# Patient Record
Sex: Male | Born: 1975 | Race: White | Hispanic: No | Marital: Single
Health system: Southern US, Academic
[De-identification: ages and names within clinical notes are randomized; demographics above are authoritative.]

## PROBLEM LIST (undated history)

## (undated) ENCOUNTER — Encounter: Attending: Oncology | Primary: Oncology

## (undated) ENCOUNTER — Telehealth

## (undated) ENCOUNTER — Telehealth: Attending: Hematology & Oncology | Primary: Hematology & Oncology

## (undated) ENCOUNTER — Ambulatory Visit

## (undated) ENCOUNTER — Encounter: Attending: Hematology & Oncology | Primary: Hematology & Oncology

## (undated) ENCOUNTER — Encounter

## (undated) ENCOUNTER — Encounter: Attending: Adult Health | Primary: Adult Health

## (undated) ENCOUNTER — Encounter: Payer: PRIVATE HEALTH INSURANCE | Attending: Hematology & Oncology | Primary: Hematology & Oncology

## (undated) ENCOUNTER — Ambulatory Visit: Payer: PRIVATE HEALTH INSURANCE

## (undated) ENCOUNTER — Ambulatory Visit: Attending: Surgery | Primary: Surgery

## (undated) ENCOUNTER — Encounter: Payer: PRIVATE HEALTH INSURANCE | Attending: Oncology | Primary: Oncology

## (undated) ENCOUNTER — Ambulatory Visit: Payer: PRIVATE HEALTH INSURANCE | Attending: Clinical | Primary: Clinical

## (undated) ENCOUNTER — Ambulatory Visit: Payer: PRIVATE HEALTH INSURANCE | Attending: Hematology & Oncology | Primary: Hematology & Oncology

## (undated) ENCOUNTER — Ambulatory Visit: Attending: Hematology & Oncology | Primary: Hematology & Oncology

## (undated) DIAGNOSIS — F32A Depression, unspecified: Secondary | ICD-10-CM

## (undated) DIAGNOSIS — Z87442 Personal history of urinary calculi: Secondary | ICD-10-CM

## (undated) DIAGNOSIS — F329 Major depressive disorder, single episode, unspecified: Secondary | ICD-10-CM

## (undated) DIAGNOSIS — K115 Sialolithiasis: Secondary | ICD-10-CM

## (undated) DIAGNOSIS — C801 Malignant (primary) neoplasm, unspecified: Secondary | ICD-10-CM

## (undated) DIAGNOSIS — K219 Gastro-esophageal reflux disease without esophagitis: Secondary | ICD-10-CM

## (undated) DIAGNOSIS — R55 Syncope and collapse: Secondary | ICD-10-CM

## (undated) DIAGNOSIS — M1990 Unspecified osteoarthritis, unspecified site: Secondary | ICD-10-CM

## (undated) DIAGNOSIS — H9313 Tinnitus, bilateral: Secondary | ICD-10-CM

## (undated) DIAGNOSIS — Z9289 Personal history of other medical treatment: Secondary | ICD-10-CM

## (undated) HISTORY — PX: LUNG REMOVAL, PARTIAL: SHX233

## (undated) HISTORY — PX: OTHER SURGICAL HISTORY: SHX169

## (undated) HISTORY — PX: NECK SURGERY: SHX720

## (undated) HISTORY — DX: Malignant (primary) neoplasm, unspecified: C80.1

---

## 1998-10-13 ENCOUNTER — Emergency Department (HOSPITAL_COMMUNITY): Admission: EM | Admit: 1998-10-13 | Discharge: 1998-10-13 | Payer: Self-pay | Admitting: Emergency Medicine

## 1998-10-14 ENCOUNTER — Encounter: Payer: Self-pay | Admitting: Emergency Medicine

## 2014-02-19 ENCOUNTER — Emergency Department (HOSPITAL_COMMUNITY)
Admission: EM | Admit: 2014-02-19 | Discharge: 2014-02-19 | Disposition: A | Discharge status: Home / Self Care | Payer: 59 | Attending: Emergency Medicine | Admitting: Emergency Medicine

## 2014-02-19 ENCOUNTER — Encounter (HOSPITAL_COMMUNITY): Payer: Self-pay | Admitting: Emergency Medicine

## 2014-02-19 ENCOUNTER — Telehealth: Payer: Self-pay | Admitting: Family Medicine

## 2014-02-19 ENCOUNTER — Emergency Department (HOSPITAL_COMMUNITY): Payer: 59

## 2014-02-19 DIAGNOSIS — R220 Localized swelling, mass and lump, head: Secondary | ICD-10-CM | POA: Diagnosis not present

## 2014-02-19 DIAGNOSIS — R55 Syncope and collapse: Secondary | ICD-10-CM | POA: Diagnosis not present

## 2014-02-19 DIAGNOSIS — R079 Chest pain, unspecified: Secondary | ICD-10-CM | POA: Diagnosis present

## 2014-02-19 DIAGNOSIS — Z87891 Personal history of nicotine dependence: Secondary | ICD-10-CM | POA: Insufficient documentation

## 2014-02-19 LAB — CBC WITH DIFFERENTIAL/PLATELET
BASOS ABS: 0 10*3/uL (ref 0.0–0.1)
Basophils Relative: 0 % (ref 0–1)
Eosinophils Absolute: 0 10*3/uL (ref 0.0–0.7)
Eosinophils Relative: 0 % (ref 0–5)
HCT: 41 % (ref 39.0–52.0)
HEMOGLOBIN: 14 g/dL (ref 13.0–17.0)
LYMPHS PCT: 22 % (ref 12–46)
Lymphs Abs: 1.4 10*3/uL (ref 0.7–4.0)
MCH: 28.8 pg (ref 26.0–34.0)
MCHC: 34.1 g/dL (ref 30.0–36.0)
MCV: 84.4 fL (ref 78.0–100.0)
Monocytes Absolute: 0.7 10*3/uL (ref 0.1–1.0)
Monocytes Relative: 10 % (ref 3–12)
NEUTROS ABS: 4.3 10*3/uL (ref 1.7–7.7)
Neutrophils Relative %: 68 % (ref 43–77)
Platelets: 196 10*3/uL (ref 150–400)
RBC: 4.86 MIL/uL (ref 4.22–5.81)
RDW: 12.9 % (ref 11.5–15.5)
WBC: 6.4 10*3/uL (ref 4.0–10.5)

## 2014-02-19 LAB — COMPREHENSIVE METABOLIC PANEL
ALT: 22 U/L (ref 0–53)
ANION GAP: 13 (ref 5–15)
AST: 28 U/L (ref 0–37)
Albumin: 4.4 g/dL (ref 3.5–5.2)
Alkaline Phosphatase: 74 U/L (ref 39–117)
BILIRUBIN TOTAL: 0.6 mg/dL (ref 0.3–1.2)
BUN: 16 mg/dL (ref 6–23)
CHLORIDE: 103 meq/L (ref 96–112)
CO2: 26 meq/L (ref 19–32)
Calcium: 9.4 mg/dL (ref 8.4–10.5)
Creatinine, Ser: 0.89 mg/dL (ref 0.50–1.35)
GFR calc Af Amer: >90 mL/min (ref >90)
GFR calc non Af Amer: >90 mL/min (ref >90)
GLUCOSE: 102 mg/dL — AB (ref 70–99)
Potassium: 3.5 mEq/L — ABNORMAL LOW (ref 3.7–5.3)
SODIUM: 142 meq/L (ref 137–147)
Total Protein: 7.5 g/dL (ref 6.0–8.3)

## 2014-02-19 LAB — TROPONIN I: Troponin I: <0.3 ng/mL (ref <0.30)

## 2014-02-19 LAB — D-DIMER, QUANTITATIVE: D-Dimer, Quant: <0.27 ug/mL-FEU (ref 0.00–0.48)

## 2014-02-19 MED ORDER — SODIUM CHLORIDE 0.9 % IV BOLUS (SEPSIS)
1000.0000 mL | Freq: Once | INTRAVENOUS | Status: AC
  Administered 2014-02-19: 1000 mL via INTRAVENOUS

## 2014-02-19 MED ORDER — IOHEXOL 300 MG/ML  SOLN
80.0000 mL | Freq: Once | INTRAMUSCULAR | Status: AC | PRN
  Administered 2014-02-19: 80 mL via INTRAVENOUS

## 2014-02-19 NOTE — Discharge Instructions (Signed)
Driving and Equipment Restrictions Some medical problems make it dangerous to drive, ride a bike, or use machines. Some of these problems are:  A hard blow to the head (concussion).  Passing out (fainting).  Twitching and shaking (seizures).  Low blood sugar.  Taking medicine to help you relax (sedatives).  Taking pain medicines.  Wearing an eye patch.  Wearing splints. This can make it hard to use parts of your body that you need to drive safely. HOME CARE   Do not drive until your doctor says it is okay.  Do not use machines until your doctor says it is okay. You may need a form signed by your doctor (medical release) before you can drive again. You may also need this form before you do other tasks where you need to be fully alert. MAKE SURE YOU:  Understand these instructions.  Will watch your condition.  Will get help right away if you are not doing well or get worse. Document Released: 06/03/2004 Document Revised: 07/19/2011 Document Reviewed: 09/03/2009 [address] [facility] Patient Information 2015 [place], [place]. This information is not intended to replace advice given to you by your health care provider. Make sure you discuss any questions you have with your health care provider.  Syncope Syncope is a medical term for fainting or passing out. This means you lose consciousness and drop to the ground. People are generally unconscious for less than 5 minutes. You may have some muscle twitches for up to 15 seconds before waking up and returning to normal. Syncope occurs more often in older adults, but it can happen to anyone. While most causes of syncope are not dangerous, syncope can be a sign of a serious medical problem. It is important to seek medical care.  CAUSES  Syncope is caused by a sudden drop in blood flow to the brain. The specific cause is often not determined. Factors that can bring on syncope include:  Taking medicines that lower blood pressure.  Sudden changes in  posture, such as standing up quickly.  Taking more medicine than prescribed.  Standing in one place for too long.  Seizure disorders.  Dehydration and excessive exposure to heat.  Low blood sugar (hypoglycemia).  Straining to have a bowel movement.  Heart disease, irregular heartbeat, or other circulatory problems.  Fear, emotional distress, seeing blood, or severe pain. SYMPTOMS  Right before fainting, you may:  Feel dizzy or light-headed.  Feel nauseous.  See all white or all black in your field of vision.  Have cold, clammy skin. DIAGNOSIS  Your health care provider will ask about your symptoms, perform a physical exam, and perform an electrocardiogram (ECG) to record the electrical activity of your heart. Your health care provider may also perform other heart or blood tests to determine the cause of your syncope which may include:  Transthoracic echocardiogram (TTE). During echocardiography, sound waves are used to evaluate how blood flows through your heart.  Transesophageal echocardiogram (TEE).  Cardiac monitoring. This allows your health care provider to monitor your heart rate and rhythm in real time.  Holter monitor. This is a portable device that records your heartbeat and can help diagnose heart arrhythmias. It allows your health care provider to track your heart activity for several days, if needed.  Stress tests by exercise or by giving medicine that makes the heart beat faster. TREATMENT  In most cases, no treatment is needed. Depending on the cause of your syncope, your health care provider may recommend changing or stopping some of  your medicines. HOME CARE INSTRUCTIONS  Have someone stay with you until you feel stable.  Do not drive, use machinery, or play sports until your health care provider says it is okay.  Keep all follow-up appointments as directed by your health care provider.  Lie down right away if you start feeling like you might faint.  Breathe deeply and steadily. Wait until all the symptoms have passed.  Drink enough fluids to keep your urine clear or pale yellow.  If you are taking blood pressure or heart medicine, get up slowly and take several minutes to sit and then stand. This can reduce dizziness. SEEK IMMEDIATE MEDICAL CARE IF:   You have a severe headache.  You have unusual pain in the chest, abdomen, or back.  You are bleeding from your mouth or rectum, or you have black or tarry stool.  You have an irregular or very fast heartbeat.  You have pain with breathing.  You have repeated fainting or seizure-like jerking during an episode.  You faint when sitting or lying down.  You have confusion.  You have trouble walking.  You have severe weakness.  You have vision problems. If you fainted, call your local emergency services (911 in U.S[place] Do not drive yourself to the hospital.  MAKE SURE YOU:  Understand these instructions.  Will watch your condition.  Will get help right away if you are not doing well or get worse. Document Released: 04/26/2005 Document Revised: 05/01/2013 Document Reviewed: 06/25/2011 [facility] Patient Information 2015 [place], [place]. This information is not intended to replace advice given to you by your health care provider. Make sure you discuss any questions you have with your health care provider.    Emergency Department Resource Guide 1) Find a Doctor and Pay Out of Pocket Although you won't have to find out who is covered by your insurance plan, it is a good idea to ask around and get recommendations. You will then need to call the office and see if the doctor you have chosen will accept you as a new patient and what types of options they offer for patients who are self-pay. Some doctors offer discounts or will set up payment plans for their patients who do not have insurance, but you will need to ask so you aren't surprised when you get to your appointment.  2) Contact  Your Local Health Department Not all health departments have doctors that can see patients for sick visits, but many do, so it is worth a call to see if yours does. If you don't know where your local health department is, you can check in your phone book. The CDC also has a tool to help you locate your state's health department, and many state websites also have listings of all of their local health departments.  3) Find a [facility] If your illness is not likely to be very severe or complicated, you may want to try a walk in clinic. These are popping up all over the country in pharmacies, drugstores, and shopping centers. They're usually staffed by nurse practitioners or physician assistants that have been trained to treat common illnesses and complaints. They're usually fairly quick and inexpensive. However, if you have serious medical issues or chronic medical problems, these are probably not your best option.  No Primary Care Doctor: - Call Health Connect at  [phone] - they can help you locate a primary care doctor that  accepts your insurance, provides certain services, etc. - Physician Referral Service- [phone]  Chronic Pain Problems: Organization         Address  Phone   Notes  [facility]  [phone] Patients need to be referred by their primary care doctor.   Medication Assistance: Organization         Address  Phone   Notes  [facility] Medication [facility] [place] [place], [place] [address] [phone] --Must be a resident of [facility] -- Must have NO insurance coverage whatsoever (no Medicaid/ Medicare, etc.) -- The pt. MUST have a primary care doctor that directs their care regularly and follows them in the community   MedAssist  [phone]   [employer]  [phone]    Agencies that provide inexpensive medical care: Organization         Address  Phone   Notes  [place]  [phone]   [facility]    [phone]   [facility] [place], [place] [address] [phone]   [place] [address] [place] [phone]   Planned Parenthood    [phone]   [facility]    [phone]   [address] [address], [place] Phone:  [phone], Fax:  [phone] Hours of Operation:  9 am - 6 pm, M-F.  Also accepts Medicaid/Medicare and self-pay.  [facility] for [place], [address], [place] Phone: [phone], Fax: [phone]. Hours of Operation:  8:30 am - 5:30 pm, M-F.  Also accepts Medicaid and self-pay.  [facility] [place] [address] [place] Phone: [phone]   [place], [place], [place] [phone], Ext. [phone] Mondays & Thursdays: 7-9 AM.  First 15 patients are seen on a first come, first serve basis.    [place] Providers:  Organization         Address  Phone   Notes  [facility] [address], [place] [phone] Also accepts self-pay patients.  [facility] [address], [place]  [phone]   [place], [address], [place] [phone]   [facility] [address] [place] [phone]   Lucianne Lei [address], [place]   [phone] Only accepts [place] Access [place] patients after they have their name applied to their card.   Self-Pay (no insurance) in [facility]:  Organization         Address  Phone   Notes  Sickle Cell Patients, [facility] [place] [phone]   [facility] [place] [phone]   [facility] [place]  [place],  [place], [place] [phone]   [facility]/Dr. Osei-Bonsu  [address] [place] or [address], [place] [phone] Phone number for both [place] and [place] locations is the same.  Urgent Medical and [facility] [address] [place] [phone]   [facility] [address] [place] or [address] [phone]   [facility] [address], [place] [phone], phone; [phone], fax Sees  patients 1st and 3rd Saturday of every month.  Must not qualify for public or private insurance (i.e. Medicaid, Medicare, [place] Health Choice, Veterans' Benefits)  Household income should be no more than 200% of the poverty level The clinic cannot treat you if you are pregnant or think you are pregnant  Sexually transmitted diseases are not treated at the clinic.    Dental Care: Organization         Address  Phone  Notes  [facility] Department of [place] [place] [phone] Accepts children up to age 5 who are enrolled in [place] or [place]; pregnant women with a Medicaid card; and children who have applied for Medicaid or [place] Health Choice, but were declined, whose parents can pay a reduced fee at time of service.  [facility] Department of [facility]  [address], [place] [phone] Accepts children up to age 47 who are enrolled in [place] or [place]; pregnant women with a Medicaid card; and children who have applied for Medicaid or [place] Health Choice, but were declined, whose parents can pay a reduced fee at time of service.  [place] Adult Dental Access PROGRAM  [place] [phone] Patients are seen by appointment only. Walk-ins are not accepted. [place] will see patients 97 years of age and older. Monday - Tuesday (8am-5pm) Most  Wednesdays (8:30-5pm) $30 per visit, cash only  [facility] Adult Dental Access PROGRAM  [address], [facility] [phone] Patients are seen by appointment only. Walk-ins are not accepted. [place] will see patients 33 years of age and older. One Wednesday Evening (Monthly: Volunteer Based).  $30 per visit, cash only  [place]  [phone] for adults; Children under age 38, call Graduate Pediatric Dentistry at [phone]. Children aged 63-14, please call [phone] to request a pediatric application.  Dental services are provided in all areas of dental care including fillings, crowns and bridges, complete and partial dentures, implants, gum treatment, root canals, and extractions. Preventive care is also provided. Treatment is provided to both adults and children. Patients are selected via a lottery and there is often a waiting list.   [facility] [address], [place]  [phone] [email or web address]   [facility] [address] [place], [place] [phone], Ext. [phone] Second and Fourth Thursday of each month, opens at 6:30 AM; Clinic ends at 9 AM.  Patients are seen on a first-come first-served basis, and a limited number are seen during each clinic.   [facility]  [address] Hillard Danker [place], [place] [phone]   Eligibility Requirements You must have lived in [place], [place], or [place] counties for at least the last three months.   You cannot be eligible for state or federal sponsored [employer], including [employer], [place], or [employer].   You generally cannot be eligible for healthcare insurance through your employer.    How to apply: Eligibility screenings are held every Tuesday and Wednesday afternoon from 1:00 pm until 4:00 pm. You do not need an appointment for the interview!  [facility] [address], [place], [place]     [place]  [place] Department  [place]  [phone]    Behavioral Health Resources in the Community: Intensive Outpatient  Development worker, international aid Health Services [address] [place], [place] [phone]   [facility] Outpatient [address] [place], [place]   ADS: Alcohol & Drug Svcs [address] [place] Barrera, [place]   [place] [address]  [place], [place] or [phone]   Substance Abuse Resources Organization         Address  Phone  Notes  Alcohol and Drug Services  [phone]   [place]  [phone]   The [place]   Chinita Pester  [phone]   Residential & Outpatient Substance Abuse Program  [phone]   Psychological Services Organization         Address  Phone  Notes  [facility] [place]  [place]  [phone]   [place] [address], [place] or [phone]    Mobile Crisis Teams Organization         Address  Phone  Notes  Therapeutic Alternatives, Mobile Crisis Care Unit  [phone]   [facility]ive Psychotherapeutic Services  [address] [place], [place]   [place] [address], [place] [phone]    Self-Help/Support Groups Organization         Address  Phone             Notes  [place]. of [place] - variety of support groups  [place] Call for more information  Narcotics Anonymous (NA), Caring Services [address], [employer] [place]  2 meetings at this location   Special educational needs teacher         Address  Phone  Notes  [facility] Residential Treatment [place],    [place]  [phone]   [facility]  [address] [place]  [phone], [place], [place]   [place], [place] [phone] Admissions: 8am-3pm M-F  Incentives Substance [place] [address]    [place], [place] [phone]   [facility] [address] [place], [place], [place]   [facility] [address]  [place], [place]   [facility]ight Programs - Intensive Outpatient [address] Kristeen [address], [place], [place]   [facility].,  [place], [place] [phone] or [phone]   [facility]idential Treatment Services (RTS) [address] [place], [place] Accepts Medicaid  [place] [address],  [place] [phone] Substance Abuse/Addiction Treatment   [facility] Organization         Address  Phone  Notes  [facility]  [phone]   Domenic Schwab, PhD [address] Arlis [place], [place]   [phone] or [phone]   [place], [place] [phone]   [place] [address], [place], [place] [phone] Insurance/Medicaid/sponsorship through [facility] and Families [address], [place]                                    [place], [place] [phone] [place] [address], [place] [phone]    Dr. Adele Schilder  (  [phone]   [facility] [place] Dept. 1) [address] [place] 2) [place] 3)  [address], [place] [phone]  [phone]   [place] [phone] or [phone] (After Hours)

## 2014-02-19 NOTE — ED Notes (Signed)
Pt c/oleft sided sharp cp today. Pt states he was bent over at work and had cp and confusion at 0900 this am. Denies visual disturbances/weakness. Pt alert and oriented x 4 with No neuro deficits at present. Pt also reports "blacking out" several times over the past year and right submandibular swollen lymph nodes x 4-5 months. Pt does not have pcp.

## 2014-02-19 NOTE — ED Provider Notes (Signed)
This chart was scribed for [place], DO by Edison Simon, ED Scribe. This patient was seen in room APA05/APA05.  TIME SEEN: 1204  CHIEF COMPLAINT: syncope  HPI: [name] is a 38 y.o. male with history of prior tobacco use who presents to the Emergency Department complaining of intermittent syncopal episodes 5-6 times this past year. He attributes 2 of these episodes to alcohol use. He states there is sometimes associated dizziness. He states that 3 nights ago, he woke at 0300 in the middle of his hallway and that the last thing he remembers before that is going to bed. He denies alcohol or drug use. Today, he states he kneeled down today to make some measurements at his work like he had been doing previously and suddenly became confused; he states he was not able to remember who he was or who his coworkers were and became confrontational. He recalls the episode now. He states it lasted no more than several minutes. He states his coworkers say he did not pass out or have seizure-like activity. He reports occasional associated sharp chest pain described like a "needle." He states nothing makes it worse or better including deep breathing and exertion. He reports some nausea.  He also reports a painful lump in his neck that has been present for 6 months. No history of PE or DVT, recent prolonged immobilization such as flight or hospitalization, fracture, surgery, trauma. He denies SOB, vomiting, diarrhea, cough, or fever. No bloody stool or melena. No numbness, tingling or focal weakness. No headache.   ROS: See HPI Constitutional: no fever  Eyes: no drainage  ENT: no runny nose, painful lump in his neck Cardiovascular:  chest pain  Resp: no SOB  GI: no vomiting, nausea GU: no dysuria Integumentary: no rash  Allergy: no hives  Musculoskeletal: bilateral leg swelling  Neurological: no slurred speech, syncope, confusion, dizziness ROS otherwise negative  PAST MEDICAL HISTORY/PAST SURGICAL  HISTORY:  History reviewed. No pertinent past medical history.  MEDICATIONS:  Prior to Admission medications   Not on File    ALLERGIES:  No Known Allergies  SOCIAL HISTORY:  History  Substance Use Topics  . Smoking status: Former Research scientist (life sciences)  . Smokeless tobacco: Not on file  . Alcohol Use: Yes    FAMILY HISTORY: No family history on file.  EXAM: BP 135/86  Pulse 75  Temp(Src) 98.7 F (37.1 C)  Resp 15  Ht 6' (1.829 m)  Wt 190 lb (86.183 kg)  BMI 25.76 kg/m2  SpO2 99% CONSTITUTIONAL: Alert and oriented and responds appropriately to questions. Well-appearing; well-nourished, nontoxic, smiling, pleasant HEAD: Normocephalic EYES: Conjunctivae clear, PERRL ENT: normal nose; no rhinorrhea; moist mucous membranes; pharynx without lesions noted NECK: Supple, no meningismus, non-moveable hard mass in the right submandibular area with no associated erythema or warmth or induration CARD: RRR; S1 and S2 appreciated; no murmurs, no clicks, no rubs, no gallops RESP: Normal chest excursion without splinting or tachypnea; breath sounds clear and equal bilaterally; no wheezes, no rhonchi, no rales,  ABD/GI: Normal bowel sounds; non-distended; soft, non-tender, no rebound, no guarding BACK:  The back appears normal and is non-tender to palpation, there is no CVA tenderness EXT: Normal ROM in all joints; non-tender to palpation; no edema; normal capillary refill; no cyanosis    SKIN: Normal color for age and race; warm NEURO: Moves all extremities equally, strength is 5/5 in all 4 extremities, sensation to fine touch normal diffusely, cranial nerves 2-12 intact, no dysmetrea in finger  to nose testing PSYCH: The patient's mood and manner are appropriate. Grooming and personal hygiene are appropriate.  MEDICAL DECISION MAKING: Patient here with episodes that he describes the syncopal events. He states he is not sure if he loses consciousness but on several occasions has become very confused.  There was no seizure-like activity during these episodes and there was no postictal period. Most of these episodes are not preceded by any symptoms but he did have some left chest pain that sounds very atypical after this episode today. He is currently neurologically intact. No recent head injury. No cardiac risk factors. He is PERC negative.  Labs ordered in triage been unremarkable including normal electrolytes, H./H., troponin. Chest x-ray is clear with normal cardiac silhouette. EKG shows no ischemic changes, interval changes, arrhythmia, delta wave, prolonged QT, [place].  Given he did have some chest pain after this event, will obtain d-dimer to rule out pulmonary embolus given he is low risk. We'll also obtain a head CT given there are some episodes of confusion but he is currently neurologically intact. We'll obtain a CT of his neck in this right submandibular mass.   ED PROGRESS: Patient's labs are unremarkable. Head CT negative. CT of his neck shows a swollen right submandibular salivary gland with stones likely inflammatory but malignancy cannot be occluded. We'll have him followup with ENT for further evaluation. No sign of infection. No fever or leukocytosis. D-dimer is negative. Discussed with patient I recommend he followup with a primary care physician who may refer him to cardiology versus neurology for further workup. Have advised him to not drive or operate machinery until he has been cleared. His events are very atypical to discuss with him he may need an outpatient echocardiogram versus MRI and EEG. He has no risk factors for ACS or pulmonary embolus. No family history of seizures. Have discussed return precautions and supportive care instructions. He verbalizes understanding and is comfortable with plan.      EKG Interpretation  Date/Time:  Tuesday February 19 2014 10:54:46 EDT Ventricular Rate:  72 PR Interval:  148 QRS Duration: 96 QT Interval:  402 QTC Calculation: 440 R  Axis:   78 Text Interpretation:  Sinus rhythm Confirmed by WARD,  DO, KRISTEN ([id number]) on 02/19/2014 11:24:55 AM          I personally performed the services described in this documentation, which was scribed in my presence. The recorded information has been reviewed and is accurate.    [place], DO 02/19/14 [phone]

## 2014-02-19 NOTE — ED Notes (Signed)
MD at bedside. 

## 2014-02-20 ENCOUNTER — Encounter: Payer: Self-pay | Admitting: Family

## 2014-02-20 ENCOUNTER — Ambulatory Visit (INDEPENDENT_AMBULATORY_CARE_PROVIDER_SITE_OTHER): Payer: 59 | Admitting: Family

## 2014-02-20 VITALS — BP 117/71 | HR 67 | Temp 97.2°F | Ht 72.0 in | Wt 195.6 lb

## 2014-02-20 DIAGNOSIS — R600 Localized edema: Secondary | ICD-10-CM

## 2014-02-20 DIAGNOSIS — R609 Edema, unspecified: Secondary | ICD-10-CM

## 2014-02-20 DIAGNOSIS — Z23 Encounter for immunization: Secondary | ICD-10-CM

## 2014-02-20 DIAGNOSIS — R55 Syncope and collapse: Secondary | ICD-10-CM

## 2014-02-20 DIAGNOSIS — K111 Hypertrophy of salivary gland: Secondary | ICD-10-CM

## 2014-02-20 DIAGNOSIS — Z09 Encounter for follow-up examination after completed treatment for conditions other than malignant neoplasm: Secondary | ICD-10-CM

## 2014-02-20 NOTE — Patient Instructions (Signed)

## 2014-02-20 NOTE — Telephone Encounter (Signed)
appt scheduled for today with [name]

## 2014-02-20 NOTE — Progress Notes (Signed)
   Subjective:    Patient ID: [name], male    DOB: 1976-04-17, 38 y.o.   MRN: [id number]  HPI Pt presents to the office to establish care and for hospital follow-up for intermittent syncopal episodes 5-6 times this past year. Pt states he was told to get a referral to a neurologists to rule out seizure activity. PT also had a CT done that shown a right submandibular salivary gland with stones and to r/u out malignancy.  *ED notes reviewed    Review of Systems  Constitutional: Negative.   HENT: Negative.   Respiratory: Negative.   Cardiovascular: Negative.   Gastrointestinal: Negative.   Endocrine: Negative.   Genitourinary: Negative.   Musculoskeletal: Negative.   Neurological: Negative.   Hematological: Negative.   Psychiatric/Behavioral: Negative.   All other systems reviewed and are negative.      Objective:   Physical Exam  Vitals reviewed. Constitutional: He is oriented to person, place, and time. He appears well-developed and well-nourished. No distress.  HENT:  Head: Normocephalic.  Right Ear: External ear normal.  Left Ear: External ear normal.  Mouth/Throat: Oropharynx is clear and moist.  Eyes: Pupils are equal, round, and reactive to light. Right eye exhibits no discharge. Left eye exhibits no discharge.  Neck: Normal range of motion. Neck supple. No thyromegaly present.  Cardiovascular: Normal rate, regular rhythm, normal heart sounds and intact distal pulses.   No murmur heard. Pulmonary/Chest: Effort normal and breath sounds normal. No respiratory distress. He has no wheezes.  Abdominal: Soft. Bowel sounds are normal. He exhibits no distension. There is no tenderness.  Musculoskeletal: Normal range of motion. He exhibits no edema and no tenderness.  Lymphadenopathy:    He has cervical adenopathy (right swollen gland).  Neurological: He is alert and oriented to person, place, and time. He has normal reflexes. No cranial nerve deficit.  Skin: Skin is  warm and dry. No rash noted. No erythema.  Psychiatric: He has a normal mood and affect. His behavior is normal. Judgment and thought content normal.    BP 117/71  Pulse 67  Temp(Src) 97.2 F (36.2 C) (Oral)  Ht 6' (1.829 m)  Wt 195 lb 9.6 oz (88.724 kg)  BMI 26.52 kg/m2       Assessment & Plan:  1. Syncope, unspecified syncope type - Ambulatory referral to Neurology  2. Salivary gland swelling - Ambulatory referral to ENT  3. Hospital discharge follow-up  -Pt to keep all appointments with specialists  -RTO prn -Falls precaution discussed  Evelina Dun, FNP

## 2014-02-20 NOTE — Addendum Note (Signed)
Addended by: Priscille Heidelberg on: 02/20/2014 02:50 PM   Modules accepted: Orders

## 2014-02-21 ENCOUNTER — Other Ambulatory Visit: Payer: Self-pay

## 2014-02-21 ENCOUNTER — Ambulatory Visit (HOSPITAL_COMMUNITY)
Admission: RE | Admit: 2014-02-21 | Discharge: 2014-02-21 | Disposition: A | Discharge status: Home / Self Care | Payer: 59 | Source: Ambulatory Visit | Attending: Pulmonary Disease | Admitting: Pulmonary Disease

## 2014-02-21 ENCOUNTER — Ambulatory Visit (INDEPENDENT_AMBULATORY_CARE_PROVIDER_SITE_OTHER): Payer: 59 | Admitting: Cardiovascular Disease

## 2014-02-21 ENCOUNTER — Encounter: Payer: Self-pay | Admitting: Cardiovascular Disease

## 2014-02-21 VITALS — BP 112/80 | HR 58 | Ht 72.0 in | Wt 190.0 lb

## 2014-02-21 DIAGNOSIS — R55 Syncope and collapse: Secondary | ICD-10-CM | POA: Insufficient documentation

## 2014-02-21 DIAGNOSIS — I517 Cardiomegaly: Secondary | ICD-10-CM

## 2014-02-21 DIAGNOSIS — R410 Disorientation, unspecified: Secondary | ICD-10-CM

## 2014-02-21 DIAGNOSIS — R079 Chest pain, unspecified: Secondary | ICD-10-CM | POA: Insufficient documentation

## 2014-02-21 DIAGNOSIS — Z87891 Personal history of nicotine dependence: Secondary | ICD-10-CM | POA: Diagnosis not present

## 2014-02-21 NOTE — Patient Instructions (Signed)
Your physician recommends that you schedule a follow-up appointment in: December after you have your EEG with Neurology   Your physician has requested that you have en exercise stress myoview. For further information please visit [email or web address]. Please follow instruction sheet, as given.         Thank you for choosing [facility] !

## 2014-02-21 NOTE — Progress Notes (Signed)
Patient ID: [name], male   DOB: 02-24-1976, 38 y.o.   MRN: [id number]       CARDIOLOGY CONSULT NOTE  Patient ID: [name] MRN: [id number] DOB/AGE: Jul 27, 1975 38 y.o.  Admit date: (Not on file) Primary Physician Redge Gainer, MD  Reason for Consultation: Syncope and chest pain  HPI: The patient is a 38 yr old male who recently starting seeing a primary care provider yesterday. He presented to the ED on 02/19/14 after sustaining a presyncopal episode associated with confusion. As per ED notes, he has had several presyncopal episodes within the past month. There is occasionally associated dizziness, but no associated chest pain or shortness of breath. He can tell when it is going to happen as he becomes diaphoretic shortly beforehand. The last time he completely lost consciousness was over a year ago. On the day of ED presentation, he remembered kneeling down at work to take some measurements and suddenly became confused, unable to remember who he was or who his coworkers were, and became confrontational. A head CT was performed which showed no acute intracranial pathology. There was no witnessed seizure activity and he did not lose consciousness at that time. He has also been experiencing sharp chest pain, not aggravated by exertion or inspiration. It is usually associated with nausea, but not vomiting, palpitations, lightheadedness or shortness of breath. The pain is located at the LV apex without radiation to the shoulder, neck, jaw, back or arm. He has had a painful swelling in his neck. CT neck at time of ED presentation demonstrated the following: "Swollen right submandibular gland containing prominent calcifications consistent with submandibular gland stones. Submandibular gland enlargement is most likely inflammatory. Malignancy cannot be entirely excluded." He saw an ENT specialist earlier today who said they are typical stones and did not appear to be malignant, and plans to  surgically excise the gland. ECG demonstrated normal sinus rhythm in the ED. Chest xray was unremarkable.  Echocardiogram performed today demonstrated normal LV systolic and diastolic function and regional wall motion, with normal valvular structures.  He had been drinking a Monster beverage every day but quit in the past 48 hours. He eats a Clif bar for breakfast and has tried to eat a healthy diet in the past year.  Soc: Adopted. Works as a Government social research officer. Used to smoke up to 1 ppd for 10 years but quit 3 years ago. He vapes. Denies drug use.  No Known Allergies  Current Outpatient Prescriptions  Medication Sig Dispense Refill  . fexofenadine (ALLEGRA) 180 MG tablet Take 180 mg by mouth daily. OTC      . ibuprofen (ADVIL,MOTRIN) 200 MG tablet Take 400 mg by mouth every 6 (six) hours as needed for moderate pain.      . Multiple Vitamin (MULTIVITAMIN WITH MINERALS) TABS tablet Take 1 tablet by mouth daily.       No current facility-administered medications for this visit.    No past medical history on file.  No past surgical history on file.  History   Social History  . Marital Status: Married    Spouse Name: N/A    Number of Children: N/A  . Years of Education: N/A   Occupational History  . Not on file.   Social History Main Topics  . Smoking status: Former Research scientist (life sciences)  . Smokeless tobacco: Not on file  . Alcohol Use: Yes  . Drug Use: Yes    Special: Marijuana  . Sexual Activity: Not on file  Other Topics Concern  . Not on file   Social History Narrative  . No narrative on file     No family history of premature CAD in 1st degree relatives.  Prior to Admission medications   Medication Sig Start Date End Date Taking? Authorizing Provider  fexofenadine (ALLEGRA) 180 MG tablet Take 180 mg by mouth daily. OTC    Historical Provider, MD  ibuprofen (ADVIL,MOTRIN) 200 MG tablet Take 400 mg by mouth every 6 (six) hours as needed for moderate pain.    Historical Provider, MD   Multiple Vitamin (MULTIVITAMIN WITH MINERALS) TABS tablet Take 1 tablet by mouth daily.    Historical Provider, MD     Review of systems complete and found to be negative unless listed above in HPI   BP 112/80  Pulse 58  Weight 190 lb (86.183 kg) Height 6' (1.829 m)   Physical exam General: NAD Neck: No JVD, no thyromegaly or thyroid nodule.  Lungs: Clear to auscultation bilaterally with normal respiratory effort. CV: Nondisplaced PMI. Regular rate and rhythm, normal S1/S2, no S3/S4, no murmur.  No peripheral edema.  No carotid bruit.  Normal pedal pulses.  Abdomen: Soft, nontender, no hepatosplenomegaly, no distention.  Skin: Intact without lesions or rashes.  Neurologic: Alert and oriented x 3.  Psych: Normal affect. Extremities: No clubbing or cyanosis.  HEENT: Normal.   ECG: Most recent ECG reviewed.  Labs:   Lab Results  Component Value Date   WBC 6.4 02/19/2014   HGB 14.0 02/19/2014   HCT 41.0 02/19/2014   MCV 84.4 02/19/2014   PLT 196 02/19/2014    Recent Labs Lab 02/19/14 1116  NA 142  K 3.5*  CL 103  CO2 26  BUN 16  CREATININE 0.89  CALCIUM 9.4  PROT 7.5  BILITOT 0.6  ALKPHOS 74  ALT 22  AST 28  GLUCOSE 102*   Lab Results  Component Value Date   TROPONINI <0.30 02/19/2014    No results found for this basename: CHOL   No results found for this basename: HDL   No results found for this basename: LDLCALC   No results found for this basename: TRIG   No results found for this basename: CHOLHDL   No results found for this basename: LDLDIRECT         Studies: Ct Head Wo Contrast  02/19/2014   CLINICAL DATA:  Left-sided chest pain. Black out. Swallow submandibular lymph glands.  EXAM: CT HEAD WITHOUT CONTRAST  CT NECK WITH CONTRAST  TECHNIQUE: Contiguous axial images were obtained from the base of the skull through the vertex without contrast. Multidetector CT imaging of the neck was performed using the standard protocol without  intravenous contrast.  COMPARISON:  None.  FINDINGS: CT HEAD FINDINGS  No intra-axial or extra-axial scratched pathologic fluid or blood collection identified. No mass lesion. No hydrocephalus. No hemorrhage. Orbits are unremarkable. No acute bony abnormality. Visualized paranasal sinuses and mastoids clear.  CT NECK FINDINGS  Base scratched of brain is unremarkable. Nasopharynx is clear. Tongue and tongue base unremarkable. Parotid glands are unremarkable. Left submandibular gland is normal. Right submandibular gland is enlarged with prominent calcifications consistent with stones. Parapharyngeal space is normal. Mild asymmetric soft tissue fullness along the left posterior pharynx is possibly related to phase of respiration / swallowing. Larynx is unremarkable. The trachea is unremarkable. Shotty bilateral cervical lymph nodes. Tiny focus of fat right thyroid lobe. Thyroid is otherwise unremarkable. Shotty mediastinal lymph nodes. Minimal prominence of soft tissue in  the anterior mediastinum most likely residual thymus. Vascular structures in the neck are widely patent. Retropharyngeal space normal. No acute bony abnormality.  IMPRESSION: 1. No acute intracranial abnormality. 2. Swollen right submandibular gland containing prominent calcifications consistent with submandibular gland stones. Submandibular gland enlargement is most likely inflammatory. Malignancy cannot be entirely excluded .   Electronically Signed   By: Capac   On: 02/19/2014 15:05   Ct Soft Tissue Neck W Contrast  02/19/2014   CLINICAL DATA:  Left-sided chest pain. Black out. Swallow submandibular lymph glands.  EXAM: CT HEAD WITHOUT CONTRAST  CT NECK WITH CONTRAST  TECHNIQUE: Contiguous axial images were obtained from the base of the skull through the vertex without contrast. Multidetector CT imaging of the neck was performed using the standard protocol without intravenous contrast.  COMPARISON:  None.  FINDINGS: CT HEAD FINDINGS   No intra-axial or extra-axial scratched pathologic fluid or blood collection identified. No mass lesion. No hydrocephalus. No hemorrhage. Orbits are unremarkable. No acute bony abnormality. Visualized paranasal sinuses and mastoids clear.  CT NECK FINDINGS  Base scratched of brain is unremarkable. Nasopharynx is clear. Tongue and tongue base unremarkable. Parotid glands are unremarkable. Left submandibular gland is normal. Right submandibular gland is enlarged with prominent calcifications consistent with stones. Parapharyngeal space is normal. Mild asymmetric soft tissue fullness along the left posterior pharynx is possibly related to phase of respiration / swallowing. Larynx is unremarkable. The trachea is unremarkable. Shotty bilateral cervical lymph nodes. Tiny focus of fat right thyroid lobe. Thyroid is otherwise unremarkable. Shotty mediastinal lymph nodes. Minimal prominence of soft tissue in the anterior mediastinum most likely residual thymus. Vascular structures in the neck are widely patent. Retropharyngeal space normal. No acute bony abnormality.  IMPRESSION: 1. No acute intracranial abnormality. 2. Swollen right submandibular gland containing prominent calcifications consistent with submandibular gland stones. Submandibular gland enlargement is most likely inflammatory. Malignancy cannot be entirely excluded .   Electronically Signed   By: Marcello Moores  Register   On: 02/19/2014 15:05    ASSESSMENT AND PLAN:  1. Episodic confusion with preyncope: He has not completely lost consciousness in over a year. There appear to be features of seizure-like activity, perhaps absence/petit mal seizures. No features of a grand mal seizure have been witnessed thus far. He is scheduled to see a neurologist on November 20, and I would imagine they will obtain an EEG. If a thorough neurologic workup is entirely normal, I would consider event monitoring to evaluate for bradyarrhythmias.  2. Chest pain: Other than a prior  history of tobacco use, there are no additional readily identifiable cardiovascular risk factors. He is adopted so family history is uncertain. I will obtain a lipid profile. I will proceed with an exercise Cardiolite for further clarification.  Dispo: f/u in December after neurology evaluation.  Signed: Kate Sable, M.D., F.A.C.C.  02/21/2014, 12:41 PM

## 2014-02-21 NOTE — Progress Notes (Signed)
  Echocardiogram 2D Echocardiogram has been performed.  [place], [place] 02/21/2014, 2:34 PM

## 2014-02-21 NOTE — Addendum Note (Signed)
Addended by: Barbarann Ehlers A on: 02/21/2014 04:15 PM   Modules accepted: Orders

## 2014-02-22 ENCOUNTER — Ambulatory Visit (HOSPITAL_COMMUNITY)
Admission: RE | Admit: 2014-02-22 | Discharge: 2014-02-22 | Disposition: A | Discharge status: Home / Self Care | Payer: 59 | Source: Ambulatory Visit | Attending: Pulmonary Disease | Admitting: Pulmonary Disease

## 2014-02-22 ENCOUNTER — Encounter (HOSPITAL_COMMUNITY): Payer: Self-pay

## 2014-02-22 ENCOUNTER — Encounter (HOSPITAL_COMMUNITY): Payer: 59

## 2014-02-22 DIAGNOSIS — R55 Syncope and collapse: Secondary | ICD-10-CM | POA: Diagnosis not present

## 2014-02-22 DIAGNOSIS — R079 Chest pain, unspecified: Secondary | ICD-10-CM | POA: Insufficient documentation

## 2014-02-22 LAB — LIPID PANEL
CHOLESTEROL: 160 mg/dL (ref 0–200)
HDL: 51 mg/dL (ref >39)
LDL Cholesterol: 79 mg/dL (ref 0–99)
Total CHOL/HDL Ratio: 3.1 Ratio
Triglycerides: 149 mg/dL (ref <150)
VLDL: 30 mg/dL (ref 0–40)

## 2014-02-22 NOTE — Progress Notes (Addendum)
Stress Lab Nurses Notes - [facility] R. Larin 02/22/2014 Reason for doing test: Chest Pain and Syncope Type of test: Regular GTX Nurse performing test: Gerrit Halls, RN Nuclear Medicine Tech: Not Applicable Echo Tech: Not Applicable MD performing test: Gaynelle Cage MD: Dr.Moore Test explained and consent signed: Yes.   IV started: No IV started Symptoms: fatigue in legs Treatment/Intervention: None Reason test stopped: fatigue After recovery IV was: na Patient to return to Nuc. Med at : NA Patient discharged: Home Patient's Condition upon discharge was: stable Comments: During test peak BP 190/74 & HR 181 .  Recovery BP 127/76 & HR 96. Symptoms resolved in recovery. Geanie Cooley T  ATTENDING PHYSICIAN ADDENDUM: Resting ECG demonstrated normal sinus rhythm. With exercise, there were no ischemic ST-T changes, nor any arrhythmias. The patient did not experience any chest pain. Exercised for 9:16 on the Bruce protocol, achieving a work level of 10.9 METS. A Duke treadmill score of 9 predicts a low risk of future cardiac events (0.9% 1-yr mortality).

## 2014-02-25 ENCOUNTER — Telehealth: Payer: Self-pay | Admitting: *Deleted

## 2014-02-25 NOTE — Telephone Encounter (Signed)
Pt aware, forwarded to Dr. Laurance Flatten

## 2014-02-25 NOTE — Telephone Encounter (Signed)
Message copied by Desma Mcgregor on Mon Feb 25, 2014 12:16 PM ------      Message from: Kate Sable A      Created: Mon Feb 25, 2014  8:45 AM       Please inform pt of normal results. ------

## 2014-03-29 ENCOUNTER — Ambulatory Visit (INDEPENDENT_AMBULATORY_CARE_PROVIDER_SITE_OTHER): Payer: 59 | Admitting: Neurology

## 2014-03-29 ENCOUNTER — Encounter: Payer: Self-pay | Admitting: Neurology

## 2014-03-29 VITALS — BP 110/68 | HR 70 | Ht 72.0 in | Wt 206.5 lb

## 2014-03-29 DIAGNOSIS — R404 Transient alteration of awareness: Secondary | ICD-10-CM

## 2014-03-29 DIAGNOSIS — R55 Syncope and collapse: Secondary | ICD-10-CM
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
## 2014-04-01 ENCOUNTER — Encounter: Payer: Self-pay | Admitting: Neurology

## 2014-04-10 ENCOUNTER — Ambulatory Visit (HOSPITAL_COMMUNITY)
Admission: RE | Admit: 2014-04-10 | Discharge: 2014-04-10 | Disposition: A | Discharge status: Home / Self Care | Payer: 59 | Source: Ambulatory Visit | Attending: Neurology | Admitting: Neurology

## 2014-04-10 DIAGNOSIS — R404 Transient alteration of awareness: Secondary | ICD-10-CM | POA: Diagnosis not present

## 2014-04-10 DIAGNOSIS — R55 Syncope and collapse: Secondary | ICD-10-CM | POA: Insufficient documentation

## 2014-04-10 MED ORDER — GADOBENATE DIMEGLUMINE 529 MG/ML IV SOLN
19.0000 mL | Freq: Once | INTRAVENOUS | Status: AC | PRN
  Administered 2014-04-10: 19 mL via INTRAVENOUS

## 2014-04-15 ENCOUNTER — Telehealth: Payer: Self-pay | Admitting: Neurology

## 2014-04-15 NOTE — Telephone Encounter (Signed)
Pt is out of town and cant make his 04-16-14 and will resch

## 2014-04-16 ENCOUNTER — Other Ambulatory Visit: Payer: 59

## 2014-05-02 ENCOUNTER — Encounter: Payer: Self-pay | Admitting: *Deleted

## 2014-05-06 ENCOUNTER — Ambulatory Visit: Payer: 59 | Admitting: Cardiovascular Disease

## 2014-05-06 ENCOUNTER — Encounter: Payer: Self-pay | Admitting: Cardiovascular Disease

## 2014-06-10 ENCOUNTER — Other Ambulatory Visit: Payer: 59

## 2014-06-10 DIAGNOSIS — K115 Sialolithiasis: Secondary | ICD-10-CM

## 2014-06-10 HISTORY — DX: Sialolithiasis: K11.5

## 2014-06-28 ENCOUNTER — Ambulatory Visit: Payer: 59 | Admitting: Neurology

## 2014-06-28 DIAGNOSIS — Z029 Encounter for administrative examinations, unspecified: Secondary | ICD-10-CM

## 2014-07-03 ENCOUNTER — Other Ambulatory Visit: Payer: Self-pay | Admitting: Otolaryngology

## 2014-07-03 ENCOUNTER — Other Ambulatory Visit (HOSPITAL_COMMUNITY): Payer: Self-pay | Admitting: *Deleted

## 2014-07-03 NOTE — H&P (Signed)
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  His family physician is concerned these may be seizures.  He is scheduled to see a neurologist in one month.  He does not think he is having heart rhythm problems nor blood pressure issues.  The spells are not necessarily orthostatic.  He may have some vague premonition that an attack is coming.  No change in hearing.  No vertigo.  He was monitored in the emergency room for 10-12 hours but has not had a Holter monitor or similar.  No obvious relationship to meals.  He did have CT scans of various body parts including head and neck.  There is a large calcific stone in the RIGHT submandibular gland posteriorly.  No obvious stones along the duct.   He has been aware of some slight fullness in the RIGHT upper neck as much as 9 years.  It may be getting slowly larger.  This past year, it is slightly tender to touch, and also some pain with sour foods such as pickles.  No obvious swelling with meals or foods.  He is not diabetic.  No relationship to meals.  The gland has never gotten large and stayed enlarged for several days.  No fever.  No bad taste in the mouth.  No xerostomia.  4 months recheck.  Preoperative evaluation.  We are preparing to perform a RIGHT submandibular gland excision for intraglandular stones.  He continues to have a swollen gland and some tenderness at various times but no obvious swelling with meals.  He has been cleared by both cardiology and neurology for his presumed syncopal episodes.   I discussed the surgery in detail including risks and complications.  Questions were answered and informed consent was obtained.  I discussed postoperative pain control, advancement of diet and activity, and return to work.  I gave him a prescription for Tylenol with codeine pills, and discussed  use of ibuprofen and/or Tylenol by itself for postoperative pain.  I will see him back 10 days after surgery for suture removal.  PMH:No past medical history on file.  Surg Hx: Past Surgical History  Procedure Laterality Date  . None      FHx:   Family History  Problem Relation Age of Onset  . Adopted: Yes  . Family history unknown: Yes   SocHx:  reports that he quit smoking about 3 years ago. He has never used smokeless tobacco. He reports that he drinks alcohol. He reports that he uses illicit drugs (Marijuana).  ALLERGIES:  Allergies  Allergen Reactions  . Hydrocodone      (Not in a hospital admission)  No results found for this or any previous visit (from the past 48 hour(s)). No results found.  KNL:ZJQBHALP: Feeling tired (fatigue).  No fever.  Night sweats.  No recent weight loss. Head: Headache. Eyes: No eye symptoms. Otolaryngeal: No hearing loss  and no earache.  Tinnitus.  No purulent nasal discharge.  No nasal passage blockage (stuffiness), no snoring, no sneezing, no hoarseness, and no sore throat. Cardiovascular: Chest pain or discomfort.  No palpitations. Pulmonary: No dyspnea, no cough, and no wheezing. Gastrointestinal: No dysphagia.  Heartburn.  No nausea, no abdominal pain, and no melena.  No diarrhea. Genitourinary: No dysuria. Endocrine: No muscle weakness. Musculoskeletal: Calf muscle cramps.  No arthralgias.  Soft tissue swelling. Neurological: No dizziness.  Fainting, tingling, and numbness. Psychological: No anxiety  and no depression. Skin: No rash.  BP:119/71,  HR: 76 b/min,  Height: 6 ft , Weight: 190 lb , BMI: 25.8 kg/m2,   PHYSICAL EXAM: He appears healthy.  Mental status is sharp.  He hears well in conversational speech.  Voice is clear and respirations unlabored through the nose.  The head is atraumatic and neck supple.  Cranial nerves intact.  Ear canals are clear with normal drums.  Anterior nose is moist and patent.  Oral cavity is  moist with teeth in fair to good repair.  There is clear saliva from the RIGHT Wharton's duct with compression of the gland.  Oropharynx is clear.  Neck with enlargement of the RIGHT submandibular gland which is quite firm.  On bi-digital palpation, the posterior aspect of the gland is firm consistent with a stone.  No obvious stones along the Wharton's duct course.  No adenopathy.  He is trim and healthy appearing.  Mental status is sharp.  He has a full beard.  The oral cavity is moist with no visible or palpable stones.  The RIGHT submandibular gland is firm and enlarged but not fixed.  No other neck masses.  Lingual, hypoglossal, and ramus mandibularis nerves are intact on both sides.   Lungs: Clear to auscultation Heart: Regular rate and rhythm without murmurs Abdomen: Soft, active Extremities: Normal configuration Neurologic: Symmetric, grossly intact.  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  You have salivary stones in the RIGHT submandibular gland.  We need to remove the entire gland to get rid of these.  We are scheduled to do this in the hospital.  This will take 1-1.5 hours and you will stay overnight one night.  I will leave you a  prescription for some codeine for pain relief afterwards if Tylenol and ibuprofen are not sufficient.  No aspirin for 10 days before surgery.  You may resume this after surgery.  If you are going to shave with a razor, I would like you to take the  beard off 2 or 3 days before the surgery.  If you are going to use an Copy or electric clippers, you can do this anytime.  I would like you to buy some Hibiclens (chlorhexidine) solution at the drug store and shower with this including washing your neck and face the night before surgery, and again the morning of surgery.  I will see you back here in the office 10 days after surgery.  No strenuous activity for 2 weeks after  surgery.  Acetaminophen-Codeine #3 300-30 MG Oral Tablet;TAKE 1 TO 2 TABLETS EVERY 4 TO 6 HOURS AS NEEDED FOR PAIN; SVX79; R0; Rx.  Erik Obey, Haylei Cobin 3/90/3009, 1:28 PM

## 2014-07-03 NOTE — Progress Notes (Signed)
No pre-op orders in EPIC. Called Dr. Noreene Filbert office and left message on Amy's (his nurse) voicemail requesting pre-op orders.

## 2014-07-04 ENCOUNTER — Encounter (HOSPITAL_COMMUNITY): Payer: Self-pay

## 2014-07-04 ENCOUNTER — Encounter (HOSPITAL_COMMUNITY)
Admission: RE | Admit: 2014-07-04 | Discharge: 2014-07-04 | Disposition: A | Discharge status: Home / Self Care | Payer: 59 | Source: Ambulatory Visit | Attending: Otolaryngology | Admitting: Otolaryngology

## 2014-07-04 DIAGNOSIS — K115 Sialolithiasis: Secondary | ICD-10-CM | POA: Insufficient documentation

## 2014-07-04 DIAGNOSIS — Z01812 Encounter for preprocedural laboratory examination: Secondary | ICD-10-CM | POA: Insufficient documentation

## 2014-07-04 DIAGNOSIS — Z87891 Personal history of nicotine dependence: Secondary | ICD-10-CM | POA: Insufficient documentation

## 2014-07-04 HISTORY — DX: Personal history of other medical treatment: Z92.89

## 2014-07-04 LAB — CBC
HCT: 44.5 % (ref 39.0–52.0)
Hemoglobin: 15.2 g/dL (ref 13.0–17.0)
MCH: 29.5 pg (ref 26.0–34.0)
MCHC: 34.2 g/dL (ref 30.0–36.0)
MCV: 86.2 fL (ref 78.0–100.0)
PLATELETS: 192 10*3/uL (ref 150–400)
RBC: 5.16 MIL/uL (ref 4.22–5.81)
RDW: 13.3 % (ref 11.5–15.5)
WBC: 7.3 10*3/uL (ref 4.0–10.5)

## 2014-07-04 LAB — BASIC METABOLIC PANEL
Anion gap: 5 (ref 5–15)
BUN: 10 mg/dL (ref 6–23)
CHLORIDE: 104 mmol/L (ref 96–112)
CO2: 28 mmol/L (ref 19–32)
Calcium: 9.4 mg/dL (ref 8.4–10.5)
Creatinine, Ser: 0.95 mg/dL (ref 0.50–1.35)
GFR calc Af Amer: >90 mL/min (ref >90)
GLUCOSE: 97 mg/dL (ref 70–99)
POTASSIUM: 4.1 mmol/L (ref 3.5–5.1)
SODIUM: 137 mmol/L (ref 135–145)

## 2014-07-04 NOTE — Pre-Procedure Instructions (Signed)
[name]  07/04/2014   Your procedure is scheduled on:  07/12/2014  Report to [facility] Admitting at 06:30 AM.  Call this number if you have problems the morning of surgery: [phone]   Remember:   Do not eat food or drink liquids after midnight.  On Thursday   Take these medicines the morning of surgery with A SIP OF WATER: NOTHING   Do not wear jewelry  Do not wear lotions, powders, or perfumes. You may wear deodorant.             Men may shave face and neck.  Do not bring valuables to the hospital.  [facility] is not responsible                  for any belongings or valuables.               Contacts, dentures or bridgework may not be worn into surgery.   Leave suitcase in the car. After surgery it may be brought to your room.   For patients admitted to the hospital, discharge time is determined by your                treatment team.               Patients discharged the day of surgery will not be allowed to drive  home.  Name and phone number of your driver: /w [name]  Special Instructions: Special Instructions: [facility] - Preparing for Surgery  Before surgery, you can play an important role.  Because skin is not sterile, your skin needs to be as free of germs as possible.  You can reduce the number of germs on you skin by washing with CHG (chlorahexidine gluconate) soap before surgery.  CHG is an antiseptic cleaner which kills germs and bonds with the skin to continue killing germs even after washing.  Please DO NOT use if you have an allergy to CHG or antibacterial soaps.  If your skin becomes reddened/irritated stop using the CHG and inform your nurse when you arrive at Short Stay.  Do not shave (including legs and underarms) for at least 48 hours prior to the first CHG shower.  You may shave your face.  Please follow these instructions carefully:   1.  Shower with CHG Soap the night before surgery and the  morning of Surgery.  2.  If you choose to  wash your hair, wash your hair first as usual with your  normal shampoo.  3.  After you shampoo, rinse your hair and body thoroughly to remove the  Shampoo.  4.  Use CHG as you would any other liquid soap.  You can apply chg directly to the skin and wash gently with scrungie or a clean washcloth.  5.  Apply the CHG Soap to your body ONLY FROM THE NECK DOWN.    Do not use on open wounds or open sores.  Avoid contact with your eyes, ears, mouth and genitals (private parts).  Wash genitals (private parts)   with your normal soap.  6.  Wash thoroughly, paying special attention to the area where your surgery will be performed.  7.  Thoroughly rinse your body with warm water from the neck down.  8.  DO NOT shower/wash with your normal soap after using and rinsing off   the CHG Soap.  9.  Pat yourself dry with a clean towel.  10.  Wear clean pajamas.            11.  Place clean sheets on your bed the night of your first shower and do not sleep with pets.  Day of Surgery  Do not apply any lotions/deodorants the morning of surgery.  Please wear clean clothes to the hospital/surgery center.   Please read over the following fact sheets that you were given: Pain Booklet, Coughing and Deep Breathing and Surgical Site Infection Prevention

## 2014-07-04 NOTE — Progress Notes (Signed)
Call to Pharmacy tech to complete med. reconciliation

## 2014-07-08 ENCOUNTER — Telehealth: Payer: Self-pay | Admitting: Neurology

## 2014-07-08 NOTE — Telephone Encounter (Signed)
Pt no showed 06/28/14 appt w/ Dr. Delice Lesch for follow up. No show letter + policy mailed to pt / Sherri S.     Note to [facility] - send no show letter + no show policy, enter charge

## 2014-07-09 DIAGNOSIS — C801 Malignant (primary) neoplasm, unspecified: Secondary | ICD-10-CM

## 2014-07-09 HISTORY — DX: Malignant (primary) neoplasm, unspecified: C80.1

## 2014-07-11 MED ORDER — CHLORHEXIDINE GLUCONATE 4 % EX LIQD
1.0000 "application " | Freq: Once | CUTANEOUS | Status: DC
  Filled 2014-07-11: qty 15

## 2014-07-11 MED ORDER — CEFAZOLIN SODIUM-DEXTROSE 2-3 GM-% IV SOLR
2.0000 g | INTRAVENOUS | Status: AC
  Administered 2014-07-12: 2 g via INTRAVENOUS
  Filled 2014-07-11: qty 50

## 2014-07-12 ENCOUNTER — Ambulatory Visit (HOSPITAL_COMMUNITY): Payer: 59 | Admitting: Anesthesiology

## 2014-07-12 ENCOUNTER — Encounter (HOSPITAL_COMMUNITY): Payer: Self-pay | Admitting: *Deleted

## 2014-07-12 ENCOUNTER — Encounter (HOSPITAL_COMMUNITY)
Admission: RE | Disposition: A | Discharge status: Home / Self Care | Payer: Self-pay | Source: Ambulatory Visit | Attending: Otolaryngology

## 2014-07-12 ENCOUNTER — Observation Stay (HOSPITAL_COMMUNITY)
Admission: RE | Admit: 2014-07-12 | Discharge: 2014-07-13 | Disposition: A | Discharge status: Home / Self Care | Payer: 59 | Source: Ambulatory Visit | Attending: Otolaryngology | Admitting: Otolaryngology

## 2014-07-12 DIAGNOSIS — K115 Sialolithiasis: Secondary | ICD-10-CM | POA: Diagnosis present

## 2014-07-12 DIAGNOSIS — C080 Malignant neoplasm of submandibular gland: Principal | ICD-10-CM | POA: Insufficient documentation

## 2014-07-12 HISTORY — PX: SUBMANDIBULAR GLAND EXCISION: SHX2456

## 2014-07-12 SURGERY — EXCISION, SUBMANDIBULAR GLAND
Anesthesia: General | Laterality: Right

## 2014-07-12 MED ORDER — OXYCODONE HCL 5 MG PO TABS: 5.0000 mg | ORAL_TABLET | Freq: Once | ORAL | Status: DC | PRN

## 2014-07-12 MED ORDER — LACTATED RINGERS IV SOLN
INTRAVENOUS | Status: DC | PRN
  Administered 2014-07-12 (×2): via INTRAVENOUS

## 2014-07-12 MED ORDER — IBUPROFEN 100 MG/5ML PO SUSP
400.0000 mg | Freq: Four times a day (QID) | ORAL | Status: DC | PRN
  Administered 2014-07-12: 400 mg via ORAL
  Filled 2014-07-12 (×4): qty 20

## 2014-07-12 MED ORDER — ONDANSETRON HCL 4 MG/2ML IJ SOLN
INTRAMUSCULAR | Status: DC | PRN
  Administered 2014-07-12: 4 mg via INTRAVENOUS

## 2014-07-12 MED ORDER — HYDROMORPHONE HCL 1 MG/ML IJ SOLN
0.2500 mg | INTRAMUSCULAR | Status: DC | PRN
  Administered 2014-07-12: 0.5 mg via INTRAVENOUS

## 2014-07-12 MED ORDER — ACETAMINOPHEN-CODEINE #3 300-30 MG PO TABS: 1.0000 | ORAL_TABLET | ORAL | Status: DC | PRN

## 2014-07-12 MED ORDER — BACITRACIN ZINC 500 UNIT/GM EX OINT
1.0000 "application " | TOPICAL_OINTMENT | Freq: Two times a day (BID) | CUTANEOUS | Status: DC
  Administered 2014-07-12 – 2014-07-13 (×2): 1 via TOPICAL
  Filled 2014-07-12: qty 28.35

## 2014-07-12 MED ORDER — OXYCODONE HCL 5 MG/5ML PO SOLN
5.0000 mg | Freq: Once | ORAL | Status: DC | PRN
Start: 2014-07-12 — End: 2014-07-12

## 2014-07-12 MED ORDER — SUCCINYLCHOLINE CHLORIDE 20 MG/ML IJ SOLN
INTRAMUSCULAR | Status: DC | PRN
  Administered 2014-07-12: 100 mg via INTRAVENOUS

## 2014-07-12 MED ORDER — FENTANYL CITRATE 0.05 MG/ML IJ SOLN
INTRAMUSCULAR | Status: AC
  Filled 2014-07-12: qty 5

## 2014-07-12 MED ORDER — LIDOCAINE HCL (CARDIAC) 20 MG/ML IV SOLN
INTRAVENOUS | Status: DC | PRN
  Administered 2014-07-12: 40 mg via INTRAVENOUS

## 2014-07-12 MED ORDER — PROMETHAZINE HCL 25 MG/ML IJ SOLN: 6.2500 mg | INTRAMUSCULAR | Status: DC | PRN

## 2014-07-12 MED ORDER — LIDOCAINE-EPINEPHRINE 1 %-1:100000 IJ SOLN
INTRAMUSCULAR | Status: DC | PRN
  Administered 2014-07-12: 20 mL

## 2014-07-12 MED ORDER — LACTATED RINGERS IV SOLN
INTRAVENOUS | Status: DC
  Administered 2014-07-12: 07:00:00 via INTRAVENOUS

## 2014-07-12 MED ORDER — LIDOCAINE-EPINEPHRINE 1 %-1:100000 IJ SOLN
INTRAMUSCULAR | Status: AC
  Filled 2014-07-12: qty 1

## 2014-07-12 MED ORDER — 0.9 % SODIUM CHLORIDE (POUR BTL) OPTIME
TOPICAL | Status: DC | PRN
  Administered 2014-07-12: 1000 mL

## 2014-07-12 MED ORDER — FENTANYL CITRATE 0.05 MG/ML IJ SOLN
INTRAMUSCULAR | Status: DC | PRN
  Administered 2014-07-12: 25 ug via INTRAVENOUS
  Administered 2014-07-12: 50 ug via INTRAVENOUS
  Administered 2014-07-12 (×2): 25 ug via INTRAVENOUS
  Administered 2014-07-12: 75 ug via INTRAVENOUS
  Administered 2014-07-12: 50 ug via INTRAVENOUS

## 2014-07-12 MED ORDER — ONDANSETRON HCL 4 MG/2ML IJ SOLN
INTRAMUSCULAR | Status: AC
  Filled 2014-07-12: qty 2

## 2014-07-12 MED ORDER — LIDOCAINE HCL (CARDIAC) 20 MG/ML IV SOLN
INTRAVENOUS | Status: AC
  Filled 2014-07-12: qty 5

## 2014-07-12 MED ORDER — ONDANSETRON HCL 4 MG/2ML IJ SOLN: 4.0000 mg | INTRAMUSCULAR | Status: DC | PRN

## 2014-07-12 MED ORDER — HYDROMORPHONE HCL 1 MG/ML IJ SOLN
INTRAMUSCULAR | Status: AC
  Filled 2014-07-12: qty 1

## 2014-07-12 MED ORDER — MIDAZOLAM HCL 5 MG/5ML IJ SOLN
INTRAMUSCULAR | Status: DC | PRN
  Administered 2014-07-12: 2 mg via INTRAVENOUS

## 2014-07-12 MED ORDER — PROMETHAZINE HCL 25 MG/ML IJ SOLN
INTRAMUSCULAR | Status: AC
  Filled 2014-07-12: qty 1

## 2014-07-12 MED ORDER — MORPHINE SULFATE 2 MG/ML IJ SOLN: 1.0000 mg | INTRAMUSCULAR | Status: DC | PRN

## 2014-07-12 MED ORDER — DEXTROSE-NACL 5-0.45 % IV SOLN
INTRAVENOUS | Status: DC
  Administered 2014-07-12 (×2): via INTRAVENOUS

## 2014-07-12 MED ORDER — MIDAZOLAM HCL 2 MG/2ML IJ SOLN
INTRAMUSCULAR | Status: AC
  Filled 2014-07-12: qty 2

## 2014-07-12 MED ORDER — ONDANSETRON HCL 4 MG PO TABS: 4.0000 mg | ORAL_TABLET | ORAL | Status: DC | PRN

## 2014-07-12 MED ORDER — ARTIFICIAL TEARS OP OINT
TOPICAL_OINTMENT | OPHTHALMIC | Status: AC
  Filled 2014-07-12: qty 3.5

## 2014-07-12 MED ORDER — PROPOFOL 10 MG/ML IV BOLUS
INTRAVENOUS | Status: DC | PRN
  Administered 2014-07-12: 200 mg via INTRAVENOUS

## 2014-07-12 MED ORDER — BACITRACIN ZINC 500 UNIT/GM EX OINT
TOPICAL_OINTMENT | CUTANEOUS | Status: DC | PRN
  Administered 2014-07-12: 1 via TOPICAL

## 2014-07-12 MED ORDER — ARTIFICIAL TEARS OP OINT
TOPICAL_OINTMENT | OPHTHALMIC | Status: DC | PRN
  Administered 2014-07-12: 1 via OPHTHALMIC

## 2014-07-12 MED ORDER — PROPOFOL 10 MG/ML IV BOLUS
INTRAVENOUS | Status: AC
  Filled 2014-07-12: qty 20

## 2014-07-12 MED ORDER — SUCCINYLCHOLINE CHLORIDE 20 MG/ML IJ SOLN
INTRAMUSCULAR | Status: AC
  Filled 2014-07-12: qty 1

## 2014-07-12 MED ORDER — ROCURONIUM BROMIDE 50 MG/5ML IV SOLN
INTRAVENOUS | Status: AC
  Filled 2014-07-12: qty 1

## 2014-07-12 MED ORDER — BACITRACIN ZINC 500 UNIT/GM EX OINT
TOPICAL_OINTMENT | CUTANEOUS | Status: AC
  Filled 2014-07-12: qty 28.35

## 2014-07-12 SURGICAL SUPPLY — 47 items
ATTRACTOMAT 16X20 MAGNETIC DRP (DRAPES) IMPLANT
BLADE SURG ROTATE 9660 (MISCELLANEOUS) IMPLANT
CANISTER SUCTION 2500CC (MISCELLANEOUS) ×2 IMPLANT
CLEANER TIP ELECTROSURG 2X2 (MISCELLANEOUS) ×2 IMPLANT
CONT SPEC 4OZ CLIKSEAL STRL BL (MISCELLANEOUS) ×2 IMPLANT
CORDS BIPOLAR (ELECTRODE) IMPLANT
COVER SURGICAL LIGHT HANDLE (MISCELLANEOUS) ×2 IMPLANT
CRADLE DONUT ADULT HEAD (MISCELLANEOUS) IMPLANT
DRAIN SNY 10 ROU (WOUND CARE) ×2 IMPLANT
DRAPE PROXIMA HALF (DRAPES) ×1 IMPLANT
ELECT COATED BLADE 2.86 ST (ELECTRODE) ×2 IMPLANT
ELECT REM PT RETURN 9FT ADLT (ELECTROSURGICAL) ×2
ELECTRODE REM PT RTRN 9FT ADLT (ELECTROSURGICAL) ×1 IMPLANT
EVACUATOR SILICONE 100CC (DRAIN) ×1 IMPLANT
GAUZE SPONGE 4X4 16PLY XRAY LF (GAUZE/BANDAGES/DRESSINGS) IMPLANT
GLOVE BIO SURGEON STRL SZ 6.5 (GLOVE) ×1 IMPLANT
GLOVE BIO SURGEON STRL SZ7 (GLOVE) ×1 IMPLANT
GLOVE BIO SURGEON STRL SZ7.5 (GLOVE) ×1 IMPLANT
GLOVE BIOGEL PI IND STRL 7.0 (GLOVE) IMPLANT
GLOVE BIOGEL PI INDICATOR 7.0 (GLOVE) ×2
GLOVE ECLIPSE 8.0 STRL XLNG CF (GLOVE) ×2 IMPLANT
GLOVE SURG SS PI 7.0 STRL IVOR (GLOVE) ×1 IMPLANT
GOWN STRL REUS W/ TWL LRG LVL3 (GOWN DISPOSABLE) ×2 IMPLANT
GOWN STRL REUS W/ TWL XL LVL3 (GOWN DISPOSABLE) ×1 IMPLANT
GOWN STRL REUS W/TWL LRG LVL3 (GOWN DISPOSABLE) ×8
GOWN STRL REUS W/TWL XL LVL3 (GOWN DISPOSABLE) ×2
KIT BASIN OR (CUSTOM PROCEDURE TRAY) ×2 IMPLANT
KIT ROOM TURNOVER OR (KITS) ×2 IMPLANT
LOCATOR NERVE 3 VOLT (DISPOSABLE) IMPLANT
NDL HYPO 25GX1X1/2 BEV (NEEDLE) ×1 IMPLANT
NEEDLE HYPO 25GX1X1/2 BEV (NEEDLE) ×2 IMPLANT
NS IRRIG 1000ML POUR BTL (IV SOLUTION) ×2 IMPLANT
PAD ARMBOARD 7.5X6 YLW CONV (MISCELLANEOUS) ×4 IMPLANT
PENCIL BUTTON HOLSTER BLD 10FT (ELECTRODE) ×2 IMPLANT
SHEARS HARMONIC 9CM CVD (BLADE) ×1 IMPLANT
STAPLER VISISTAT 35W (STAPLE) ×2 IMPLANT
SUT CHROMIC 4 0 PS 2 18 (SUTURE) IMPLANT
SUT ETHILON 3 0 PS 1 (SUTURE) ×3 IMPLANT
SUT ETHILON 5 0 PS 2 18 (SUTURE) ×1 IMPLANT
SUT SILK 0 TIES 10X30 (SUTURE) IMPLANT
SUT SILK 3 0 REEL (SUTURE) IMPLANT
SUT SILK 4 0 REEL (SUTURE) IMPLANT
SYR CONTROL 10ML LL (SYRINGE) ×2 IMPLANT
TOWEL OR 17X24 6PK STRL BLUE (TOWEL DISPOSABLE) ×2 IMPLANT
TOWEL OR 17X26 10 PK STRL BLUE (TOWEL DISPOSABLE) ×2 IMPLANT
TRAY ENT MC OR (CUSTOM PROCEDURE TRAY) ×2 IMPLANT
TUBE CONNECTING 12X1/4 (SUCTIONS) IMPLANT

## 2014-07-12 NOTE — Discharge Instructions (Signed)
Keep head elevated x 3-4 nights Clean wound with Q-tip and water twice daily, then apply a thin coat of Bacitracin ointment or similar. OK to shower Advance diet as comfortable Call for signs of bleeding (sudden swelling), or infection (slow swelling, redness, increasing tenderness, fever), [phone] Recheck my office 10 days, Dr. Erik Obey, [phone]

## 2014-07-12 NOTE — Interval H&P Note (Signed)
History and Physical Interval Note:  07/12/2014 8:32 AM  [name]  has presented today for surgery, with the diagnosis of right submandibular stones  The various methods of treatment have been discussed with the patient and family. After consideration of risks, benefits and other options for treatment, the patient has consented to  Procedure(s): EXCISION RIGHT SUBMANDIBULAR GLAND (Right) as a surgical intervention .  The patient's history has been re-reviewed, patient re-examined, no change in status, stable for surgery.  I have re-reviewed the patient's chart and labs.  Questions were answered to the patient's satisfaction.     [name]

## 2014-07-12 NOTE — Op Note (Signed)
07/12/2014  10:15 AM    [name]  [id number]   Pre-Op Dx:  Right submandibular sialolithiasis  Post-op Dx: Same  Proc: Right submandibular gland excision   Surg:  [name] T MD  Asst.: Sallee Provencal PA   Anes:  GOT  EBL:  Minimal  Comp:  None  Findings:  Was lateral a superficially firm and fibrotic area of the submandibular gland with more normal-appearing gland more deeply. Ramus mandibularis, soft tissues lingual and hypoglossal nerves identified and preserved.   Procedure:  With the patient in a comfortable supine position, general orotracheal anesthesia was induced without difficulty. At an appropriate level, the patient was placed in a slight sitting position. The head was rotated to the left for access to the right neck. Identifying initials were noted.  A shoulder roll was placed and the head supported. The neck was palpated with the findings as described above.  A 6 cm incision was planned in a skin wrinkle. This was infiltrated with 1% Xylocaine with 1 100,000 epinephrine, 5 mL total. A sterile preparation and draping of the right neck was performed in the standard fashion.  The incision was sharply executed and carried down into the subcutaneous fat. Using the Bovie cautery, platysma muscle was lysed. A superior subplatysmal plane was elevated for a minimal distance. The capsule of the submitted for gland was identified with significant fibrosis. Blunt dissection beneath the capsule allow dissection around the inferior portion of the gland. On the superior portion of the gland, ramus mandibularis was identified and dissected upward and preserved. Working around the posterior aspect, around the anterior aspect, and finally superiorly, the gland was dissected. A branch of facial vein was controlled but the facial artery was avoided. Working deep to the gland, the tissues were noninflamed. The tendon of the digastric muscle was identified, and deep to this, the  hypoglossal and lingual nerves. Working forward, the mylohyoid muscle was identified. The gland was dissected up under the muscle, and finally the duct was identified and controlled. This delivered the specimen. No additional stones or lymph nodes were palpated. Hemostasis was observed. The wound was irrigated. Valsalva did not reveal any bleeding.  A 10 French round drain was placed through a separate puncture and laid into the wound bed. This was secured with a 3-0 nylon at the skin.  The wound was closed with 4-0 chromic at the platysma layer, and a running subcuticular 5-0 Ethilon in the skin. A good cosmetic closure was noted. The patient was cleaned, and bacitracin ointment was applied to the incision.  At this point the procedure was completed. The patient was returned to anesthesia, awakened, extubated, and transferred to recovery in stable condition.  Dispo:   PACU to 23 hour observation  Plan:  Ice, elevation, suction drainage, analgesia. Anticipate routine wound hygiene, drain removal at 23 hours and discharge to home in care of family.    [name] MD

## 2014-07-12 NOTE — Anesthesia Preprocedure Evaluation (Signed)
Anesthesia Evaluation  Patient identified by MRN, date of birth, ID band Patient awake    History of Anesthesia Complications Negative for: history of anesthetic complications  Airway Mallampati: I       Dental   Pulmonary former smoker,  breath sounds clear to auscultation        Cardiovascular negative cardio ROS  Rhythm:Regular Rate:Normal     Neuro/Psych    GI/Hepatic negative GI ROS, Neg liver ROS,   Endo/Other  negative endocrine ROS  Renal/GU negative Renal ROS     Musculoskeletal   Abdominal   Peds  Hematology   Anesthesia Other Findings   Reproductive/Obstetrics                             Anesthesia Physical Anesthesia Plan  ASA: I  Anesthesia Plan: General   Post-op Pain Management:    Induction: Intravenous  Airway Management Planned: LMA and Oral ETT  Additional Equipment:   Intra-op Plan:   Post-operative Plan: Extubation in OR  Informed Consent: I have reviewed the patients History and Physical, chart, labs and discussed the procedure including the risks, benefits and alternatives for the proposed anesthesia with the patient or authorized representative who has indicated his/her understanding and acceptance.   Dental advisory given  Plan Discussed with: CRNA and Surgeon  Anesthesia Plan Comments:         Anesthesia Quick Evaluation

## 2014-07-12 NOTE — H&P (View-Only) (Signed)
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  His family physician is concerned these may be seizures.  He is scheduled to see a neurologist in one month.  He does not think he is having heart rhythm problems nor blood pressure issues.  The spells are not necessarily orthostatic.  He may have some vague premonition that an attack is coming.  No change in hearing.  No vertigo.  He was monitored in the emergency room for 10-12 hours but has not had a Holter monitor or similar.  No obvious relationship to meals.  He did have CT scans of various body parts including head and neck.  There is a large calcific stone in the RIGHT submandibular gland posteriorly.  No obvious stones along the duct.   He has been aware of some slight fullness in the RIGHT upper neck as much as 9 years.  It may be getting slowly larger.  This past year, it is slightly tender to touch, and also some pain with sour foods such as pickles.  No obvious swelling with meals or foods.  He is not diabetic.  No relationship to meals.  The gland has never gotten large and stayed enlarged for several days.  No fever.  No bad taste in the mouth.  No xerostomia.  4 months recheck.  Preoperative evaluation.  We are preparing to perform a RIGHT submandibular gland excision for intraglandular stones.  He continues to have a swollen gland and some tenderness at various times but no obvious swelling with meals.  He has been cleared by both cardiology and neurology for his presumed syncopal episodes.   I discussed the surgery in detail including risks and complications.  Questions were answered and informed consent was obtained.  I discussed postoperative pain control, advancement of diet and activity, and return to work.  I gave him a prescription for Tylenol with codeine pills, and discussed  use of ibuprofen and/or Tylenol by itself for postoperative pain.  I will see him back 10 days after surgery for suture removal.  PMH:No past medical history on file.  Surg Hx: Past Surgical History  Procedure Laterality Date  . None      FHx:   Family History  Problem Relation Age of Onset  . Adopted: Yes  . Family history unknown: Yes   SocHx:  reports that he quit smoking about 3 years ago. He has never used smokeless tobacco. He reports that he drinks alcohol. He reports that he uses illicit drugs (Marijuana).  ALLERGIES:  Allergies  Allergen Reactions  . Hydrocodone      (Not in a hospital admission)  No results found for this or any previous visit (from the past 48 hour(s)). No results found.  DJS:HFWYOVZC: Feeling tired (fatigue).  No fever.  Night sweats.  No recent weight loss. Head: Headache. Eyes: No eye symptoms. Otolaryngeal: No hearing loss  and no earache.  Tinnitus.  No purulent nasal discharge.  No nasal passage blockage (stuffiness), no snoring, no sneezing, no hoarseness, and no sore throat. Cardiovascular: Chest pain or discomfort.  No palpitations. Pulmonary: No dyspnea, no cough, and no wheezing. Gastrointestinal: No dysphagia.  Heartburn.  No nausea, no abdominal pain, and no melena.  No diarrhea. Genitourinary: No dysuria. Endocrine: No muscle weakness. Musculoskeletal: Calf muscle cramps.  No arthralgias.  Soft tissue swelling. Neurological: No dizziness.  Fainting, tingling, and numbness. Psychological: No anxiety  and no depression. Skin: No rash.  BP:119/71,  HR: 76 b/min,  Height: 6 ft , Weight: 190 lb , BMI: 25.8 kg/m2,   PHYSICAL EXAM: He appears healthy.  Mental status is sharp.  He hears well in conversational speech.  Voice is clear and respirations unlabored through the nose.  The head is atraumatic and neck supple.  Cranial nerves intact.  Ear canals are clear with normal drums.  Anterior nose is moist and patent.  Oral cavity is  moist with teeth in fair to good repair.  There is clear saliva from the RIGHT Wharton's duct with compression of the gland.  Oropharynx is clear.  Neck with enlargement of the RIGHT submandibular gland which is quite firm.  On bi-digital palpation, the posterior aspect of the gland is firm consistent with a stone.  No obvious stones along the Wharton's duct course.  No adenopathy.  He is trim and healthy appearing.  Mental status is sharp.  He has a full beard.  The oral cavity is moist with no visible or palpable stones.  The RIGHT submandibular gland is firm and enlarged but not fixed.  No other neck masses.  Lingual, hypoglossal, and ramus mandibularis nerves are intact on both sides.   Lungs: Clear to auscultation Heart: Regular rate and rhythm without murmurs Abdomen: Soft, active Extremities: Normal configuration Neurologic: Symmetric, grossly intact.  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  You have salivary stones in the RIGHT submandibular gland.  We need to remove the entire gland to get rid of these.  We are scheduled to do this in the hospital.  This will take 1-1.5 hours and you will stay overnight one night.  I will leave you a  prescription for some codeine for pain relief afterwards if Tylenol and ibuprofen are not sufficient.  No aspirin for 10 days before surgery.  You may resume this after surgery.  If you are going to shave with a razor, I would like you to take the  beard off 2 or 3 days before the surgery.  If you are going to use an Copy or electric clippers, you can do this anytime.  I would like you to buy some Hibiclens (chlorhexidine) solution at the drug store and shower with this including washing your neck and face the night before surgery, and again the morning of surgery.  I will see you back here in the office 10 days after surgery.  No strenuous activity for 2 weeks after  surgery.  Acetaminophen-Codeine #3 300-30 MG Oral Tablet;TAKE 1 TO 2 TABLETS EVERY 4 TO 6 HOURS AS NEEDED FOR PAIN; MVH84; R0; Rx.  Erik Obey, Andrya Roppolo 6/96/2952, 1:28 PM

## 2014-07-12 NOTE — Transfer of Care (Signed)
Immediate Anesthesia Transfer of Care Note  Patient: [name]  Procedure(s) Performed: Procedure(s): EXCISION RIGHT SUBMANDIBULAR GLAND (Right)  Patient Location: PACU  Anesthesia Type:General  Level of Consciousness: awake, alert , oriented and patient cooperative  Airway & Oxygen Therapy: Patient Spontanous Breathing and Patient connected to nasal cannula oxygen  Post-op Assessment: Report given to RN, Post -op Vital signs reviewed and stable and Patient moving all extremities  Post vital signs: Reviewed and stable  Last Vitals:  Filed Vitals:   07/12/14 0645  BP: 125/68  Temp: 36.2 C  Resp: 20    Complications: No apparent anesthesia complications

## 2014-07-12 NOTE — Anesthesia Postprocedure Evaluation (Signed)
Anesthesia Post Note  Patient: [name]  Procedure(s) Performed: Procedure(s) (LRB): EXCISION RIGHT SUBMANDIBULAR GLAND (Right)  Anesthesia type: General  Patient location: PACU  Post pain: Pain level controlled and Adequate analgesia  Post assessment: Post-op Vital signs reviewed, Patient's Cardiovascular Status Stable, Respiratory Function Stable, Patent Airway and Pain level controlled  Last Vitals:  Filed Vitals:   07/12/14 1030  BP: 130/63  Pulse: 77  Temp:   Resp: 14    Post vital signs: Reviewed and stable  Level of consciousness: awake, alert  and oriented  Complications: No apparent anesthesia complications

## 2014-07-13 DIAGNOSIS — C080 Malignant neoplasm of submandibular gland: Secondary | ICD-10-CM | POA: Diagnosis not present

## 2014-07-13 NOTE — Discharge Summary (Signed)
  07/13/2014 11:57 AM  [name] [id number]  Post-Op Day 1    Temp:  [98.1 F (36.7 C)-99.3 F (37.4 C)] 98.6 F (37 C) (03/05 0736) Pulse Rate:  [50-77] 59 (03/05 0736) Resp:  [18-20] 18 (03/05 0736) BP: (99-124)/(60-76) 124/76 mmHg (03/05 0736) SpO2:  [97 %-100 %] 100 % (03/05 0736) Weight:  [87.998 kg (194 lb)] 87.998 kg (194 lb) (03/04 1328),     Intake/Output Summary (Last 24 hours) at 07/13/14 1157 Last data filed at 07/13/14 1045  Gross per 24 hour  Intake   3190 ml  Output      6 ml  Net   3184 ml   JP drain: 6 ml  No results found for this or any previous visit (from the past 24 hour(s)).  SUBJECTIVE:  No pain.  Eating, drinking, breathing fine. Voice cl.  Voiding well.   OBJECTIVE:  Lingual, hypoglossal, ramus mandibularis intact.  Wound flat.  Drain removed without difficulty  IMPRESSION:  Satisfactory check  PLAN:  Discharge home  Admit:  4 MAR Discharge: 5 MAY Final Diagnosis:  RIGHT submandibular sialolithiasis Proc:  RIGHT submandibular gland excision Comp: none Cond: ambulatory, pain controlled. min drainage.  Recheck: 8-10 days Rx:  Tylenol #3 Instructions written and given  Hosp Course:  Underwent RIGHT submandibular gland excision on day of admission without difficulty or complication.  Minimal pain, minimal drainage.  Drain removed on POD 1.  Discharged to home and care of family.    [name]

## 2014-07-13 NOTE — Progress Notes (Signed)
UR completed 

## 2014-07-13 NOTE — Progress Notes (Signed)
Discharge instructions gone over with patient. Home medications gone over. Patient has had prescriptions filled already. Follow up appointment to be made. Incisional care discussed. Diet, signs and symptoms of infection, and reasons to call the doctor gone over. Patient advised to sleep elevated on pillows for 3-4 nights. Patient verbalized understanding of instructions.

## 2014-07-15 ENCOUNTER — Encounter (HOSPITAL_COMMUNITY): Payer: Self-pay | Admitting: Otolaryngology

## 2014-07-16 ENCOUNTER — Encounter: Payer: Self-pay | Admitting: *Deleted

## 2014-07-22 ENCOUNTER — Telehealth: Payer: Self-pay | Admitting: *Deleted

## 2014-07-22 NOTE — Telephone Encounter (Signed)
Called pt to introduce myself as the oncology nurse navigator that works with Dr. Isidore Moos to whom he has been referred by Dr. Erik Obey.    He acknowledged understanding of referral.  I explained the concept and format of our H&N [place] that is being held this Wednesday, asked if he was available to attend.  He indicated he was.  I provided him an arrival time of 12:15, confirmed his understanding of [place] location, arrival and check-in procedure.  I provided my contact information, encouraged him to call me with any questions prior to [facility].  He verbalized understanding of information provided.  Gayleen Orem, RN, BSN, [place] at [place] [phone]

## 2014-07-23 ENCOUNTER — Encounter: Payer: Self-pay | Admitting: Radiation Oncology

## 2014-07-23 ENCOUNTER — Telehealth: Payer: Self-pay | Admitting: *Deleted

## 2014-07-23 NOTE — Telephone Encounter (Signed)
LVM for patient with reminder of 12:30 arrival to Radiation Oncology Waiting for tomorrow's [place].  Gayleen Orem, RN, BSN, [place] at [place] [phone]

## 2014-07-23 NOTE — Progress Notes (Signed)
Head and Neck Cancer Location of Tumor / Histology: right submandibular gland  Patient presented 02/21/14 with symptoms of: large submandibular gland, tender/painful especially with sour foods, enlarging slowly  Biopsies of right submandibular gland (if applicable) revealed:  06/12/93 Diagnosis Submandibular gland, Right - ADENOID CYSTIC CARCINOMA, GRADE I OF III, SEE COMMENT. - POSITIVE FOR PERINEURAL INVASION. - TUMOR INVOLVES SURGICAL EXCISIONAL MARGIN - SEE TUMOR SYNOPTIC TEMPLATE BELOW.  Nutrition Status Yes No Comments  Weight changes? []  [x]    Swallowing concerns? []  [x]    PEG? []  [x]     Referrals Yes No Comments  Social Work? [x]  []  [facility] [place]  Dentistry? []  [x]    Swallowing therapy? [x]  []  [facility]  Nutrition? [x]  []  [facility]  Med/Onc? [x]  []  [facility]   Safety Issues Yes No Comments  Prior radiation? []  [x]    Pacemaker/ICD? []  [x]    Possible current pregnancy? []  [x]    Is the patient on methotrexate? []  [x]     Tobacco/Marijuana/Snuff/ETOH use: former smoker, quit 02/22/11, marijuana use, occasional beer  Past/Anticipated interventions by otolaryngology, if any: biopsy  Past/Anticipated interventions by medical oncology, if any:   Current Complaints / other details: Adopted Lives with wife. Has 2 children. Education: high school. Works as Associate Professor at [employer]

## 2014-07-24 ENCOUNTER — Encounter: Payer: Self-pay | Admitting: *Deleted

## 2014-07-24 ENCOUNTER — Encounter: Payer: 59 | Admitting: Nutrition

## 2014-07-24 ENCOUNTER — Ambulatory Visit
Admission: RE | Admit: 2014-07-24 | Discharge: 2014-07-24 | Disposition: A | Discharge status: Home / Self Care | Payer: 59 | Source: Ambulatory Visit | Attending: Radiation Oncology | Admitting: Radiation Oncology

## 2014-07-24 ENCOUNTER — Ambulatory Visit: Payer: 59 | Attending: Radiation Oncology | Admitting: Physical Therapy

## 2014-07-24 ENCOUNTER — Encounter: Payer: Self-pay | Admitting: Radiation Oncology

## 2014-07-24 ENCOUNTER — Ambulatory Visit: Payer: 59 | Admitting: Nutrition

## 2014-07-24 VITALS — BP 133/86 | HR 65 | Temp 98.4°F | Resp 18 | Ht 73.0 in | Wt 192.6 lb

## 2014-07-24 DIAGNOSIS — C080 Malignant neoplasm of submandibular gland: Secondary | ICD-10-CM | POA: Insufficient documentation

## 2014-07-24 DIAGNOSIS — Z9189 Other specified personal risk factors, not elsewhere classified: Secondary | ICD-10-CM | POA: Diagnosis not present

## 2014-07-24 DIAGNOSIS — Z87891 Personal history of nicotine dependence: Secondary | ICD-10-CM

## 2014-07-24 DIAGNOSIS — F1290 Cannabis use, unspecified, uncomplicated: Secondary | ICD-10-CM | POA: Insufficient documentation

## 2014-07-24 DIAGNOSIS — R293 Abnormal posture: Secondary | ICD-10-CM | POA: Diagnosis not present

## 2014-07-24 DIAGNOSIS — Z791 Long term (current) use of non-steroidal anti-inflammatories (NSAID): Secondary | ICD-10-CM

## 2014-07-24 HISTORY — DX: Malignant (primary) neoplasm, unspecified: C80.1

## 2014-07-24 HISTORY — DX: Syncope and collapse: R55

## 2014-07-24 HISTORY — DX: Sialolithiasis: K11.5

## 2014-07-24 NOTE — Progress Notes (Signed)
Patient seen in Head and Neck Clinic.  39 year old male diagnosed with large Submandibular gland.  Past medical history includes: Marijuana and ETOH.  Medications include MVI.  Labs were reviewed.  Height:  6 ft, 1 in. Weight: 192 pounds. UBW: 240 pounds 2 years ago per patient BMI: 25.42  Patient reports he has been altering his diet and has lost over 40 pounds over two years intentionally. He reports constipation while on pain medication. He is eating a regular diet. Reports goal weight of 188 - 200 pounds. Reports consuming 3 L water daily. Patient likely to receive concurrent chemoradiation therapy with anticipated PEG.  Nutrition Diagnosis:   Predicted sub-optimal energy intake related to new diagnosis of large submandibular gland as evidenced by a condition for which research shows a sub-optimal intake of energy needs.  Intervention:  Educated patient on strategies for consuming adequate calories and protein in small frequent meals and snacks. Reviewed high protein foods with patient. Provided fact sheet on increasing calories and protein. Educated patient on strategies for improving constipation. Provided fact sheets. Educated patient to continue increased water consumption. Questions answered. Teach back method used. Contact information given.  Monitoring, evaluation, goals: Patient will tolerate adequate calories and protein to promote weight maintenance throughout treatment.  Next Visit: To be scheduled.

## 2014-07-24 NOTE — Therapy (Signed)
[place], [place], [address] Phone: [phone]   Fax:  [phone]  Physical Therapy Evaluation  Patient Details  Name: [name] MRN: [id number] Date of Birth: 08/04/1975 Referring Provider:  Sharion Balloon, FNP  Encounter Date: 07/24/2014      PT End of Session - 07/24/14 1425    Visit Number 1   Number of Visits 1   Date for PT Re-Evaluation 10/23/14   PT Start Time 3704   PT Stop Time 1413   PT Time Calculation (min) 25 min   Activity Tolerance Patient tolerated treatment well   Behavior During Therapy [facility] for tasks assessed/performed      Past Medical History  Diagnosis Date  . History of stress test     workup done for syncope   . 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 Syncopal episodes   . Carcinoma 07/2014    adenoid cystic    Past Surgical History  Procedure Laterality Date  . None    . Submandibular gland excision Right 88/89/1694    DR Erik Obey  . Submandibular gland excision Right 07/12/2014    Procedure: EXCISION RIGHT SUBMANDIBULAR GLAND;  Surgeon: [name], MD;  Location: [facility];  Service: ENT;  Laterality: Right;    There were no vitals filed for this visit.  Visit Diagnosis:  Posture imbalance - Plan: PT plan of care cert/re-cert  At risk for lymphedema - Plan: PT plan of care cert/re-cert      Subjective Assessment - 07/24/14 1415    Symptoms Had excision of lymph node right neck 07/12/14 and is still healing from that, with slight discomfort with turning head to left.   Pertinent History Pt. presented 02/21/14 with large submandibular gland, tender/painful especially with sour foods, and enlarging slowly.  Underwent excisional biopsy of right submandibular gland 07/12/14, identified as adenoid cystic carcinoma with perineural invasion, positive margin.                                    Currently in Pain? No/denies            [facility] PT Assessment - 07/24/14 0001     Assessment   Medical Diagnosis right submandibular gland adenoid cystic carcinoma   Onset Date 02/21/14   Precautions   Precautions Other (comment)  cancer precautions   Restrictions   Weight Bearing Restrictions No   Balance Screen   Has the patient fallen in the past 6 months No   Has the patient had a decrease in activity level because of a fear of falling?  No   Is the patient reluctant to leave their home because of a fear of falling?  No   Home Environment   Living Enviornment Private residence   [place]  friend, Kenney Houseman, with him today at eval   Type of [place] Two level   Prior Function   Level of Independence Independent with basic ADLs;Independent with homemaking with ambulation;Independent with gait   Vocation Full time employment   Vocation Requirements --  Is a Higher education careers adviser, a physical job with walking, liftin   Observation/Other Assessments   Observations healthy-appearing man looking his age of 17   Skin Integrity right neck incision still with stitches in place, appears to be healing well   Functional Tests   Functional tests Sit to Stand   Sit  to Stand   Comments 17 times in 30 seconds   Posture/Postural Control   Posture/Postural Control Postural limitations   Postural Limitations Forward head   ROM / Strength   AROM / PROM / Strength AROM;Strength   AROM   Overall AROM  Within functional limits for tasks performed   Overall AROM Comments neck and shoulder AROM assessed; good neck ROM despite healing incision right side   Strength   Overall Strength Other (comment)  not formally assessed, but pt. does lifting on his job   Ambulation/Gait   Ambulation/Gait Yes   Ambulation/Gait Assistance 7: Independent                           PT Education - 07/24/14 1424    Education provided Yes   Education Details neck ROM, posture, walking, lymphedema info   Person(s) Educated Patient;Other (comment)   "Tonya"   Methods Explanation;Handout   Comprehension Verbalized understanding                 Head and Neck Clinic Goals - 07/24/14 1427    Patient will be able to verbalize understanding of a home exercise program for cervical range of motion, posture, and walking.    Status Achieved   Patient will be able to verbalize understanding of proper sitting and standing posture.    Status Achieved   Patient will be able to verbalize understanding of lymphedema risk and availability of treatment for this condition.    Status Achieved           Plan - 07/24/14 1425    Clinical Impression Statement Patient with forward head posture but otherwise reported by himself to be in good physical condition, may benefit from treatment if lymphedema develops.   Pt will benefit from skilled therapeutic intervention in order to improve on the following deficits Decreased knowledge of precautions   Rehab Potential Excellent   PT Frequency One time visit   PT Treatment/Interventions Patient/family education   PT Next Visit Plan None at this time; reassess if need develops such as with swelling or decreased ROM.   PT Home Exercise Plan see education section   Consulted and Agree with Plan of Care Patient         Problem List Patient Active Problem List   Diagnosis Date Noted  . Adenoid cystic carcinoma 07/24/2014  . 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 Syncope 02/21/2014  . Chest pain 02/21/2014    SALISBURY,DONNA 07/24/2014, 2:29 PM  [place], [place], [address] Phone: [phone]   Fax:  [place], PT 07/24/2014 2:29 PM

## 2014-07-24 NOTE — Progress Notes (Signed)
Radiation Oncology         [phone] ________________________________  Initial outpatient Consultation  Name: [name] MRN: [id number]  Date: 07/24/2014  DOB: 11-Jan-1976  QJ:JHERD, Theador Hawthorne, FNP  [name], MD   REFERRING PHYSICIAN: [name], MD  DIAGNOSIS:  Right submandibular adenoid cystic carcinoma, Grade I, with PNI and positive margins (pT2Nx, clinical T2N0Mx)    ICD-9-CM ICD-10-CM   1. Adenoid cystic carcinoma of submandibular gland 142.1 C08.0 MR FACE/TRIGEMINAL WO/W CM     Ambulatory referral to Dentistry     CT Chest W Contrast     CT Soft Tissue Neck W Contrast     CT Abdomen W Contrast    HISTORY OF PRESENT ILLNESS::[name] is a 39 y.o. male who presented with symptoms of a large right submandibular gland 8-9 yrs, tender/painful especially with sour foods, enlarging slowly.    CT was consistent with inflammation related to right submandibular stones. Dr Erik Obey excised the right submandibular gland on 07-12-14. Pathology revealed a 3.8 cm tumor, adenoid cystic carcinoma, Grade I, with PNI, and positive margins, but no LVSI or named nerve involvement.  We discussed him at tumor board and plan to order staging scans and thereafter Dr Erik Obey intends to reexcise  the positive margin.  Patient has postoperative numbness from the level II region of the right neck through the right jaw.  He is a former smoker and chewer of tobacco (quit 2012).  PATHOLOGY: Diagnosis Submandibular gland, Right - ADENOID CYSTIC CARCINOMA, GRADE I OF III, SEE COMMENT. - POSITIVE FOR PERINEURAL INVASION. - TUMOR INVOLVES SURGICAL EXCISIONAL MARGIN - SEE TUMOR SYNOPTIC TEMPLATE BELOW. Microscopic Comment ONCOLOGY TABLE - SALIVARY GLAND 1. Specimen: Right submandibular gland 2. Procedure: Excision. 3. Tumor site and laterality: Right submandibular gland 4. Tumor focality: Unifocal 5. Maximum tumor size (cm): 3.8 cm 6. Histologic type: Adenoid cystic carcinoma. 7.  Grade: I of III 8. Margins: Positive Distance of tumor from closest margin: Present at margin 9. Perineural invasion: Present 10. Lymph-Vascular invasion: Absent. 11. Lymph nodes: # examined - 0; # positive - N/A; extracapsular extension - N/A 12. TNM code: pT2, pNX, pMX  PREVIOUS RADIATION THERAPY: No  PAST MEDICAL HISTORY:  has a past medical history of History of stress test; Salivary duct stones (06/2014); Syncopal episodes; and Carcinoma (07/2014).    PAST SURGICAL HISTORY: Past Surgical History  Procedure Laterality Date  . None    . Submandibular gland excision Right 40/81/4481    DR Erik Obey  . Submandibular gland excision Right 07/12/2014    Procedure: EXCISION RIGHT SUBMANDIBULAR GLAND;  Surgeon: [name], MD;  Location: [facility];  Service: ENT;  Laterality: Right;    FAMILY HISTORY: He was adopted. Family history is unknown by patient.  SOCIAL HISTORY:  reports that he quit smoking about 3 years ago. He has quit using smokeless tobacco. His smokeless tobacco use included Snuff. He reports that he drinks alcohol. He reports that he uses illicit drugs (Marijuana).  ALLERGIES: Hydrocodone  MEDICATIONS:  Current Outpatient Prescriptions  Medication Sig Dispense Refill  . acetaminophen-codeine (TYLENOL #3) 300-30 MG per tablet Take 1-2 tablets by mouth every 4 (four) hours as needed for moderate pain.    . cephALEXin (KEFLEX) 500 MG capsule Take 500 mg by mouth 4 (four) times daily.    . diphenhydramine-acetaminophen (TYLENOL PM) 25-500 MG TABS Take 1 tablet by mouth at bedtime as needed.    . fexofenadine (ALLEGRA) 180 MG tablet Take 180 mg by mouth  daily as needed for allergies. OTC    . ibuprofen (ADVIL,MOTRIN) 200 MG tablet Take 400 mg by mouth every 6 (six) hours as needed for moderate pain.    Marland Kitchen loratadine (CLARITIN) 10 MG tablet Take 10 mg by mouth daily.    . Multiple Vitamin (MULTIVITAMIN WITH MINERALS) TABS tablet Take 1 tablet by mouth daily.     No current  facility-administered medications for this encounter.    REVIEW OF SYSTEMS:  Notable for that above.   PHYSICAL EXAM:  height is 6\' 1"  (1.854 m) and weight is 192 lb 9.6 oz (87.363 kg). His temperature is 98.4 F (36.9 C). His blood pressure is 133/86 and his pulse is 65. His respiration is 18.   General: Alert and oriented, in no acute distress HEENT: Head is normocephalic. Extraocular movements are intact. Oropharynx is clear. + numbness including level II right neck through right jaw Neck: Neck is supple, no palpable cervical or supraclavicular lymphadenopathy. Postoperative scar over right neck healing. Heart: Regular in rate and rhythm with no murmurs, rubs, or gallops. Chest: Clear to auscultation bilaterally, with no rhonchi, wheezes, or rales. Abdomen: Soft, nontender, nondistended, with no rigidity or guarding. Extremities: No cyanosis or edema. Lymphatics: see Neck Exam Skin: No concerning lesions. Musculoskeletal: symmetric strength and muscle tone throughout. Neurologic: Cranial nerves II through XII are grossly intact. No obvious focalities. Speech is fluent. Coordination is intact. Psychiatric: Judgment and insight are intact. Affect is appropriate.   ECOG = 0  0 - Asymptomatic (Fully active, able to carry on all predisease activities without restriction)  1 - Symptomatic but completely ambulatory (Restricted in physically strenuous activity but ambulatory and able to carry out work of a light or sedentary nature. For example, light housework, office work)  2 - Symptomatic, <50% in bed during the day (Ambulatory and capable of all self care but unable to carry out any work activities. Up and about more than 50% of waking hours)  3 - Symptomatic, >50% in bed, but not bedbound (Capable of only limited self-care, confined to bed or chair 50% or more of waking hours)  4 - Bedbound (Completely disabled. Cannot carry on any self-care. Totally confined to bed or chair)  5 -  Death   Eustace Pen MM, Creech RH, Tormey DC, et al. [phone]). "Toxicity and response criteria of the [facility]". [place] Oncol. 5 (6): 649-55   LABORATORY DATA:  Lab Results  Component Value Date   WBC 7.3 07/04/2014   HGB 15.2 07/04/2014   HCT 44.5 07/04/2014   MCV 86.2 07/04/2014   PLT 192 07/04/2014   CMP     Component Value Date/Time   NA 137 07/04/2014 1149   K 4.1 07/04/2014 1149   CL 104 07/04/2014 1149   CO2 28 07/04/2014 1149   GLUCOSE 97 07/04/2014 1149   BUN 10 07/04/2014 1149   CREATININE 0.95 07/04/2014 1149   CALCIUM 9.4 07/04/2014 1149   PROT 7.5 02/19/2014 1116   ALBUMIN 4.4 02/19/2014 1116   AST 28 02/19/2014 1116   ALT 22 02/19/2014 1116   ALKPHOS 74 02/19/2014 1116   BILITOT 0.6 02/19/2014 1116   GFRNONAA >90 07/04/2014 1149   GFRAA >90 07/04/2014 1149         RADIOGRAPHY: No results found.    IMPRESSION/PLAN: This is a delightful 39 year old man with with  Right submandibular adenoid cystic carcinoma, Grade I, with PNI and positive margins (pT2Nx, clinical T2N0Mx). T The patient has been  discussed in detail at tumor board and seen in the context of multidisciplinary clinic today. Plan is as below:   1) Will order MRI of face to r/o gross nerve involvement (ie CN 5 or 7) and CT imaging of neck, chest, abdomen.  If this reveals distant metastases, it could change our recommendations, as discussed with the patient.  He and his significant other had thoughtful questions re: his prognosis, and understand there is a considerable risk of developing distant metastases over the next 10 years even if initial scans are clear and local treatment is aggressive. There is no standard role for chemotherapy for local disease.  2) If no distant metastases, Dr Erik Obey plans attempt to clear margins with reexcision   3) Referral has been made to dentistry for dental evaluation/extractions in preparation for radiation in the vicinity of the mouth     4) Today in multidisciplinary clinic the patient will see Polo Riley from social work for social support  5) Today in multidisciplinary clinic he will see nutrition for nutrition support     6) Physical therapist will see patient today in [facility] clinic for neck measurements due to risk of lymphedema in neck; may benefit from PT for this after completion of radiotherapy. The patient also may benefit from PT for potentional deconditioning after treatment    7) Simulation once cleared by ENT and dentistry. Anticipate 6-7 weeks of RT; will look at MRI in detail    It was a pleasure meeting the patient today. We discussed the risks, benefits, and side effects of radiotherapy.   We talked in detail about acute and late effects. He understands that some of the most bothersome acute effects will be significant soreness of the mouth and throat, changes in taste, changes in salivary function, skin irritation, hair loss, dehydration, weight loss and fatigue. We talked about late effects which include but are not necessarily limited to dysphagia, dental issues, hypothyroidism (depending on how much of neck is treated), dry mouth, trismus, neck edema and nerve or spinal cord injury. No guarantees of treatment were given. A consent form was signed and placed in the patient's medical record. The patient is enthusiastic about proceeding with treatment. I look forward to participating in the patient's care.  __________________________________________   Eppie Gibson, MD

## 2014-07-25 ENCOUNTER — Telehealth: Payer: Self-pay | Admitting: *Deleted

## 2014-07-25 NOTE — Telephone Encounter (Signed)
Patient's significant other called with expressed need for financial counseling.  I noted I would refer them for an appt.  Gayleen Orem, RN, BSN, [place] at [place] [phone]

## 2014-07-25 NOTE — Telephone Encounter (Signed)
Called patient to inform of scans, lvm for a return call

## 2014-07-26 ENCOUNTER — Ambulatory Visit: Payer: 59

## 2014-07-26 ENCOUNTER — Ambulatory Visit: Admission: RE | Admit: 2014-07-26 | Payer: 59 | Source: Ambulatory Visit | Admitting: Radiation Oncology

## 2014-07-26 NOTE — Progress Notes (Signed)
Head & Neck Multidisciplinary Clinic Clinical Social Work  Clinical Social Work met with patient/family and radiation oncologist at head & neck multidisciplinary clinic to offer support and assess for psychosocial needs.  Radiation oncologist reviewed patient's diagnosis and recommended treatment plan with patient/family.  [name] was accompanied by his significant other.   The patient scored a 10 on the Psychosocial Distress Thermometer which indicates severe distress. [name] shared he rated his distress a 10 due to his new diagnosis and shared he done research that revealed he "had a very rare cancer that no one knew how to treat".  CSW reviewed with patient radiation oncologists plan- that she was familiar with how to treat patient's cancer even with it being rare, but that she would need additional scans before starting treatment.  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  CSW provided brief emotional support and counseling.  Patient was appreciative of visit, but not interested in supportive services.  Clinical Social Work briefly discussed Clinical Social Work role and [facility] support programs/services.  Clinical Social Work encouraged patient to call with any additional questions or concerns.  ONCBCN DISTRESS SCREENING 07/26/2014  Screening Type Initial Screening  Distress experienced in past week (1-10) 10  Practical problem type Work/school  Emotional problem type Depression;Nervousness/Anxiety;Adjusting to illness;Isolation/feeling alone;Feeling hopeless  Spiritual/Religous concerns type Facing my mortality  Information Concerns Type Lack of info about diagnosis  Physical Problem type Sleep/insomnia  Referral to clinical social work Yes   Lauren Mullis, MSW, LCSW, OSW-C Clinical Social Worker [facility] [phone]        

## 2014-07-26 NOTE — Progress Notes (Signed)
To provide support and care continuity, met with patient during H&N [place].  He was accompanied by his SO, Diona Browner. 1. Further introduced myself as their Navigator, explained my role as a member of the Care Team, provided contact information, encouraged them to contact me with questions/concerns as treatments/procedures begin. 2. Provided New Patient Information packet:  Contact information for physician and navigator  Advance Directive information ([place] blue pamphlet)  Fall Prevention Patient Safety Plan  Appointment Guideline  [facility] campus map with highlight of [place] 3. Provided introductory explanation of radiation treatment including SIM planning and fitting of head mask, showed them example of mask.   4. Patient stated he wants to be treated at [facility]. 5. Provided a tour of SIM and [place] areas, explained treatment and arrival procedures.   They verbalized understanding of information provided.    Gayleen Orem, RN, BSN, [place] at [place] [phone]

## 2014-07-29 ENCOUNTER — Ambulatory Visit (HOSPITAL_COMMUNITY)
Admission: RE | Admit: 2014-07-29 | Discharge: 2014-07-29 | Disposition: A | Discharge status: Home / Self Care | Payer: 59 | Source: Ambulatory Visit | Attending: Radiation Oncology | Admitting: Radiation Oncology

## 2014-07-29 ENCOUNTER — Ambulatory Visit (HOSPITAL_COMMUNITY): Admission: RE | Admit: 2014-07-29 | Payer: 59 | Source: Ambulatory Visit

## 2014-07-29 DIAGNOSIS — C080 Malignant neoplasm of submandibular gland: Secondary | ICD-10-CM | POA: Diagnosis not present

## 2014-07-29 DIAGNOSIS — Z08 Encounter for follow-up examination after completed treatment for malignant neoplasm: Secondary | ICD-10-CM | POA: Diagnosis not present

## 2014-07-29 MED ORDER — IOHEXOL 300 MG/ML  SOLN
150.0000 mL | Freq: Once | INTRAMUSCULAR | Status: AC | PRN
  Administered 2014-07-29: 150 mL via INTRAVENOUS

## 2014-07-29 NOTE — Progress Notes (Signed)
Patient agreed to pay for CT abdomen with contrast he signed AOB Called  And notified Dr. Pearlie Oyster office

## 2014-07-30 ENCOUNTER — Encounter: Payer: Self-pay | Admitting: *Deleted

## 2014-07-30 ENCOUNTER — Ambulatory Visit
Admission: RE | Admit: 2014-07-30 | Discharge: 2014-07-30 | Disposition: A | Discharge status: Home / Self Care | Payer: 59 | Source: Ambulatory Visit | Attending: Radiation Oncology | Admitting: Radiation Oncology

## 2014-07-30 ENCOUNTER — Telehealth: Payer: Self-pay | Admitting: *Deleted

## 2014-07-30 DIAGNOSIS — C080 Malignant neoplasm of submandibular gland: Secondary | ICD-10-CM

## 2014-07-30 MED ORDER — GADOBENATE DIMEGLUMINE 529 MG/ML IV SOLN
18.0000 mL | Freq: Once | INTRAVENOUS | Status: AC | PRN
  Administered 2014-07-30: 18 mL via INTRAVENOUS

## 2014-07-30 NOTE — Progress Notes (Signed)
In follow-up to my conversation with Amy at [facility] ENT and indication that Dr. Erik Obey can proceed with scheduling of follow-up surgery, faxed reports for patient's 3/21 CTs of abd, chest and neck and 3/22 MRI of face to her attention.  Successful transmittal notice received.  Gayleen Orem, RN, BSN, [place] at [place] [phone]

## 2014-07-30 NOTE — Telephone Encounter (Signed)
Patient's SO called 07/29/14 @ 3:45 pm.  Patient is very anxious about CT results and would appreciate being called as soon as results available.  I informed Dr. Isidore Moos.    Gayleen Orem, RN, BSN, [place] at [place] [phone]

## 2014-07-31 ENCOUNTER — Encounter (HOSPITAL_COMMUNITY): Payer: Self-pay | Admitting: Dentistry

## 2014-07-31 ENCOUNTER — Ambulatory Visit (HOSPITAL_COMMUNITY): Payer: Self-pay | Admitting: Dentistry

## 2014-07-31 VITALS — BP 120/64 | HR 70 | Temp 98.2°F

## 2014-07-31 DIAGNOSIS — K036 Deposits [accretions] on teeth: Secondary | ICD-10-CM

## 2014-07-31 DIAGNOSIS — M270 Developmental disorders of jaws: Secondary | ICD-10-CM

## 2014-07-31 DIAGNOSIS — C080 Malignant neoplasm of submandibular gland: Secondary | ICD-10-CM

## 2014-07-31 DIAGNOSIS — Z01818 Encounter for other preprocedural examination: Secondary | ICD-10-CM

## 2014-07-31 DIAGNOSIS — K0530 Chronic periodontitis, unspecified: Secondary | ICD-10-CM

## 2014-07-31 DIAGNOSIS — K0850 Unsatisfactory restoration of tooth, unspecified: Secondary | ICD-10-CM

## 2014-07-31 MED ORDER — SODIUM FLUORIDE 1.1 % DT GEL: DENTAL | Status: DC

## 2014-07-31 NOTE — Patient Instructions (Signed)

## 2014-07-31 NOTE — Progress Notes (Signed)
DENTAL CONSULTATION  Date of Consultation:  07/31/2014 Patient Name:   [name] Date of Birth:   [birth year] Medical Record Number: [id number]  VITALS: BP 120/64 mmHg  Pulse 70  Temp(Src) 98.2 F (36.8 C) (Oral)  CHIEF COMPLAINT: Patient referred by Dr. Isidore Moos for a dental consultation.   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 Patient with anticipated reexcision by Dr. Erik Obey followed by postoperative radiation therapy. Patient is now seen as part of a medically necessary preradiation therapy dental protocol examination.  Patient currently denies acute toothaches, swellings, or abscesses. Patient has not been seen by dentist since he was a " teenager".  Patient has no regular primary dentist. The patient has not had any dental extractions.  PROBLEM LIST: Patient Active Problem List   Diagnosis Date Noted  . Adenoid cystic carcinoma of submandibular gland 07/24/2014    Priority: High  . 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 Syncope 02/21/2014  . Chest pain 02/21/2014    PMH: Past Medical History  Diagnosis Date  . History of stress test     workup done for syncope   . 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 Syncopal episodes   . Carcinoma 07/2014    adenoid cystic    PSH: Past Surgical History  Procedure Laterality Date  . None    . Submandibular gland excision Right 29/51/8841    DR Erik Obey  . Submandibular gland excision Right 07/12/2014    Procedure: EXCISION RIGHT SUBMANDIBULAR GLAND;  Surgeon: [name], MD;  Location: [place];  Service: ENT;  Laterality: Right;    ALLERGIES: Allergies  Allergen Reactions  . Hydrocodone Itching    MEDICATIONS: Current Outpatient Prescriptions  Medication Sig Dispense Refill  . acetaminophen-codeine (TYLENOL #3) 300-30 MG per tablet Take 1-2 tablets by mouth every 4 (four) hours as needed for moderate pain.    . cephALEXin (KEFLEX) 500 MG  capsule Take 500 mg by mouth 4 (four) times daily.    . diphenhydramine-acetaminophen (TYLENOL PM) 25-500 MG TABS Take 1 tablet by mouth at bedtime as needed.    . fexofenadine (ALLEGRA) 180 MG tablet Take 180 mg by mouth daily as needed for allergies. OTC    . ibuprofen (ADVIL,MOTRIN) 200 MG tablet Take 400 mg by mouth every 6 (six) hours as needed for moderate pain.    Marland Kitchen loratadine (CLARITIN) 10 MG tablet Take 10 mg by mouth daily.    . Multiple Vitamin (MULTIVITAMIN WITH MINERALS) TABS tablet Take 1 tablet by mouth daily.    . sodium fluoride (FLUORISHIELD) 1.1 % GEL dental gel Instill one drop of gel per tooth space of fluoride tray. Place over teeth for 5 minutes. Remove. Spit out excess. Repeat nightly. 120 mL prn   No current facility-administered medications for this visit.    LABS: Lab Results  Component Value Date   WBC 7.3 07/04/2014   HGB 15.2 07/04/2014   HCT 44.5 07/04/2014   MCV 86.2 07/04/2014   PLT 192 07/04/2014      Component Value Date/Time   NA 137 07/04/2014 1149   K 4.1 07/04/2014 1149   CL 104 07/04/2014 1149   CO2 28 07/04/2014 1149   GLUCOSE 97 07/04/2014 1149   BUN 10 07/04/2014 1149   CREATININE 0.95 07/04/2014 1149   CALCIUM 9.4 07/04/2014 1149   GFRNONAA >90 07/04/2014 1149   GFRAA >90 07/04/2014 1149   No results found for: INR, PROTIME No results  found for: PTT  SOCIAL HISTORY: History   Social History  . Marital Status: Married    Spouse Name: N/A  . Number of Children: N/A  . Years of Education: N/A   Occupational History  . Not on file.   Social History Main Topics  . Smoking status: Former Smoker -- 0.50 packs/day for 9 years    Quit date: 02/22/2011  . Smokeless tobacco: Former Systems developer    Types: Snuff  . Alcohol Use: 0.0 oz/week    0 Standard drinks or equivalent per week     Comment: occas. beer  . Drug Use: Yes    Special: Marijuana     Comment: once weekly  . Sexual Activity: Not on file   Other Topics Concern  . Not  on file   Social History Narrative   Lives with wife in house with a basement.  Stairs are no problem.  Has 2 children. Education: high school.  Works as Associate Professor at [employer].     FAMILY HISTORY: Family History  Problem Relation Age of Onset  . Adopted: Yes  . Family history unknown: Yes    REVIEW OF SYSTEMS: Reviewed with the patient included in dental record.  DENTAL HISTORY: CHIEF COMPLAINT: Patient referred by Dr. Isidore Moos for a dental consultation.   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 Patient with anticipated reexcision by Dr. Erik Obey followed by postoperative radiation therapy. Patient is now seen as part of a medically necessary preradiation therapy dental protocol examination.  Patient currently denies acute toothaches, swellings, or abscesses. Patient has not been seen by dentist since he was a " teenager".  Patient has no regular primary dentist. The patient has not had any dental extractions.  DENTAL EXAMINATION: GENERAL: Patient is a well-developed, well-nourished male in no acute distress. HEAD AND NECK: The right neck is consistent with previous right submandibular gland removal. No left neck lymphadenopathy is noted. INTRAORAL EXAM: The patient has normal saliva. I do not see any evidence of oral abscess formation. The patient has bilateral mandibular lingual tori. The patient has a maximum interincisal opening of 55 mm. DENTITION: Patient is not missing any teeth. All teeth are erupted and present with no impactions. PERIODONTAL: Patient has chronic periodontitis with plaque and calculus accumulations, selective areas of gingival recession, and no significant tooth mobility. DENTAL CARIES/SUBOPTIMAL RESTORATIONS: There are no obvious dental caries noted. Patient has a fractured mesial lingual cusp on #31. Multiple occlusal amalgams are noted. ENDODONTIC: Patient  denies having a history of acute pulpitis symptoms. No obvious periapical pathology is noted. CROWN AND BRIDGE: There are no crown or bridge restorations. PROSTHODONTIC: No partial dentures. OCCLUSION: The patient has a stable occlusion. Patient has a mandibular anterior crowding.  RADIOGRAPHIC INTERPRETATION: An orthopantogram was taken and supplemented with a full series of dental radiographs. There are no missing teeth. No impactions are noted. There is incipient bone loss. Multiple amalgam restorations are noted. Maxillary sinuses are noted bilaterally and are well aerated. Radiolucent area consistent with mesiolingual cusp fractures noted on tooth #31.  ASSESSMENTS: 1. 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 Preradiation therapy dental protocol examination 3. No missing teeth. No impactions. 4. Chronic periodontitis with incipient bone loss 5. Accretions 6. Fractured tooth #31 with need for restoration.  7. Bilateral mandibular lingual tori 8. Stable occlusion  PLAN/RECOMMENDATIONS: 1. I discussed the risks, benefits, and complications of various treatment options with the  patient in relationship to his medical and dental conditions, anticipated surgical resection, anticipated radiation therapy, and radiation therapy side effects to include xerostomia, radiation caries, trismus, mucositis, taste changes, gum and jawbone changes, and risk for infection, bleeding, and osteoradionecrosis. We discussed various treatment options to include no treatment, multiple extractions of teeth in the primary field of radiation therapy with alveoloplasty, pre-prosthetic surgery as indicated, periodontal therapy, dental restorations, root canal therapy, crown and bridge therapy, implant therapy, and replacement of missing teeth as indicated. We also discussed fabrication of a radiation cone locator/bite block, scatter guards, and fluoride trays. We also discussed referral for second  opinion  with an oral surgeon concerning dental extractions. The patient currently agrees to proceed with impressions today for the fabrication of fluoride trays and scatter guards as indicated. Patient does not wish to proceed with dental extractions at this time. Patient agrees to return to dental medicine this Friday for dental restoration, initial periodontal therapy, insertion of fluoride trays and scatter guards, and possible fabrication of radiation cone locator/bite block if needed. A prescription for FluoriSHIELD has been sent to [facility] outpatient pharmacy.  2. Discussion of findings with medical team and coordination of future medical and dental care as needed.  I spent in excess of  120 minutes during the conduct of this consultation and >50% of this time involved direct face-to-face encounter for counseling and/or coordination of the patient's care.    Lenn Cal, DDS

## 2014-08-02 ENCOUNTER — Ambulatory Visit (HOSPITAL_COMMUNITY): Payer: Self-pay | Admitting: Dentistry

## 2014-08-02 ENCOUNTER — Encounter (HOSPITAL_COMMUNITY): Payer: Self-pay | Admitting: Dentistry

## 2014-08-02 VITALS — BP 119/65 | HR 70 | Temp 97.8°F

## 2014-08-02 DIAGNOSIS — K036 Deposits [accretions] on teeth: Secondary | ICD-10-CM

## 2014-08-02 DIAGNOSIS — K0530 Chronic periodontitis, unspecified: Secondary | ICD-10-CM

## 2014-08-02 DIAGNOSIS — K0850 Unsatisfactory restoration of tooth, unspecified: Secondary | ICD-10-CM

## 2014-08-02 DIAGNOSIS — Z01818 Encounter for other preprocedural examination: Secondary | ICD-10-CM

## 2014-08-02 DIAGNOSIS — C080 Malignant neoplasm of submandibular gland: Secondary | ICD-10-CM

## 2014-08-02 NOTE — Progress Notes (Signed)
08/02/2014  Patient:            MORDCHE HEDGLIN Date of Birth:  01/05/1976 MRN:                [id number]   BP 119/65 mmHg  Pulse 70  Temp(Src) 97.8 F (36.6 C) (Oral)  [place] presents for initial periodontal therapy, dental restoration, and insertion of fluoride trays. Prescription for FluoriSHIELD was sent to [facility] outpatient pharmacy. Patient denies any acute dental changes and agrees to proceed with treatment as planned. I discussed my conversation with Dr. Benson Norway (oral surgeon) and Dr. Isidore Moos concerning possible dental extractions of the lower right molars/premolars. We also discussed my conversation with Dr. Isidore Moos concerning tongue positioner. It was felt that the decision to avoid dental extractions at this time by the patient was acceptable. It was determined that a tongue positioner would not aid in the overall treatment of the patient and therefore a mouth prop would be used during the simulation and radiation therapy. Patient expressed understanding with the above information and agreed to proceed with dental treatment as planned-today.  Procedure: 72 g of lidocaine with 0.036 mother grams of epinephrine via infill alveolar nerve block and long buccal nerve block 2.  W6568 MOL/amalgam Slot retention placed. Gluma Tytin alloy. Burnish. Occlusion adjusted as needed. Plan is for crown after radiation therapy.             Patient cautioned about risk for fracture of this tooth. Patient avoid chewing on the right side for 24 hours. Patient is to avoid chewing lip while he is still numb. L2751 Adult prophylaxis. Oral hygiene instructions provided on brushing and flossing. Z0017 Upper and lower fluoride trays. Appliances were tried in and adjusted as needed. [place] Trismus device was previously fabricated at 55 mm and using 35 sticks. Postop instructions were provided and a written and verbal format concerning the use and care of appliances. All questions were  answered. Patient to return to clinic for periodic oral examination once he starts radiation therapy.  Pre-fabricated bite block will be used for stimulation procedure. This will be placed on the right side. This was taken to radiation oncology simulation area today. Patient to call if questions or problems arise before then.  Lenn Cal, DDS

## 2014-08-02 NOTE — Patient Instructions (Addendum)
Brush after meals and at bedtime. Floss at bedtime. Use fluoride and fluoride trays at bedtime as instructed. Trismus exercises as instructed during radiation therapy. Multiple sips of water as needed for dry mouth. Use salt water and baking soda rinses as needed to rinse mouth to aid healing. Use one half teaspoon salt, one half teaspoon of baking soda per 12 ounces. Return to clinic as scheduled for follow-up evaluation during radiation therapy. Call if problems arise before then. Dr. Enrique Sack  FLUORIDE TRAYS PATIENT INSTRUCTIONS    Obtain prescription from the pharmacy.  Don't be surprised if it needs to be ordered.  Be sure to let the pharmacy know when you are close to needing a new refill for them to have it ready for you without interruption of Fluoride use.  The best time to use your Fluoride is before bed time.  You must brush your teeth very well and floss before using the Fluoride in order to get the best use out of the Fluoride treatments.  Place 1 drop of Fluoride gel per tooth in the tray.  Place the tray on your lower teeth and your upper teeth.  Make sure the trays are seated all the way.  Remember, they only fit one way on your teeth.  Insert for 5 full minutes.  At the end of the 5 minutes, take the trays out.  SPIT OUT excess. .  Do NOT rinse your mouth!  Do NOT eat or drink after treatments for at least 30 minutes.  This is why the best time for your treatments is before bedtime.  Clean the inside of your Fluoride trays using COLD WATER and a toothbrush.  In order to keep your Trays from discoloring and free from odors, soak them overnight in denture cleaners such as Efferdent.  Do not use bleach or non denture products.  Store the trays in a safe dry place AWAY from any heat until your next treatment.  If anything happens to your Fluoride trays, or they don't fit as well after any dental work, please let us know as soon as possible.

## 2014-08-09 ENCOUNTER — Other Ambulatory Visit: Payer: Self-pay | Admitting: Otolaryngology

## 2014-08-09 ENCOUNTER — Encounter (HOSPITAL_COMMUNITY): Payer: Self-pay | Admitting: *Deleted

## 2014-08-09 NOTE — H&P (Signed)
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  His family physician is concerned these may be seizures.  He is scheduled to see a neurologist in one month.  He does not think he is having heart rhythm problems nor blood pressure issues.  The spells are not necessarily orthostatic.  He may have some vague premonition that an attack is coming.  No change in hearing.  No vertigo.  He was monitored in the emergency room for 10-12 hours but has not had a Holter monitor or similar.  No obvious relationship to meals.  He did have CT scans of various body parts including head and neck.  There is a large calcific stone in the RIGHT submandibular gland posteriorly.  No obvious stones along the duct.   He has been aware of some slight fullness in the RIGHT upper neck as much as 9 years.  It may be getting slowly larger.  This past year, it is slightly tender to touch, and also some pain with sour foods such as pickles.  No obvious swelling with meals or foods.  He is not diabetic.  No relationship to meals.  The gland has never gotten large and stayed enlarged for several days.  No fever.  No bad taste in the mouth.  No xerostomia.  4 months recheck.  Preoperative evaluation.  We are preparing to perform a RIGHT submandibular gland excision for intraglandular stones.  He continues to have a swollen gland and some tenderness at various times but no obvious swelling with meals.  He has been cleared by both cardiology and neurology for his presumed syncopal episodes.   I discussed the surgery in detail including risks and complications.  Questions were answered and informed consent was obtained.  I discussed postoperative pain control, advancement of diet and activity, and return to work.  I gave him a prescription for Tylenol with codeine pills,  and discussed use of ibuprofen and/or Tylenol by itself for postoperative pain.  I will see him back 10 days after surgery for suture removal.  Four-day status post RIGHT submandibular gland excision.  Starting yesterday, he feels some fullness in his neck as though there might be a fluid accumulation.  No fever.   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  I explained this to the patient and his wife in as much detail as they seemed to need.  We talked about referral to radiation oncology.  At some point he will probably need a PET CT scan.  We will present his case at tumor board next week.   I did speak with Dr. Caryl Never, department chairman at [facility].  His advice was this has been present 9 years and so basically is declaring itself as slow-growing.  It may make some sense to do some soft tissue dissection in the vicinity without specific reference to the unlikely possibility of lymph node metastases.  Perineural  spread it is hard to visualize grossly and radiation may not even be a necessary choice at this point.  10 days postop check.  After 2 or 3 days, the swelling in his neck supple down and he has completed his antibiotics.  He is back to basically full regular diet and activities.  He has occasional brief shooting pains into  the RIGHT ear region of uncertain significance.     He saw Dr. Isidore Moos and several other members of the head and neck oncology team 2 days ago.  We presented his case at the head and neck oncology tumor board 2 days ago.  Right now, the plan is for a baseline MRI scan of his neck to assess for any possible nerve involvement, and CT scan of the neck, chest, abdomen, and pelvis for staging purposes, anticipating a less than satisfactory response with this particular tumor type on PET scan.  If staging is negative, then we anticipate a surgical reexcision for margins and postoperative  radiation therapy.    PMH: Past Medical History  Diagnosis Date  . History of stress test     workup done for syncope   . 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 Syncopal episodes   . Carcinoma 07/2014    adenoid cystic    Surg Hx: Past Surgical History  Procedure Laterality Date  . None    . Submandibular gland excision Right 64/40/3474    DR Erik Obey  . Submandibular gland excision Right 07/12/2014    Procedure: EXCISION RIGHT SUBMANDIBULAR GLAND;  Surgeon: [name], MD;  Location: [facility] OR;  Service: ENT;  Laterality: Right;    FHx:   Family History  Problem Relation Age of Onset  . Adopted: Yes  . Family history unknown: Yes   SocHx:  reports that he quit smoking about 3 years ago. He has quit using smokeless tobacco. His smokeless tobacco use included Snuff. He reports that he drinks alcohol. He reports that he uses illicit drugs (Marijuana).  ALLERGIES:  Allergies  Allergen Reactions  . Hydrocodone Itching     (Not in a hospital admission)  No results found for this or any previous visit (from the past 48 hour(s)). No results found.  QVZ:DGLOVFIE: Feeling tired (fatigue).  No fever.  Night sweats.  No recent weight loss. Head: Headache. Eyes: No eye symptoms. Otolaryngeal: No hearing loss  and no earache.  Tinnitus.  No purulent nasal discharge.  No nasal passage blockage (stuffiness), no snoring, no sneezing, no hoarseness, and no sore throat. Cardiovascular: Chest pain or discomfort.  No palpitations. Pulmonary: No dyspnea, no cough, and no wheezing. Gastrointestinal: No dysphagia.  Heartburn.  No nausea, no abdominal pain, and no melena.  No diarrhea. Genitourinary: No dysuria. Endocrine: No muscle weakness. Musculoskeletal: Calf muscle cramps.  No arthralgias.  Soft tissue swelling. Neurological: No dizziness.  Fainting, tingling, and numbness. Psychological: No anxiety  and no depression. Skin: No rash.  BP:119/71,  HR: 76 b/min,  Height: 6 ft , Weight:  190 lb , BMI: 25.8 kg/m2,   PHYSICAL EXAM: The wound is flat and healing nicely.  He has good hypoglossal, lingual, and ramus mandibularis function.  No palpable neck masses.  Sutures are removed without difficulty.  Lungs: Clear to auscultation Heart: Regular rate and rhythm without murmurs Abdomen: Soft, active Extremities: Normal configuration Neurologic: Symmetric, grossly intact.  Studies Reviewed:  MRI face, CT neck,  chest, abd, pelvis    Assessment/Plan Adenoid Cystic Carcinoma Of Submandibular Gland (142.1) (C08.0).  After discussing your case at our head and oncology conference,   depending on the results of the pending tests, the plan is to do a surgical reexcision and then radiation.  I will call you when I have the results of the testing.  We will probably get around to the surgery within 2 weeks.  You will be out of work after  surgery again roughly 2 weeks.  Depending on whether any dental work needs to be done,  you would probably start your radiation around 6 weeks after surgery.  Acetaminophen-Codeine #3 300-30 MG Oral Tablet;TAKE 1 TO 2 TABLETS EVERY 4 TO 6 HOURS AS NEEDED FOR PAIN.; Rx  [name] 09/11/979, 5:10 PM

## 2014-08-11 MED ORDER — CEFAZOLIN SODIUM-DEXTROSE 2-3 GM-% IV SOLR
2.0000 g | INTRAVENOUS | Status: AC
  Administered 2014-08-12: 2 g via INTRAVENOUS
  Filled 2014-08-11: qty 50

## 2014-08-12 ENCOUNTER — Ambulatory Visit (HOSPITAL_COMMUNITY): Payer: 59 | Admitting: Anesthesiology

## 2014-08-12 ENCOUNTER — Ambulatory Visit (HOSPITAL_COMMUNITY)
Admission: RE | Admit: 2014-08-12 | Discharge: 2014-08-14 | Disposition: A | Discharge status: Home / Self Care | Payer: 59 | Source: Ambulatory Visit | Attending: Otolaryngology | Admitting: Otolaryngology

## 2014-08-12 ENCOUNTER — Encounter (HOSPITAL_COMMUNITY): Payer: Self-pay

## 2014-08-12 ENCOUNTER — Encounter (HOSPITAL_COMMUNITY)
Admission: RE | Disposition: A | Discharge status: Home / Self Care | Payer: Self-pay | Source: Ambulatory Visit | Attending: Otolaryngology

## 2014-08-12 DIAGNOSIS — Z885 Allergy status to narcotic agent status: Secondary | ICD-10-CM | POA: Insufficient documentation

## 2014-08-12 DIAGNOSIS — Z87891 Personal history of nicotine dependence: Secondary | ICD-10-CM | POA: Insufficient documentation

## 2014-08-12 DIAGNOSIS — C080 Malignant neoplasm of submandibular gland: Secondary | ICD-10-CM | POA: Insufficient documentation

## 2014-08-12 HISTORY — PX: SUBMANDIBULAR GLAND EXCISION: SHX2456

## 2014-08-12 LAB — COMPREHENSIVE METABOLIC PANEL
ALK PHOS: 70 U/L (ref 39–117)
ALT: 33 U/L (ref 0–53)
ANION GAP: 6 (ref 5–15)
AST: 34 U/L (ref 0–37)
Albumin: 4.4 g/dL (ref 3.5–5.2)
BUN: 17 mg/dL (ref 6–23)
CALCIUM: 9.5 mg/dL (ref 8.4–10.5)
CO2: 26 mmol/L (ref 19–32)
Chloride: 109 mmol/L (ref 96–112)
Creatinine, Ser: 0.88 mg/dL (ref 0.50–1.35)
GFR calc Af Amer: >90 mL/min (ref >90)
GFR calc non Af Amer: >90 mL/min (ref >90)
Glucose, Bld: 89 mg/dL (ref 70–99)
POTASSIUM: 3.8 mmol/L (ref 3.5–5.1)
SODIUM: 141 mmol/L (ref 135–145)
TOTAL PROTEIN: 7.4 g/dL (ref 6.0–8.3)
Total Bilirubin: 0.6 mg/dL (ref 0.3–1.2)

## 2014-08-12 LAB — CBC
HCT: 45.8 % (ref 39.0–52.0)
Hemoglobin: 15.6 g/dL (ref 13.0–17.0)
MCH: 29.4 pg (ref 26.0–34.0)
MCHC: 34.1 g/dL (ref 30.0–36.0)
MCV: 86.3 fL (ref 78.0–100.0)
Platelets: 184 10*3/uL (ref 150–400)
RBC: 5.31 MIL/uL (ref 4.22–5.81)
RDW: 13.2 % (ref 11.5–15.5)
WBC: 7.6 10*3/uL (ref 4.0–10.5)

## 2014-08-12 SURGERY — EXCISION, SUBMANDIBULAR GLAND
Anesthesia: General | Site: Neck | Laterality: Right

## 2014-08-12 MED ORDER — PROPOFOL 10 MG/ML IV BOLUS
INTRAVENOUS | Status: AC
  Filled 2014-08-12: qty 20

## 2014-08-12 MED ORDER — IBUPROFEN 400 MG PO TABS
400.0000 mg | ORAL_TABLET | ORAL | Status: DC | PRN
  Administered 2014-08-12 – 2014-08-14 (×6): 400 mg via ORAL
  Filled 2014-08-12 (×6): qty 1

## 2014-08-12 MED ORDER — FENTANYL CITRATE 0.05 MG/ML IJ SOLN
INTRAMUSCULAR | Status: AC
  Filled 2014-08-12: qty 5

## 2014-08-12 MED ORDER — ARTIFICIAL TEARS OP OINT
TOPICAL_OINTMENT | OPHTHALMIC | Status: AC
  Filled 2014-08-12: qty 3.5

## 2014-08-12 MED ORDER — HYDROMORPHONE HCL 1 MG/ML IJ SOLN
INTRAMUSCULAR | Status: AC
  Filled 2014-08-12: qty 1

## 2014-08-12 MED ORDER — MIDAZOLAM HCL 5 MG/5ML IJ SOLN
INTRAMUSCULAR | Status: DC | PRN
  Administered 2014-08-12: 2 mg via INTRAVENOUS

## 2014-08-12 MED ORDER — ONDANSETRON HCL 4 MG/2ML IJ SOLN
INTRAMUSCULAR | Status: AC
  Filled 2014-08-12: qty 2

## 2014-08-12 MED ORDER — PROPOFOL 10 MG/ML IV BOLUS
INTRAVENOUS | Status: DC | PRN
  Administered 2014-08-12: 200 mg via INTRAVENOUS

## 2014-08-12 MED ORDER — HYDROCODONE-ACETAMINOPHEN 5-325 MG PO TABS: 1.0000 | ORAL_TABLET | ORAL | Status: DC | PRN

## 2014-08-12 MED ORDER — MIDAZOLAM HCL 2 MG/2ML IJ SOLN
INTRAMUSCULAR | Status: AC
  Filled 2014-08-12: qty 2

## 2014-08-12 MED ORDER — GLYCOPYRROLATE 0.2 MG/ML IJ SOLN
INTRAMUSCULAR | Status: DC | PRN
  Administered 2014-08-12: .3 mg via INTRAVENOUS

## 2014-08-12 MED ORDER — NEOSTIGMINE METHYLSULFATE 10 MG/10ML IV SOLN
INTRAVENOUS | Status: DC | PRN
  Administered 2014-08-12: 2 mg via INTRAVENOUS

## 2014-08-12 MED ORDER — OXYCODONE HCL 5 MG/5ML PO SOLN: 5.0000 mg | Freq: Once | ORAL | Status: DC | PRN

## 2014-08-12 MED ORDER — SUCCINYLCHOLINE CHLORIDE 20 MG/ML IJ SOLN
INTRAMUSCULAR | Status: AC
  Filled 2014-08-12: qty 1

## 2014-08-12 MED ORDER — EPHEDRINE SULFATE 50 MG/ML IJ SOLN
INTRAMUSCULAR | Status: AC
  Filled 2014-08-12: qty 1

## 2014-08-12 MED ORDER — ROCURONIUM BROMIDE 100 MG/10ML IV SOLN
INTRAVENOUS | Status: DC | PRN
  Administered 2014-08-12: 50 mg via INTRAVENOUS

## 2014-08-12 MED ORDER — DEXAMETHASONE SODIUM PHOSPHATE 4 MG/ML IJ SOLN
INTRAMUSCULAR | Status: AC
  Filled 2014-08-12: qty 1

## 2014-08-12 MED ORDER — FENTANYL CITRATE 0.05 MG/ML IJ SOLN
INTRAMUSCULAR | Status: DC | PRN
  Administered 2014-08-12: 150 ug via INTRAVENOUS
  Administered 2014-08-12 (×2): 100 ug via INTRAVENOUS
  Administered 2014-08-12: 50 ug via INTRAVENOUS

## 2014-08-12 MED ORDER — NEOSTIGMINE METHYLSULFATE 10 MG/10ML IV SOLN
INTRAVENOUS | Status: AC
  Filled 2014-08-12: qty 1

## 2014-08-12 MED ORDER — LACTATED RINGERS IV SOLN
INTRAVENOUS | Status: DC | PRN
  Administered 2014-08-12 (×2): via INTRAVENOUS

## 2014-08-12 MED ORDER — GLYCOPYRROLATE 0.2 MG/ML IJ SOLN
INTRAMUSCULAR | Status: AC
Start: 2014-08-12 — End: 2014-08-12
  Filled 2014-08-12: qty 2

## 2014-08-12 MED ORDER — LIDOCAINE-EPINEPHRINE 1 %-1:100000 IJ SOLN
INTRAMUSCULAR | Status: DC | PRN
  Administered 2014-08-12: 20 mL

## 2014-08-12 MED ORDER — LIDOCAINE HCL (CARDIAC) 20 MG/ML IV SOLN
INTRAVENOUS | Status: AC
  Filled 2014-08-12: qty 10

## 2014-08-12 MED ORDER — ONDANSETRON HCL 4 MG/2ML IJ SOLN
INTRAMUSCULAR | Status: DC | PRN
  Administered 2014-08-12: 4 mg via INTRAVENOUS

## 2014-08-12 MED ORDER — OXYCODONE HCL 5 MG PO TABS: 5.0000 mg | ORAL_TABLET | Freq: Once | ORAL | Status: DC | PRN

## 2014-08-12 MED ORDER — SODIUM CHLORIDE 0.9 % IJ SOLN
INTRAMUSCULAR | Status: AC
  Filled 2014-08-12: qty 10

## 2014-08-12 MED ORDER — CHLORHEXIDINE GLUCONATE 4 % EX LIQD: 1.0000 "application " | Freq: Once | CUTANEOUS | Status: DC

## 2014-08-12 MED ORDER — GLYCOPYRROLATE 0.2 MG/ML IJ SOLN
INTRAMUSCULAR | Status: AC
  Filled 2014-08-12: qty 2

## 2014-08-12 MED ORDER — ONDANSETRON HCL 4 MG/2ML IJ SOLN: 4.0000 mg | Freq: Four times a day (QID) | INTRAMUSCULAR | Status: DC | PRN

## 2014-08-12 MED ORDER — HYDROMORPHONE HCL 1 MG/ML IJ SOLN
0.2500 mg | INTRAMUSCULAR | Status: DC | PRN
  Administered 2014-08-12: 0.5 mg via INTRAVENOUS
  Administered 2014-08-12: 1 mg via INTRAVENOUS

## 2014-08-12 MED ORDER — LIDOCAINE-EPINEPHRINE 1 %-1:100000 IJ SOLN
INTRAMUSCULAR | Status: AC
  Filled 2014-08-12: qty 1

## 2014-08-12 MED ORDER — 0.9 % SODIUM CHLORIDE (POUR BTL) OPTIME
TOPICAL | Status: DC | PRN
  Administered 2014-08-12: 1000 mL

## 2014-08-12 MED ORDER — BACITRACIN ZINC 500 UNIT/GM EX OINT
TOPICAL_OINTMENT | CUTANEOUS | Status: AC
  Filled 2014-08-12: qty 28.35

## 2014-08-12 MED ORDER — PHENOL 1.4 % MT LIQD
1.0000 | OROMUCOSAL | Status: DC | PRN
  Administered 2014-08-13: 1 via OROMUCOSAL
  Filled 2014-08-12: qty 177

## 2014-08-12 MED ORDER — LIDOCAINE HCL (CARDIAC) 20 MG/ML IV SOLN
INTRAVENOUS | Status: DC | PRN
  Administered 2014-08-12: 100 mg via INTRAVENOUS

## 2014-08-12 MED ORDER — ACETAMINOPHEN-CODEINE #3 300-30 MG PO TABS
1.0000 | ORAL_TABLET | ORAL | Status: DC | PRN
  Administered 2014-08-12: 2 via ORAL
  Filled 2014-08-12: qty 2

## 2014-08-12 MED ORDER — ROCURONIUM BROMIDE 50 MG/5ML IV SOLN
INTRAVENOUS | Status: AC
  Filled 2014-08-12: qty 1

## 2014-08-12 MED ORDER — BACITRACIN ZINC 500 UNIT/GM EX OINT
1.0000 "application " | TOPICAL_OINTMENT | Freq: Three times a day (TID) | CUTANEOUS | Status: DC
  Administered 2014-08-12 – 2014-08-14 (×5): 1 via TOPICAL
  Filled 2014-08-12: qty 28.35

## 2014-08-12 MED ORDER — DEXTROSE-NACL 5-0.45 % IV SOLN
INTRAVENOUS | Status: DC
  Administered 2014-08-12: 22:00:00 via INTRAVENOUS
  Administered 2014-08-12: 125 mL/h via INTRAVENOUS

## 2014-08-12 SURGICAL SUPPLY — 52 items
ATTRACTOMAT 16X20 MAGNETIC DRP (DRAPES) ×1 IMPLANT
BLADE SURG 15 STRL LF DISP TIS (BLADE) IMPLANT
BLADE SURG 15 STRL SS (BLADE) ×2
CANISTER SUCTION 2500CC (MISCELLANEOUS) ×2 IMPLANT
CLEANER TIP ELECTROSURG 2X2 (MISCELLANEOUS) ×2 IMPLANT
CONT SPEC 4OZ CLIKSEAL STRL BL (MISCELLANEOUS) ×2 IMPLANT
CORDS BIPOLAR (ELECTRODE) ×1 IMPLANT
COVER SURGICAL LIGHT HANDLE (MISCELLANEOUS) ×2 IMPLANT
CRADLE DONUT ADULT HEAD (MISCELLANEOUS) ×1 IMPLANT
DRAIN SNY 10 ROU (WOUND CARE) ×2 IMPLANT
DRAIN WOUND SNY 15 RND (WOUND CARE) ×1 IMPLANT
DRAPE PROXIMA HALF (DRAPES) ×1 IMPLANT
ELECT COATED BLADE 2.86 ST (ELECTRODE) ×2 IMPLANT
ELECT REM PT RETURN 9FT ADLT (ELECTROSURGICAL) ×2
ELECTRODE REM PT RTRN 9FT ADLT (ELECTROSURGICAL) ×1 IMPLANT
EVACUATOR SILICONE 100CC (DRAIN) ×1 IMPLANT
GAUZE SPONGE 4X4 16PLY XRAY LF (GAUZE/BANDAGES/DRESSINGS) ×3 IMPLANT
GLOVE BIO SURGEON STRL SZ 6.5 (GLOVE) ×1 IMPLANT
GLOVE BIO SURGEON STRL SZ7 (GLOVE) ×2 IMPLANT
GLOVE BIO SURGEON STRL SZ7.5 (GLOVE) ×1 IMPLANT
GLOVE BIOGEL PI IND STRL 7.0 (GLOVE) IMPLANT
GLOVE BIOGEL PI INDICATOR 7.0 (GLOVE) ×2
GLOVE BIOGEL PI ORTHO PRO SZ7 (GLOVE) ×1
GLOVE ECLIPSE 8.0 STRL XLNG CF (GLOVE) ×2 IMPLANT
GLOVE PI ORTHO PRO STRL SZ7 (GLOVE) IMPLANT
GLOVE SURG SS PI 6.5 STRL IVOR (GLOVE) ×1 IMPLANT
GOWN STRL REUS W/ TWL LRG LVL3 (GOWN DISPOSABLE) ×2 IMPLANT
GOWN STRL REUS W/ TWL XL LVL3 (GOWN DISPOSABLE) ×1 IMPLANT
GOWN STRL REUS W/TWL LRG LVL3 (GOWN DISPOSABLE) ×8
GOWN STRL REUS W/TWL XL LVL3 (GOWN DISPOSABLE) ×2
KIT BASIN OR (CUSTOM PROCEDURE TRAY) ×2 IMPLANT
KIT ROOM TURNOVER OR (KITS) ×2 IMPLANT
LOCATOR NERVE 3 VOLT (DISPOSABLE) ×1 IMPLANT
NDL HYPO 25GX1X1/2 BEV (NEEDLE) ×1 IMPLANT
NEEDLE HYPO 25GX1X1/2 BEV (NEEDLE) ×2 IMPLANT
NS IRRIG 1000ML POUR BTL (IV SOLUTION) ×2 IMPLANT
PAD ARMBOARD 7.5X6 YLW CONV (MISCELLANEOUS) ×4 IMPLANT
PENCIL BUTTON HOLSTER BLD 10FT (ELECTRODE) ×2 IMPLANT
STAPLER VISISTAT 35W (STAPLE) ×2 IMPLANT
SUT CHROMIC 4 0 PS 2 18 (SUTURE) ×1 IMPLANT
SUT ETHILON 3 0 PS 1 (SUTURE) ×3 IMPLANT
SUT ETHILON 5 0 PS 2 18 (SUTURE) ×1 IMPLANT
SUT ETHILON 6 0 P 1 (SUTURE) ×1 IMPLANT
SUT SILK 0 TIES 10X30 (SUTURE) IMPLANT
SUT SILK 2 0 SH CR/8 (SUTURE) ×1 IMPLANT
SUT SILK 3 0 REEL (SUTURE) ×2 IMPLANT
SUT SILK 4 0 REEL (SUTURE) IMPLANT
SUT VIC AB 4-0 PS2 27 (SUTURE) ×3 IMPLANT
SYR CONTROL 10ML LL (SYRINGE) ×2 IMPLANT
TOWEL OR 17X24 6PK STRL BLUE (TOWEL DISPOSABLE) ×2 IMPLANT
TRAY ENT MC OR (CUSTOM PROCEDURE TRAY) ×2 IMPLANT
TUBE CONNECTING 12X1/4 (SUCTIONS) ×1 IMPLANT

## 2014-08-12 NOTE — Interval H&P Note (Signed)
History and Physical Interval Note:  08/12/2014 10:22 AM  [name]  has presented today for surgery, with the diagnosis of ADENOID CYSTIC  CARCINOMA RIGHT SUBMANDIBULAR GLAND  The various methods of treatment have been discussed with the patient and family. After consideration of risks, benefits and other options for treatment, the patient has consented to  Procedure(s): WIDE LOCAL EXCISION RIGHT  SUBMANDIBULAR GLAND (Right) as a surgical intervention .  The patient's history has been re-reviewed, patient re-examined, no change in status, stable for surgery.  I have re-reviewed the patient's chart and labs.  Questions were answered to the patient's satisfaction.     [name]

## 2014-08-12 NOTE — Transfer of Care (Signed)
Immediate Anesthesia Transfer of Care Note  Patient: [name]  Procedure(s) Performed: Procedure(s): Selected neck dissection (Right)  Patient Location: PACU  Anesthesia Type:General  Level of Consciousness: awake, alert  and oriented  Airway & Oxygen Therapy: Patient Spontanous Breathing and Patient connected to nasal cannula oxygen  Post-op Assessment: Report given to RN and Post -op Vital signs reviewed and stable  Post vital signs: Reviewed and stable  Last Vitals:  Filed Vitals:   08/12/14 1305  BP: 139/74  Pulse: 83  Temp: 36.7 C  Resp: 14    Complications: No apparent anesthesia complications

## 2014-08-12 NOTE — Progress Notes (Signed)
   ENT Progress Note: s/p Procedure(s): Rt selective neck dissection   Subjective: C/O neck pain  Objective: Vital signs in last 24 hours: Temp:  [97.9 F (36.6 C)-98.6 F (37 C)] 98.2 F (36.8 C) (04/04 1452) Pulse Rate:  [65-83] 66 (04/04 1452) Resp:  [13-18] 14 (04/04 1452) BP: (116-139)/(73-82) 139/82 mmHg (04/04 1452) SpO2:  [96 %-100 %] 98 % (04/04 1452) Weight:  [87.998 kg (194 lb)-92.08 kg (203 lb)] 92.08 kg (203 lb) (04/04 1452) Weight change:     Intake/Output from previous day:   Intake/Output this shift: Total I/O In: 1400 [I.V.:1400] Out: 143 [Drains:143]  Labs:  Recent Labs  08/12/14 0803  WBC 7.6  HGB 15.6  HCT 45.8  PLT 184    Recent Labs  08/12/14 0803  NA 141  K 3.8  CL 109  CO2 26  GLUCOSE 89  BUN 17  CALCIUM 9.5    Studies/Results: No results found.   PHYSICAL EXAM: Inc intact No erythema or swelling JP intact and functional   Assessment/Plan: Pt stable postop Monitor pain and treat Po as tolerated    Ren Grasse 08/12/2014, 4:10 PM

## 2014-08-12 NOTE — Anesthesia Preprocedure Evaluation (Signed)
Anesthesia Evaluation  Patient identified by MRN, date of birth, ID band Patient awake    Reviewed: Allergy & Precautions, H&P , NPO status , Patient's Chart, lab work & pertinent test results  Airway Mallampati: II       Dental   Pulmonary former smoker,  breath sounds clear to auscultation        Cardiovascular negative cardio ROS  Rhythm:regular Rate:Normal     Neuro/Psych    GI/Hepatic   Endo/Other    Renal/GU      Musculoskeletal   Abdominal   Peds  Hematology   Anesthesia Other Findings   Reproductive/Obstetrics                             Anesthesia Physical Anesthesia Plan  ASA: II  Anesthesia Plan: General   Post-op Pain Management:    Induction: Intravenous  Airway Management Planned: Oral ETT  Additional Equipment:   Intra-op Plan:   Post-operative Plan: Extubation in OR  Informed Consent: I have reviewed the patients History and Physical, chart, labs and discussed the procedure including the risks, benefits and alternatives for the proposed anesthesia with the patient or authorized representative who has indicated his/her understanding and acceptance.     Plan Discussed with: CRNA, Anesthesiologist and Surgeon  Anesthesia Plan Comments:         Anesthesia Quick Evaluation

## 2014-08-12 NOTE — H&P (View-Only) (Signed)
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  His family physician is concerned these may be seizures.  He is scheduled to see a neurologist in one month.  He does not think he is having heart rhythm problems nor blood pressure issues.  The spells are not necessarily orthostatic.  He may have some vague premonition that an attack is coming.  No change in hearing.  No vertigo.  He was monitored in the emergency room for 10-12 hours but has not had a Holter monitor or similar.  No obvious relationship to meals.  He did have CT scans of various body parts including head and neck.  There is a large calcific stone in the RIGHT submandibular gland posteriorly.  No obvious stones along the duct.   He has been aware of some slight fullness in the RIGHT upper neck as much as 9 years.  It may be getting slowly larger.  This past year, it is slightly tender to touch, and also some pain with sour foods such as pickles.  No obvious swelling with meals or foods.  He is not diabetic.  No relationship to meals.  The gland has never gotten large and stayed enlarged for several days.  No fever.  No bad taste in the mouth.  No xerostomia.  4 months recheck.  Preoperative evaluation.  We are preparing to perform a RIGHT submandibular gland excision for intraglandular stones.  He continues to have a swollen gland and some tenderness at various times but no obvious swelling with meals.  He has been cleared by both cardiology and neurology for his presumed syncopal episodes.   I discussed the surgery in detail including risks and complications.  Questions were answered and informed consent was obtained.  I discussed postoperative pain control, advancement of diet and activity, and return to work.  I gave him a prescription for Tylenol with codeine pills,  and discussed use of ibuprofen and/or Tylenol by itself for postoperative pain.  I will see him back 10 days after surgery for suture removal.  Four-day status post RIGHT submandibular gland excision.  Starting yesterday, he feels some fullness in his neck as though there might be a fluid accumulation.  No fever.   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  I explained this to the patient and his wife in as much detail as they seemed to need.  We talked about referral to radiation oncology.  At some point he will probably need a PET CT scan.  We will present his case at tumor board next week.   I did speak with Dr. Caryl Never, department chairman at [facility]  His advice was this has been present 9 years and so basically is declaring itself as slow-growing.  It may make some sense to do some soft tissue dissection in the vicinity without specific reference to the unlikely possibility of lymph node metastases.  Perineural  spread it is hard to visualize grossly and radiation may not even be a necessary choice at this point.  10 days postop check.  After 2 or 3 days, the swelling in his neck supple down and he has completed his antibiotics.  He is back to basically full regular diet and activities.  He has occasional brief shooting pains into  the RIGHT ear region of uncertain significance.     He saw Dr. Isidore Moos and several other members of the head and neck oncology team 2 days ago.  We presented his case at the head and neck oncology tumor board 2 days ago.  Right now, the plan is for a baseline MRI scan of his neck to assess for any possible nerve involvement, and CT scan of the neck, chest, abdomen, and pelvis for staging purposes, anticipating a less than satisfactory response with this particular tumor type on PET scan.  If staging is negative, then we anticipate a surgical reexcision for margins and postoperative  radiation therapy.    PMH: Past Medical History  Diagnosis Date  . History of stress test     workup done for syncope   . 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 Syncopal episodes   . Carcinoma 07/2014    adenoid cystic    Surg Hx: Past Surgical History  Procedure Laterality Date  . None    . Submandibular gland excision Right 16/02/9603    DR Erik Obey  . Submandibular gland excision Right 07/12/2014    Procedure: EXCISION RIGHT SUBMANDIBULAR GLAND;  Surgeon: [name], MD;  Location: [facility] OR;  Service: ENT;  Laterality: Right;    FHx:   Family History  Problem Relation Age of Onset  . Adopted: Yes  . Family history unknown: Yes   SocHx:  reports that he quit smoking about 3 years ago. He has quit using smokeless tobacco. His smokeless tobacco use included Snuff. He reports that he drinks alcohol. He reports that he uses illicit drugs (Marijuana).  ALLERGIES:  Allergies  Allergen Reactions  . Hydrocodone Itching     (Not in a hospital admission)  No results found for this or any previous visit (from the past 48 hour(s)). No results found.  VWU:JWJXBJYN: Feeling tired (fatigue).  No fever.  Night sweats.  No recent weight loss. Head: Headache. Eyes: No eye symptoms. Otolaryngeal: No hearing loss  and no earache.  Tinnitus.  No purulent nasal discharge.  No nasal passage blockage (stuffiness), no snoring, no sneezing, no hoarseness, and no sore throat. Cardiovascular: Chest pain or discomfort.  No palpitations. Pulmonary: No dyspnea, no cough, and no wheezing. Gastrointestinal: No dysphagia.  Heartburn.  No nausea, no abdominal pain, and no melena.  No diarrhea. Genitourinary: No dysuria. Endocrine: No muscle weakness. Musculoskeletal: Calf muscle cramps.  No arthralgias.  Soft tissue swelling. Neurological: No dizziness.  Fainting, tingling, and numbness. Psychological: No anxiety  and no depression. Skin: No rash.  BP:119/71,  HR: 76 b/min,  Height: 6 ft , Weight:  190 lb , BMI: 25.8 kg/m2,   PHYSICAL EXAM: The wound is flat and healing nicely.  He has good hypoglossal, lingual, and ramus mandibularis function.  No palpable neck masses.  Sutures are removed without difficulty.  Lungs: Clear to auscultation Heart: Regular rate and rhythm without murmurs Abdomen: Soft, active Extremities: Normal configuration Neurologic: Symmetric, grossly intact.  Studies Reviewed:  MRI face, CT neck,  chest, abd, pelvis    Assessment/Plan Adenoid Cystic Carcinoma Of Submandibular Gland (142.1) (C08.0).  After discussing your case at our head and oncology conference,   depending on the results of the pending tests, the plan is to do a surgical reexcision and then radiation.  I will call you when I have the results of the testing.  We will probably get around to the surgery within 2 weeks.  You will be out of work after  surgery again roughly 2 weeks.  Depending on whether any dental work needs to be done,  you would probably start your radiation around 6 weeks after surgery.  Acetaminophen-Codeine #3 300-30 MG Oral Tablet;TAKE 1 TO 2 TABLETS EVERY 4 TO 6 HOURS AS NEEDED FOR PAIN.; Rx  [name] 05/15/9676, 5:10 PM

## 2014-08-12 NOTE — Anesthesia Postprocedure Evaluation (Signed)
Anesthesia Post Note  Patient: [name]  Procedure(s) Performed: Procedure(s) (LRB): Selected neck dissection (Right)  Anesthesia type: General  Patient location: PACU  Post pain: Pain level controlled and Adequate analgesia  Post assessment: Post-op Vital signs reviewed, Patient's Cardiovascular Status Stable, Respiratory Function Stable, Patent Airway and Pain level controlled  Last Vitals:  Filed Vitals:   08/12/14 1314  BP:   Pulse: 82  Temp:   Resp: 14    Post vital signs: Reviewed and stable  Level of consciousness: awake, alert  and oriented  Complications: No apparent anesthesia complications

## 2014-08-12 NOTE — Op Note (Signed)
08/12/2014  12:54 PM    Darcella Gasman  [id number]   Pre-Op Dx:  Adenoid cystic carcinoma, right submandibular gland with positive margins  Post-op Dx: Same  Proc: Right level I, 2 neck selective dissection   Surg:  [name] T MD  Anes:  GOT  EBL:  50 mL  Comp:  None  Findings:  Fibrosis consistent with previous surgery. 1.5 x 1.5 x 2.5 cm right level II soft lymph node. Hypoglossal nerve identified and preserved. Ramus mandibularis nerve reapproximated. Spinal accessory nerve and jugular vein preserved.  Procedure: With the patient in a comfortable supine position, general orotracheal anesthesia was induced without difficulty. At an appropriate level, the head was rotated to the left for access to the right neck. A shoulder roll was placed and the neck extended and the head supported in the standard fashion. The previous wound was identified and infiltrated with 1% Xylocaine with 1 100,000 epinephrine, 10 mL's total. A sterile preparation and draping of the right neck was accomplished.  The previous wound was excised and the incision was extended 1-2 centimeters anteriorly.  The dissection was carried down and through the platysma muscle inferiorly. Superiorly, a flap was elevated superficial to the platysma muscle and carried up onto the angle and body of the mandible. The dissection was carried forward at the level of the hyoid bone and up along the anterior belly of the digastric muscle. Posteriorly, it was carried down to the sternocleidomastoid muscle and the fascia enveloping the muscle was rolled forward . The parotid tail was cut between the angle of the mandible in the mastoid tip. Dissecting along the body of the mandible, the facial artery and vein were identified and controlled with silk ligature. A branch of ramus mandibularis had been lysed and was identified.  Working deep to the body of the mandible, and working up from the anterior belly of the digastric muscle and  carrying posterior to the tendon, specimen was worked off of the mylohyoid muscle. The hypoglossal nerve was identified and preserved. Branches of ranine veins were controlled with silk ligature. Working posteriorly, the specimen was rolled off the sternomastoid muscle on the spinal accessory nerve was identified in the lateral surface of the jugular vein. This was carefully dissected and preserved. The soft tissue on the surface of the jugular vein was dissected and there was one prominent lymph node which was allowed to remain with the specimen. The specimen was rolled forward off the jugular vein. Working down the posterior belly of the digastric muscle, the specimen was freed up from the submandibular triangle. The inferior aspect of the facial artery and vein were identified and controlled with silk ligature and at this point the specimen was freed. Orienting sutures had been placed at various stages during the dissection.   Hemostasis was observed.  The one branch of the ramus mandibularis nerve was reapproximated with 6-0 nylon suture.  The wound was thoroughly irrigated. Valsalva revealed no significant bleeding sites. A 15 French perforated round drain was placed in the posterior neck skin and brought into the wound. It was secured at the skin with a 4-0 Ethilon stitch.  The shoulder roll was removed. The wound was closed with 4-0 Vicryl and finally a running simple 5-0 Ethilon at the skin surface in a cosmetic fashion. The drain was observed to be functioning. Again hemostasis was observed.   Dispo:   PACU to 6 N. or similar   Plan:  Ice, elevation, suction drainage. Will await pathological  confirmation of the status of the specimen especially as regards the visible lymph node and possible perineural extension.   [name] MD

## 2014-08-13 ENCOUNTER — Encounter (HOSPITAL_COMMUNITY): Payer: Self-pay | Admitting: Otolaryngology

## 2014-08-13 DIAGNOSIS — C080 Malignant neoplasm of submandibular gland: Secondary | ICD-10-CM | POA: Diagnosis not present

## 2014-08-13 MED ORDER — PHENOL 1.4 % MT LIQD: 2.0000 | Freq: Four times a day (QID) | OROMUCOSAL | Status: DC | PRN

## 2014-08-13 MED ORDER — ONDANSETRON HCL 4 MG/2ML IJ SOLN: 4.0000 mg | Freq: Four times a day (QID) | INTRAMUSCULAR | Status: DC | PRN

## 2014-08-13 NOTE — Progress Notes (Signed)
08/13/2014 9:06 AM  [name] [id number]  Post-Op Day 1    Temp:  [97.9 F (36.6 C)-98.3 F (36.8 C)] 98.3 F (36.8 C) (04/05 0538) Pulse Rate:  [56-83] 56 (04/05 0538) Resp:  [13-17] 16 (04/05 0538) BP: (94-139)/(60-82) 94/63 mmHg (04/05 0538) SpO2:  [96 %-100 %] 97 % (04/05 0538) Weight:  [92.08 kg (203 lb)] 92.08 kg (203 lb) (04/04 1452),     Intake/Output Summary (Last 24 hours) at 08/13/14 0906 Last data filed at 08/13/14 1638  Gross per 24 hour  Intake 2617.08 ml  Output    263 ml  Net 2354.08 ml   JP 243 ml   No results found for this or any previous visit (from the past 24 hour(s)).  SUBJECTIVE:  Min pain.  No SOB.  Eating/drinking.  Voiding well  OBJECTIVE:  Wound flat. Drain functional.  Mild RIGHT corner of mouth weakness  IMPRESSION:  Satisfactory check  PLAN:  Continue suction drainage.  Await pathology report.    [name]

## 2014-08-14 DIAGNOSIS — C080 Malignant neoplasm of submandibular gland: Secondary | ICD-10-CM | POA: Diagnosis not present

## 2014-08-14 NOTE — Progress Notes (Signed)
Discharge instructions gone over with patient. Patient demonstrated knowledge of emptying drain. Home medications gone over. Follow up appointment to be made. Signs and symptoms of infection gone over. Diet, activity, and incisional care gone over. Patient verbalized understanding of instructions.

## 2014-08-14 NOTE — Discharge Summary (Signed)
  08/14/2014 9:01 AM  [name] [id number]  Post-Op Day 2    Temp:  [97.7 F (36.5 C)-98.5 F (36.9 C)] 97.7 F (36.5 C) (04/06 0535) Pulse Rate:  [56-82] 71 (04/06 0535) Resp:  [15-16] 16 (04/06 0535) BP: (100-121)/(69-76) 108/71 mmHg (04/06 0535) SpO2:  [98 %-99 %] 99 % (04/06 0535),     Intake/Output Summary (Last 24 hours) at 08/14/14 0901 Last data filed at 08/14/14 0540  Gross per 24 hour  Intake    333 ml  Output    125 ml  Net    208 ml   JP 125 ml  No results found for this or any previous visit (from the past 24 hour(s)).  SUBJECTIVE:  Min pain.  Tongue sl numb.  Smile crooked.    OBJECTIVE:  Wound flat.  Drain functional. More serous drainage today. CN XII intact.  R. Mandibularis weak.  IMPRESSION:  Satisfactory check.  Too much drainage to remove drain today.  PLAN:  Pt comfortable with drain management at home.  Will discharge to home today, remove drain in 1-2 days  Admit:  4 APR Discharge:  6 APR Final Diagnosis:  Adenoid Cystic Carcinoma, RIGHT submandibular gland Proc:  RIGHT selective neck dissection Comp:  None Cond: ambulatory. Pain controlled.  Taking full regular diet.  Drain in place.  Recheck: 1-2 days for drain removal Rx's:  Tylenol #3 Instructions written and given Pathology reports pending  Hosp Course:  Underwent RIGHT neck selective dissection, levels Ia, Ib, IIa on day of admission.  Had fairly large serosanguineous drainage through  POD 1.  By POD 2 less drainage and more serous quality.  Discharged to home and care of family.    [name]

## 2014-08-14 NOTE — Discharge Instructions (Signed)
Sleep with head elevated x 3-4 nights Clean wound with Q-tip and water twice daily, then apply a thin coat of antibiotic ointment OK to shower when you get home Call for excessive pain, any signs of infection, bleeding. [place] my office for suture removal 10 days. Empty the drain 2-3 times daily and measure cumulative 24 hr output.  If less than 1 oz (30 ml), we will remove drain on Thursday.  If more than 1 oz, then we will remove drain on Friday.  Call [phone] with a progress report.   I will call to discuss the Path reports when available.

## 2014-08-16 ENCOUNTER — Telehealth: Payer: Self-pay | Admitting: *Deleted

## 2014-08-16 NOTE — Telephone Encounter (Signed)
Patient called to inform: 1. He is home, recovering well from [facility] surgery.   2. He received word from Dr. Erik Obey that bx results reflect no evidence of additional cancer. 3. Dr. Erik Obey will contact Dr. Isidore Moos for discussion re: RT.  Gayleen Orem, RN, BSN, [place] at [place] [phone]

## 2014-09-06 ENCOUNTER — Telehealth: Payer: Self-pay | Admitting: *Deleted

## 2014-09-06 NOTE — Telephone Encounter (Signed)
Called patient to check on welfare since surgery earlier this month. He reported:  Doing well since surgery.  Had follow-up appt with Dr. Erik Obey last week.  Has appt scheduled with Dr. Macky Lower, [facility], next Wed at 10:30 for second opinion re: need for RT.  He indicated he would call me after this appt to provide an update.  Gayleen Orem, RN, BSN, [place] at [place] [phone]

## 2014-09-16 ENCOUNTER — Telehealth: Payer: Self-pay | Admitting: *Deleted

## 2014-09-16 NOTE — Telephone Encounter (Signed)
Called patient to follow-up on his appt with Dr. Vicie Mutters last week to obtain a second opinion re: need for RT. 1. He reported:  Dr. Vicie Mutters was of the opinion that post-surgical RT was not necessary at this time d/t clean surgical margins.  He has a follow-up appt with Dr. Erik Obey on 0/05/11.  I encouraged him to discuss surveillance plan. 2. He understands he can contact me with future needs/concerns.  Gayleen Orem, RN, BSN, [place] at [place] [phone]

## 2015-03-19 ENCOUNTER — Other Ambulatory Visit: Payer: Self-pay | Admitting: Otolaryngology

## 2015-03-19 DIAGNOSIS — C080 Malignant neoplasm of submandibular gland: Secondary | ICD-10-CM

## 2015-04-07 ENCOUNTER — Ambulatory Visit
Admission: RE | Admit: 2015-04-07 | Discharge: 2015-04-07 | Disposition: A | Discharge status: Home / Self Care | Payer: 59 | Source: Ambulatory Visit | Attending: Otolaryngology | Admitting: Otolaryngology

## 2015-04-07 DIAGNOSIS — C080 Malignant neoplasm of submandibular gland: Secondary | ICD-10-CM

## 2015-04-07 MED ORDER — GADOBENATE DIMEGLUMINE 529 MG/ML IV SOLN
19.0000 mL | Freq: Once | INTRAVENOUS | Status: AC | PRN
  Administered 2015-04-07: 19 mL via INTRAVENOUS

## 2015-09-16 ENCOUNTER — Other Ambulatory Visit: Payer: Self-pay | Admitting: Otolaryngology

## 2015-09-16 DIAGNOSIS — C080 Malignant neoplasm of submandibular gland: Secondary | ICD-10-CM

## 2015-09-25 ENCOUNTER — Ambulatory Visit
Admission: RE | Admit: 2015-09-25 | Discharge: 2015-09-25 | Disposition: A | Discharge status: Home / Self Care | Payer: 59 | Source: Ambulatory Visit | Attending: Otolaryngology | Admitting: Otolaryngology

## 2015-09-25 DIAGNOSIS — C080 Malignant neoplasm of submandibular gland: Secondary | ICD-10-CM

## 2015-09-25 MED ORDER — GADOBENATE DIMEGLUMINE 529 MG/ML IV SOLN
19.0000 mL | Freq: Once | INTRAVENOUS | Status: AC | PRN
  Administered 2015-09-25: 19 mL via INTRAVENOUS

## 2016-05-08 ENCOUNTER — Emergency Department (HOSPITAL_COMMUNITY)
Admission: EM | Admit: 2016-05-08 | Discharge: 2016-05-08 | Disposition: A | Discharge status: Home / Self Care | Payer: 59 | Attending: Emergency Medicine | Admitting: Emergency Medicine

## 2016-05-08 ENCOUNTER — Encounter (HOSPITAL_COMMUNITY): Payer: Self-pay | Admitting: *Deleted

## 2016-05-08 ENCOUNTER — Emergency Department (HOSPITAL_COMMUNITY): Payer: 59

## 2016-05-08 DIAGNOSIS — M545 Low back pain: Secondary | ICD-10-CM | POA: Diagnosis present

## 2016-05-08 DIAGNOSIS — M5441 Lumbago with sciatica, right side: Secondary | ICD-10-CM | POA: Insufficient documentation

## 2016-05-08 DIAGNOSIS — Z79899 Other long term (current) drug therapy: Secondary | ICD-10-CM | POA: Insufficient documentation

## 2016-05-08 DIAGNOSIS — M5431 Sciatica, right side: Secondary | ICD-10-CM

## 2016-05-08 MED ORDER — OXYCODONE-ACETAMINOPHEN 5-325 MG PO TABS
1.0000 | ORAL_TABLET | Freq: Once | ORAL | Status: AC
  Administered 2016-05-08: 1 via ORAL
  Filled 2016-05-08: qty 1

## 2016-05-08 MED ORDER — OXYCODONE-ACETAMINOPHEN 5-325 MG PO TABS: 1.0000 | ORAL_TABLET | ORAL | 0 refills | Status: DC | PRN

## 2016-05-08 MED ORDER — CYCLOBENZAPRINE HCL 10 MG PO TABS: 10.0000 mg | ORAL_TABLET | Freq: Three times a day (TID) | ORAL | 0 refills | Status: DC | PRN

## 2016-05-08 MED ORDER — PREDNISONE 10 MG PO TABS: ORAL_TABLET | ORAL | 0 refills | Status: DC

## 2016-05-08 MED ORDER — IBUPROFEN 800 MG PO TABS
800.0000 mg | ORAL_TABLET | Freq: Once | ORAL | Status: AC
  Administered 2016-05-08: 800 mg via ORAL
  Filled 2016-05-08: qty 1

## 2016-05-08 NOTE — Discharge Instructions (Signed)
Alternate ice and heat to your back.  Follow-up with your doctor if the pain is not improving.

## 2016-05-08 NOTE — ED Triage Notes (Signed)
Pt is here for pain in right hip pain that his shooting through buttock and down right leg.  Pain is sharp.  Pt had some right sided pain like this for a month but last Wednesday pt sneezed and the pain became severe after that.

## 2016-05-08 NOTE — ED Notes (Signed)
Aleve this morning and flexeril at home without much relief.  Right sided lower back pain that radiates down right leg

## 2016-05-11 ENCOUNTER — Encounter: Payer: Self-pay | Admitting: Family Medicine

## 2016-05-11 ENCOUNTER — Ambulatory Visit (INDEPENDENT_AMBULATORY_CARE_PROVIDER_SITE_OTHER): Payer: 59 | Admitting: Family Medicine

## 2016-05-11 VITALS — BP 131/81 | HR 83 | Temp 98.9°F | Ht 73.0 in | Wt 202.2 lb

## 2016-05-11 DIAGNOSIS — M5441 Lumbago with sciatica, right side: Secondary | ICD-10-CM | POA: Diagnosis not present

## 2016-05-11 MED ORDER — METHYLPREDNISOLONE ACETATE 80 MG/ML IJ SUSP
80.0000 mg | Freq: Once | INTRAMUSCULAR | Status: AC
  Administered 2016-05-11: 80 mg via INTRAMUSCULAR

## 2016-05-11 MED ORDER — OXYCODONE-ACETAMINOPHEN 5-325 MG PO TABS: 1.0000 | ORAL_TABLET | Freq: Three times a day (TID) | ORAL | 0 refills | Status: DC | PRN

## 2016-05-11 MED ORDER — NAPROXEN 500 MG PO TABS: 500.0000 mg | ORAL_TABLET | Freq: Two times a day (BID) | ORAL | 0 refills | Status: DC

## 2016-05-11 NOTE — Patient Instructions (Addendum)
Great to meet you!  Lets see you again in 1 week if you are not improving  We will send your labs on mychart or call within 1 week  Take naproxen twice daily with food for 10 days. Use Flexeril as needed. Use Percocet as needed.  We will work on arranging an MRI.

## 2016-05-11 NOTE — Progress Notes (Signed)
   HPI  Patient presents today here with sciatica for ER follow-up.  Patient explains that he's had some back pain for home however last Thursday he got down to pick something up and could not get back up. He had excruciating pain at that time. He was seen in emergency room and treated with Percocet, prednisone, and Flexeril. He's also taken some Advil.  States pain has improved slightly, however he still has severe pain. 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  He has been using Percocet occasionally, however he is using it very sparingly.  Review of RN ER notes shows that he had onset of symptoms after sneezing.  PMH: Smoking status noted ROS: Per HPI  Objective: BP 131/81   Pulse 83   Temp 98.9 F (37.2 C) (Oral)   Ht '6\' 1"'$  (1.854 m)   Wt 202 lb 3.2 oz (91.7 kg)   BMI 26.68 kg/m  Gen: NAD, alert, cooperative with exam HEENT: NCAT CV: RRR, good S1/S2, no murmur Resp: CTABL, no wheezes, non-labored Ext: No edema, warm Neuro: Alert and oriented, Positive straight leg raise, strength 5/5 and sensation intact in bilateral lower extremities, 2+ patellar tendon reflexes bilaterally  MSK Mild tenderness to palpation of right-sided lumbar spine paraspinal muscles, and slightly midline.   Assessment and plan:  # Low back pain, sciatica with right-sided sciatica. 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       Orders Placed This Encounter  Procedures  . MR Lumbar Spine Wo Contrast    Standing Status:   Future    Standing Expiration Date:   07/09/2017    Order Specific Question:   Reason for Exam (SYMPTOM  OR DIAGNOSIS REQUIRED)    Answer:   R sided sciatica    Order Specific Question:   Preferred  imaging location?    Answer:   [facility] (table limit-350lbs)    Order Specific Question:   What is the patient's sedation requirement?    Answer:   No Sedation    Order Specific Question:   Does the patient have a pacemaker or implanted devices?    Answer:   No    Meds ordered this encounter  Medications  . naproxen (NAPROSYN) 500 MG tablet    Sig: Take 1 tablet (500 mg total) by mouth 2 (two) times daily with a meal.    Dispense:  20 tablet    Refill:  0  . oxyCODONE-acetaminophen (ROXICET) 5-325 MG tablet    Sig: Take 1 tablet by mouth every 8 (eight) hours as needed for severe pain.    Dispense:  20 tablet    Refill:  0  . methylPREDNISolone acetate (DEPO-MEDROL) injection 80 mg    Laroy Apple, MD Tristan Schroeder [facility] 05/11/2016, 5:25 PM

## 2016-05-12 NOTE — ED Provider Notes (Signed)
[facility] DEPT Provider Note   CSN: [id number] Arrival date & time: 05/08/16  1044     History   Chief Complaint Chief Complaint  Patient presents with  . Back Pain    HPI [name] is a 41 y.o. male.  HPI  [name] is a 41 y.o. male who presents to the Emergency Department complaining of low back pain and right hip pain.  Pain has been persistent for one month and worse for several days.  He describes a sharp pain that radiates into his right leg and worsens with movement and bending.  Improves with certain positions.  He denies fever, chills, abd pain, numbness or weakness of the extremities, urine or bowel changes.     Past Medical History:  Diagnosis Date  . 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 History of stress test    workup done for syncope   . 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 Syncopal episodes     Patient Active Problem List   Diagnosis Date Noted  . 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 Syncope 02/21/2014  . Chest pain 02/21/2014    Past Surgical History:  Procedure Laterality Date  . NECK SURGERY    . none    . SUBMANDIBULAR GLAND EXCISION Right 63/05/6008   DR Erik Obey  . SUBMANDIBULAR GLAND EXCISION Right 07/12/2014   Procedure: EXCISION RIGHT SUBMANDIBULAR GLAND;  Surgeon: [name], MD;  Location: [facility];  Service: ENT;  Laterality: Right;  . SUBMANDIBULAR GLAND EXCISION Right 08/12/2014   Procedure: Selected neck dissection;  Surgeon: [name], MD;  Location: [facility];  Service: ENT;  Laterality: Right;       Home Medications    Prior to Admission medications   Medication Sig Start Date End Date Taking? Authorizing Provider  cyclobenzaprine (FLEXERIL) 10 MG tablet Take 1 tablet (10 mg total) by mouth 3 (three) times daily as needed. 05/08/16   Creola Krotz, PA-C  fexofenadine (ALLEGRA) 180 MG tablet Take 180 mg by mouth daily as needed for  allergies. OTC    Historical Provider, MD  loratadine (CLARITIN) 10 MG tablet Take 10 mg by mouth daily.    Historical Provider, MD  naproxen (NAPROSYN) 500 MG tablet Take 1 tablet (500 mg total) by mouth 2 (two) times daily with a meal. 05/11/16   Timmothy Euler, MD  oxyCODONE-acetaminophen (ROXICET) 5-325 MG tablet Take 1 tablet by mouth every 8 (eight) hours as needed for severe pain. 05/11/16   Timmothy Euler, MD  predniSONE (DELTASONE) 10 MG tablet Take 6 tablets day one, 5 tablets day two, 4 tablets day three, 3 tablets day four, 2 tablets day five, then 1 tablet day six 05/08/16   Kem Parkinson, PA-C    Family History Family History  Problem Relation Age of Onset  . Adopted: Yes  . Family history unknown: Yes    Social History Social History  Substance Use Topics  . Smoking status: Former Smoker    Packs/day: 0.50    Years: 9.00    Quit date: 02/22/2011  . Smokeless tobacco: Former Systems developer    Types: Snuff  . Alcohol use 0.0 oz/week     Comment: occas. beer  STOPPED ON 3/4     Allergies   Hydrocodone   Review of Systems Review of Systems  Constitutional: Negative for fever.  Respiratory: Negative for shortness of breath.   Gastrointestinal: Negative for abdominal pain,  constipation and vomiting.  Genitourinary: Negative for decreased urine volume, difficulty urinating, dysuria, flank pain and hematuria.  Musculoskeletal: Positive for back pain. Negative for joint swelling.  Skin: Negative for rash.  Neurological: Negative for weakness and numbness.  All other systems reviewed and are negative.    Physical Exam Updated Vital Signs BP 116/75 (BP Location: Right Arm)   Pulse 80   Temp 97.7 F (36.5 C) (Oral)   Resp 18   Wt 90.7 kg   SpO2 100%   BMI 26.39 kg/m   Physical Exam  Constitutional: He is oriented to person, place, and time. He appears well-developed and well-nourished. No distress.  HENT:  Head: Normocephalic and atraumatic.  Neck: Normal  range of motion. Neck supple.  Cardiovascular: Normal rate, regular rhythm and intact distal pulses.   No murmur heard. Pulmonary/Chest: Effort normal and breath sounds normal. No respiratory distress.  Abdominal: Soft. He exhibits no distension. There is no tenderness.  Musculoskeletal: He exhibits tenderness. He exhibits no edema.       Lumbar back: He exhibits tenderness and pain. He exhibits normal range of motion, no swelling, no deformity, no laceration and normal pulse.  ttp of the lower lumbar spine and right lower paraspinal muscles  And SI joint. DP pulses are brisk and symmetrical.  Distal sensation intact.  Pt has 5/5 strength against resistance of bilateral lower extremities.     Neurological: He is alert and oriented to person, place, and time. He has normal strength. No sensory deficit. He exhibits normal muscle tone. Coordination and gait normal.  Skin: Skin is warm and dry. No rash noted.  Nursing note and vitals reviewed.    ED Treatments / Results  Labs (all labs ordered are listed, but only abnormal results are displayed) Labs Reviewed - No data to display  EKG  EKG Interpretation None       Radiology Dg Lumbar Spine Complete  Result Date: 05/08/2016 CLINICAL DATA:  Pt is here for pain in right hip pain that his shooting through buttock and down right leg. Pain is sharp. Pt had some right sided pain like this for a month but last Wednesday pt sneezed and the pain became severe after that. EXAM: LUMBAR SPINE - COMPLETE 4+ VIEW COMPARISON:  07/29/2014 FINDINGS: Normal alignment of lumbar vertebral bodies. No loss of vertebral body height or disc height. No pars fracture. No subluxation. Calcified mesenteric lymph nodes noted in the LEFT quadrant. IMPRESSION: No acute osseous abnormality. Electronically Signed   By: Suzy Bouchard M.D.   On: 05/08/2016 12:24     Procedures Procedures (including critical care time)  Medications Ordered in ED Medications    ibuprofen (ADVIL,MOTRIN) tablet 800 mg (800 mg Oral Given 05/08/16 1205)  oxyCODONE-acetaminophen (PERCOCET/ROXICET) 5-325 MG per tablet 1 tablet (1 tablet Oral Given 05/08/16 1205)     Initial Impression / Assessment and Plan / ED Course  I have reviewed the triage vital signs and the nursing notes.  Pertinent labs & imaging results that were available during my care of the patient were reviewed by me and considered in my medical decision making (see chart for details).  Clinical Course     Pt with likely sciatica.  Ambulates with steady gait.  No focal neuro deficits. Doubt emergent neurological process.  Pt agrees to PMD f/u.  Appears stable for d/c  Final Clinical Impressions(s) / ED Diagnoses   Final diagnoses:  Sciatica of right side    New Prescriptions Discharge Medication List as  of 05/08/2016 12:52 PM    START taking these medications   Details  cyclobenzaprine (FLEXERIL) 10 MG tablet Take 1 tablet (10 mg total) by mouth 3 (three) times daily as needed., Starting Sat 05/08/2016, Print    predniSONE (DELTASONE) 10 MG tablet Take 6 tablets day one, 5 tablets day two, 4 tablets day three, 3 tablets day four, 2 tablets day five, then 1 tablet day six, Print    oxyCODONE-acetaminophen (PERCOCET/ROXICET) 5-325 MG tablet Take 1 tablet by mouth every 4 (four) hours as needed., Starting Sat 05/08/2016, [place], PA-C 05/12/16 [place], MD 05/13/16 [phone]

## 2016-05-24 ENCOUNTER — Encounter: Payer: Self-pay | Admitting: Family Medicine

## 2016-06-03 ENCOUNTER — Ambulatory Visit (HOSPITAL_COMMUNITY)
Admission: RE | Admit: 2016-06-03 | Discharge: 2016-06-03 | Disposition: A | Discharge status: Home / Self Care | Payer: 59 | Source: Ambulatory Visit | Attending: Family Medicine | Admitting: Family Medicine

## 2016-06-03 DIAGNOSIS — M5117 Intervertebral disc disorders with radiculopathy, lumbosacral region: Secondary | ICD-10-CM | POA: Insufficient documentation

## 2016-06-03 DIAGNOSIS — M5441 Lumbago with sciatica, right side: Secondary | ICD-10-CM | POA: Diagnosis present

## 2016-06-04 ENCOUNTER — Other Ambulatory Visit: Payer: Self-pay | Admitting: Family Medicine

## 2016-06-04 DIAGNOSIS — M5126 Other intervertebral disc displacement, lumbar region: Secondary | ICD-10-CM | POA: Insufficient documentation

## 2016-06-11 ENCOUNTER — Encounter: Payer: Self-pay | Admitting: Family

## 2016-06-11 ENCOUNTER — Ambulatory Visit (INDEPENDENT_AMBULATORY_CARE_PROVIDER_SITE_OTHER): Payer: 59 | Admitting: Family

## 2016-06-11 VITALS — BP 128/82 | HR 92 | Temp 97.6°F | Ht 73.0 in | Wt 202.0 lb

## 2016-06-11 DIAGNOSIS — L723 Sebaceous cyst: Secondary | ICD-10-CM

## 2016-06-11 DIAGNOSIS — M5441 Lumbago with sciatica, right side: Secondary | ICD-10-CM | POA: Diagnosis not present

## 2016-06-11 DIAGNOSIS — IMO0002 Reserved for concepts with insufficient information to code with codable children: Secondary | ICD-10-CM

## 2016-06-11 MED ORDER — METHYLPREDNISOLONE ACETATE 80 MG/ML IJ SUSP
80.0000 mg | Freq: Once | INTRAMUSCULAR | Status: AC
Start: 2016-06-11 — End: 2016-06-11
  Administered 2016-06-11: 80 mg via INTRAMUSCULAR

## 2016-06-11 MED ORDER — CYCLOBENZAPRINE HCL 10 MG PO TABS: 10.0000 mg | ORAL_TABLET | Freq: Three times a day (TID) | ORAL | 0 refills | Status: DC | PRN

## 2016-06-11 MED ORDER — KETOROLAC TROMETHAMINE 60 MG/2ML IM SOLN
60.0000 mg | Freq: Once | INTRAMUSCULAR | Status: AC
  Administered 2016-06-11: 60 mg via INTRAMUSCULAR

## 2016-06-11 MED ORDER — NAPROXEN 500 MG PO TABS
500.0000 mg | ORAL_TABLET | Freq: Two times a day (BID) | ORAL | 0 refills | Status: DC
Start: 2016-06-11 — End: 2016-07-16

## 2016-06-11 NOTE — Addendum Note (Signed)
Addended by: Evelina Dun A on: 06/11/2016 09:53 AM   Modules accepted: Orders

## 2016-06-11 NOTE — Progress Notes (Signed)
   Subjective:    Patient ID: [name], male    DOB: 1975/06/11, 41 y.o.   MRN: [id number]  Pt presents to the office today with a "lump" in his neck. 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 PT states he noticed a small "lump" on the scar a few months ago, but noticed it has become "bigger" and tender. Pt requesting to have it removed. States "I'm just nervous with my history".  Back Pain  This is a recurrent problem. The current episode started 1 to 4 weeks ago. The problem occurs constantly. The problem has been waxing and waning since onset. The pain is present in the gluteal. The quality of the pain is described as aching. The pain is moderate. The symptoms are aggravated by sitting and standing. Associated symptoms include leg pain and numbness. He has tried ice, NSAIDs, home exercises and bed rest for the symptoms. The treatment provided mild relief.      Review of Systems  Musculoskeletal: Positive for back pain.  Neurological: Positive for numbness.  All other systems reviewed and are negative.      Objective:   Physical Exam  Constitutional: He is oriented to person, place, and time. He appears well-developed and well-nourished. No distress.  HENT:  Head: Normocephalic.  Right Ear: External ear normal.  Left Ear: External ear normal.  Nose: Nose normal.  Mouth/Throat: Oropharynx is clear and moist.  Eyes: Right eye exhibits no discharge. Left eye exhibits no discharge.  Neck: Normal range of motion. Neck supple. No thyromegaly present.  Small approx .11mX.274mcyst in right cervical, tender no erythemas or discharge  Cardiovascular: Normal rate, regular rhythm, normal heart sounds and intact distal pulses.   No murmur heard. Pulmonary/Chest: Effort normal and breath sounds normal. No respiratory distress. He has no wheezes.  Abdominal: Soft. Bowel sounds are normal. He exhibits no distension. There is no tenderness.    Musculoskeletal: Normal range of motion. He exhibits no edema or tenderness.  Pain in right lower back into right leg, pain with sitting and standing, negative SLR  Lymphadenopathy:    He has no cervical adenopathy.  Neurological: He is alert and oriented to person, place, and time.  Skin: Skin is warm and dry. No rash noted. No erythema.  Psychiatric: He has a normal mood and affect. His behavior is normal. Judgment and thought content normal.  Vitals reviewed.   BP 128/82   Pulse 92   Temp 97.6 F (36.4 C) (Oral)   Ht '6\' 1"'$  (1.854 m)   Wt 202 lb (91.6 kg)   BMI 26.65 kg/m        Assessment & Plan:  1. Cyst of neck Referral to derm pending - Ambulatory referral to Dermatology  2. Acute right-sided low back pain with right-sided sciatica -Rest -Ice and heat -Continue ROM and stretches RTO prn  - methylPREDNISolone acetate (DEPO-MEDROL) injection 80 mg; Inject 1 mL (80 mg total) into the muscle once. - ketorolac (TORADOL) injection 60 mg; Inject 2 mLs (60 mg total) into the muscle once.  ChEvelina DunFNP

## 2016-06-11 NOTE — Addendum Note (Signed)
Addended by: Evelina Dun A on: 06/11/2016 09:46 AM   Modules accepted: Orders

## 2016-06-11 NOTE — Patient Instructions (Signed)
Spondylolisthesis Rehab Ask your health care provider which exercises are safe for you. Do exercises exactly as told by your health care provider and adjust them as directed. It is normal to feel mild stretching, pulling, tightness, or discomfort as you do these exercises, but you should stop right away if you feel sudden pain or your pain gets worse. Do not begin these exercises until told by your health care provider. Stretching and range of motion exercises These exercises warm up your muscles and joints and improve the movement and flexibility of your hips and your back. These exercises may also help to relieve pain, numbness, and tingling. Exercise A: Single knee to chest 1. Lie on your back on a firm surface with both legs straight. 2. Bend one of your knees. Use your hands to move your knee up toward your chest until you feel a gentle stretch in your lower back and buttock.  Hold your leg in this position by holding onto the front of your knee.  Keep your other leg as straight as possible. 3. Hold for __________ seconds. 4. Slowly return to the starting position. 5. Repeat this exercise with your other leg. Repeat __________ times. Complete this exercise __________ times a day. Exercise B: Double knee to chest 1. Lie on your back on a firm surface with both legs straight. 2. Bend one of your knees and move it toward your chest until you feel a gentle stretch in your lower back and buttock. 3. Tense your abdominal muscles and repeat the previous step with your other leg. 4. Hold both of your legs in this position by holding onto the backs of your thighs or the fronts of your knees. 5. Hold for __________ seconds. 6. Tense your abdominal muscles and slowly move your legs back to the floor, one leg at a time. Repeat __________ times. Complete this exercise __________ times a day. Strengthening exercises These exercises build strength and endurance in your back. Endurance is the ability to  use your muscles for a long time, even after they get tired. Exercise C: Pelvic tilt 1. Lie on your back on a firm bed or the floor. Bend your knees and keep your feet flat. 2. Tense your abdominal muscles. Tip your pelvis up toward the ceiling and flatten your lower back into the floor.  To help with this exercise, you may place a small towel under your lower back and try to push your back into the towel. 3. Hold for __________ seconds. 4. Let your muscles relax completely before you repeat this exercise. Repeat __________ times. Complete this exercise __________ times a day. Exercise D: Abdominal crunch 1. Lie on your back on a firm surface. Bend your knees and keep your feet flat. Cross your arms over your chest. 2. Tuck your chin down toward your chest, without bending your neck. 3. Use your abdominal muscles to lift your upper body off of the ground, straight up into the air.  Try to lift yourself until your shoulder blades are off the ground. You may need to work up to this.  Keep your lower back on the ground while you crunch upward.  Do not hold your breath. 4. Slowly lower yourself down. Keep your abdominal muscles tense until you are back to the starting position. Repeat __________ times. Complete this exercise __________ times a day. Exercise E: Alternating arm and leg raises 1. Get on your hands and knees on a firm surface. If you are on a hard floor, you   may want to use padding to cushion your knees, such as an exercise mat. 2. Line up your arms and legs. Your hands should be below your shoulders, and your knees should be below your hips. 3. Lift your left leg behind you. At the same time, raise your right arm and straighten it in front of you.  Do not lift your leg higher than your hip.  Do not lift your arm higher than your shoulder.  Keep your abdominal and back muscles tight.  Keep your hips facing the ground.  Do not arch your back.  Keep your balance carefully,  and do not hold your breath. 4. Hold for __________ seconds. 5. Slowly return to the starting position and repeat with your right leg and your left arm. Repeat __________ times. Complete this exercise __________ times a day. Posture and body mechanics   Body mechanics refers to the movements and positions of your body while you do your daily activities. Posture is part of body mechanics. Good posture and healthy body mechanics can help to relieve stress in your body's tissues and joints. Good posture means that your spine is in its natural S-curve position (your spine is neutral), your shoulders are pulled back slightly, and your head is not tipped forward. The following are general guidelines for applying improved posture and body mechanics to your everyday activities. Standing   When standing, keep your spine neutral and your feet about hip-width apart. Keep a slight bend in your knees. Your ears, shoulders, and hips should line up.  When you do a task in which you stand in one place for a long time, place one foot up on a stable object that is 2-4 inches (5-10 cm) high, such as a footstool. This helps keep your spine neutral. Sitting  When sitting, keep your spine neutral and keep your feet flat on the floor. Use a footrest, if necessary, and keep your thighs parallel to the floor. Avoid rounding your shoulders, and avoid tilting your head forward.  When working at a desk or a computer, keep your desk at a height where your hands are slightly lower than your elbows. Slide your chair under your desk so you are close enough to maintain good posture.  When working at a computer, place your monitor at a height where you are looking straight ahead and you do not have to tilt your head forward or downward to look at the screen. Resting   When lying down and resting, avoid positions that are most painful for you.  If you have pain with activities such as sitting, bending, stooping, or squatting  (flexion-based activities), lie in a position in which your body does not bend very much. For example, avoid curling up on your side with your arms and knees near your chest (fetal position).  If you have pain with activities such as standing for a long time or reaching with your arms (extension-based activities), lie with your spine in a neutral position and bend your knees slightly. Try the following positions:  Lying on your side with a pillow between your knees.  Lying on your back with a pillow under your knees. Lifting   When lifting objects, keep your feet at least shoulder-width apart and tighten your abdominal muscles.  Bend your knees and hips and keep your spine neutral. It is important to lift using the strength of your legs, not your back. Do not lock your knees straight out.  Always ask for help to lift   heavy or awkward objects. This information is not intended to replace advice given to you by your health care provider. Make sure you discuss any questions you have with your health care provider. Document Released: 04/26/2005 Document Revised: 01/01/2016 Document Reviewed: 02/04/2015 Elsevier Interactive Patient Education  2017 [employer]  

## 2016-07-16 ENCOUNTER — Other Ambulatory Visit: Payer: Self-pay | Admitting: Family

## 2016-07-22 ENCOUNTER — Other Ambulatory Visit: Payer: Self-pay | Admitting: Otolaryngology

## 2016-07-26 ENCOUNTER — Other Ambulatory Visit: Payer: Self-pay | Admitting: Otolaryngology

## 2016-07-26 DIAGNOSIS — C080 Malignant neoplasm of submandibular gland: Secondary | ICD-10-CM

## 2016-07-26 DIAGNOSIS — C760 Malignant neoplasm of head, face and neck: Secondary | ICD-10-CM

## 2016-08-06 ENCOUNTER — Ambulatory Visit
Admission: RE | Admit: 2016-08-06 | Discharge: 2016-08-06 | Disposition: A | Discharge status: Home / Self Care | Payer: 59 | Source: Ambulatory Visit | Attending: Otolaryngology | Admitting: Otolaryngology

## 2016-08-06 DIAGNOSIS — C760 Malignant neoplasm of head, face and neck: Secondary | ICD-10-CM

## 2016-08-06 DIAGNOSIS — C080 Malignant neoplasm of submandibular gland: Secondary | ICD-10-CM

## 2016-08-06 MED ORDER — GADOBENATE DIMEGLUMINE 529 MG/ML IV SOLN
20.0000 mL | Freq: Once | INTRAVENOUS | Status: AC | PRN
  Administered 2016-08-06: 20 mL via INTRAVENOUS

## 2016-08-06 MED ORDER — IOPAMIDOL (ISOVUE-300) INJECTION 61%
150.0000 mL | Freq: Once | INTRAVENOUS | Status: AC | PRN
  Administered 2016-08-06: 150 mL via INTRAVENOUS

## 2016-08-25 ENCOUNTER — Encounter: Payer: Self-pay | Admitting: *Deleted

## 2016-08-25 ENCOUNTER — Ambulatory Visit (HOSPITAL_BASED_OUTPATIENT_CLINIC_OR_DEPARTMENT_OTHER): Payer: 59 | Admitting: Hematology and Oncology

## 2016-08-25 ENCOUNTER — Encounter: Payer: Self-pay | Admitting: Hematology and Oncology

## 2016-08-25 DIAGNOSIS — C080 Malignant neoplasm of submandibular gland: Secondary | ICD-10-CM

## 2016-08-25 DIAGNOSIS — C7800 Secondary malignant neoplasm of unspecified lung: Secondary | ICD-10-CM

## 2016-08-25 NOTE — Progress Notes (Signed)
Oncology Nurse Navigator Documentation  Met with [name] during initial consult with Dr. Alvy Bimler.   He is seeing Dr. Alvy Bimler to discuss chemotherapy/systemic treatment options. He expressed wish for referrals to MD [facility]. I provided my contact information, encouraged him to call me as needed.  Gayleen Orem, RN, BSN, [facility] Nurse [place] [facility] [place] [phone]

## 2016-08-26 ENCOUNTER — Telehealth: Payer: Self-pay | Admitting: *Deleted

## 2016-08-26 DIAGNOSIS — C7800 Secondary malignant neoplasm of unspecified lung: Secondary | ICD-10-CM | POA: Insufficient documentation

## 2016-08-26 NOTE — Assessment & Plan Note (Addendum)
The patient is not symptomatic from lung metastasis.  Recommend close observation with surveillance imaging study of his chest for now, pending second opinion elsewhere

## 2016-08-26 NOTE — Telephone Encounter (Signed)
Referral called to [facility]:  [facility]: (p) [phone] ([phone] Pt MRN: [id number]  Records faxed.

## 2016-08-26 NOTE — Progress Notes (Signed)
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 He underwent surgery the first time 2 years ago and declined adjuvant radiation treatment He was found to have recurrence of right submandibular mass 2 months ago and underwent further evaluation and repeat surgery I have reviewed his records extensively and summarized as follows:   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[place])   02/19/2014 Imaging    Ct neck: 1. No acute intracranial abnormality. 2. Swollen right submandibular gland containing prominent calcifications consistent with submandibular gland stones. Submandibular gland enlargement is most likely inflammatory. Malignancy cannot be entirely excluded .      07/12/2014 Pathology Results    Submandibular gland, Right - ADENOID CYSTIC CARCINOMA, GRADE I OF III, SEE COMMENT. - POSITIVE FOR PERINEURAL INVASION. - TUMOR INVOLVES SURGICAL EXCISIONAL MARGIN - SEE TUMOR SYNOPTIC TEMPLATE BELOW. Microscopic Comment ONCOLOGY TABLE - SALIVARY GLAND 1. Specimen: Right submandibular gland 2. Procedure: Excision. 3. Tumor site and laterality: Right submandibular gland 4. Tumor focality: Unifocal 5. Maximum tumor size (cm): 3.8 cm 6. Histologic type: Adenoid cystic carcinoma. 7. Grade: I of III 8. Margins: Positive Distance of tumor from closest margin: Present at  margin 9. Perineural invasion: Present 10. Lymph-Vascular invasion: Absent. 11. Lymph nodes: # examined - 0; # positive - N/A; extracapsular extension - N/A 12. TNM code: pT2, pNX, pMX      07/12/2014 Surgery    Right submandibular gland excision       07/24/2014 Miscellaneous    The patient was seen by radiation oncologist locally who offered him adjuvant radiation treatment.  The patient obtained second opinion at [place] who recommended against adjuvant radiation treatment.  The patient finally decided not proceed with radiation therapy      07/29/2014 Imaging    No findings specific for metastatic disease in the chest or abdomen. 4 mm subpleural nodule in the medial left lower lobe, likely reflecting a benign subpleural lymph node, although technically indeterminate. Attention on follow-up is suggested.       07/30/2014 Imaging    MR face: Postop resection of right submandibular gland for adenoid cystic carcinoma. No evidence of perineural spread. No skullbase or cavernous sinus involvement.       08/12/2014 Surgery    Right level I, 2 neck selective dissection       08/12/2014 Pathology Results    Lymph nodes, radical neck dissection, limited right neck - BENIGN SALIVARY GLAND TISSUE; NEGATIVE FOR ATYPIA OR MALIGNANCY. - BENIGN SKIN WITH DERMAL AND SUBCUTANEOUS SCAR AND INFLAMMATORY TISSUE REACTION. - TWO INTRA-SALIVARY GLAND LYMPH NODES, NEGATIVE FOR TUMOR (0/2) - ONE LEVEL ONE-A LYMPH NODE, NEGATIVE FOR TUMOR (0/1). - SIX LEVEL TWO LYMPH NODES, NEGATIVE FOR TUMOR (0/6)      04/07/2015 Imaging    MR neck: 1. Status post resection of right submandibular gland. 2. No evidence for perineural spread or tumor recurrence. 3. Stable central and bilateral foraminal narrowing at  C6-7.      09/25/2015 Imaging    MR neck: Stable exam. No evidence of residual or recurrent right submandibular carcinoma.      07/08/2016 Procedure    He underwent repeat excision  of right submandibular mass: A 2 x 3 cm ellipse was made around mass incorporating the scar. This was carried down through skin and subcutaneous fat and the dissection was completed at the level of the sternomastoid muscle. Examination of the specimen revealed complete removal. This was sent for pathologic interpretation.           07/09/2016 Pathology Results    Adenoid cystic carcinoma (margins free of neoplasm)      08/06/2016 Imaging    CT scan of chest, abdomen and pelvis: 1. There are now multiple pulmonary nodules consistent with metastatic disease to the lungs. One nodule was present previously, measuring 4 mm, noted along the pleural margin of the left lower lobe. This is most likely a benign granuloma. The remaining visualized nodules are new consistent with metastatic disease, given the patient's history. 2. No other findings of metastatic disease within the chest. 3. No evidence of metastatic disease in the abdomen or pelvis. 4. No acute findings.      08/06/2016 Imaging    Ct neck: 1. Multiple new bilateral apical pulmonary nodules measuring up to 1.3 cm and concerning for metastatic disease. 2. 22 x 7 mm right level III lymph node, enlarged from 2016 and indeterminate. 3. Prior right submandibular gland resection without evidence of locally recurrent mass.      08/06/2016 Imaging    MR face: Prior right submandibular gland resection without evidence of recurrent tumor      He has recovered well from recent surgery. He complained of mild chronic back pain, stable He denies recent swallowing difficulties, chewing on food or recent weight loss Denies chest pain or shortness of breath  MEDICAL HISTORY:  Past Medical History:  Diagnosis Date  . Cancer ([place])   . Carcinoma ([place]) 07/2014   adenoid cystic  . History of stress test    workup done for syncope   . 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 Syncopal episodes     SURGICAL HISTORY: Past Surgical History:  Procedure Laterality  Date  . NECK SURGERY    . none    . SUBMANDIBULAR GLAND EXCISION Right [id number]/70/6237   DR Erik Obey  . SUBMANDIBULAR GLAND EXCISION Right 07/12/2014   Procedure: EXCISION RIGHT SUBMANDIBULAR GLAND;  Surgeon: [name], MD;  Location: [facility];  Service: ENT;  Laterality: Right;  . SUBMANDIBULAR GLAND EXCISION Right 08/12/2014   Procedure: Selected neck dissection;  Surgeon: [name], MD;  Location: [facility];  Service: ENT;  Laterality: Right;    SOCIAL HISTORY: Social History   Social History  . Marital status: Legally Separated    Spouse name: N/A  . Number of children: 1  . Years of education: N/A   Occupational History  . Not on file.   Social History Main Topics  . Smoking status: Former Smoker    Packs/day: 0.50    Years: 9.00    Quit date: 02/22/2011  . Smokeless tobacco: Former Systems developer    Types: Snuff  . Alcohol use 0.0 oz/week     Comment: occas. beer  STOPPED ON 3/4  . Drug use: Yes    Types: Marijuana     Comment: once weekly---STOPPED IT 3/4  . Sexual activity: Not on file   Other Topics Concern  . Not  on file   Social History Narrative   Separated from his wife. Lives with his son.  Education: high school and [employer].  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   Lives with [name]    FAMILY HISTORY: Family History  Problem Relation Age of Onset  . Adopted: Yes  . Family history unknown: Yes    ALLERGIES:  is allergic to hydrocodone.  MEDICATIONS:  Current Outpatient Prescriptions  Medication Sig Dispense Refill  . loratadine (CLARITIN) 10 MG tablet Take 10 mg by mouth daily.    . naproxen (NAPROSYN) 500 MG tablet TAKE ONE TABLET BY MOUTH TWICE DAILY WITH MEALS 60 tablet 1  . cyclobenzaprine (FLEXERIL) 10 MG tablet TAKE ONE TABLET BY MOUTH THREE TIMES DAILY AS NEEDED (Patient not taking: Reported on 08/25/2016) 60 tablet 1  . fexofenadine (ALLEGRA) 180 MG tablet Take 180 mg by mouth daily as needed for  allergies. OTC     No current facility-administered medications for this visit.     REVIEW OF SYSTEMS:   Constitutional: Denies fevers, chills or abnormal night sweats Eyes: Denies blurriness of vision, double vision or watery eyes Ears, nose, mouth, throat, and face: Denies mucositis or sore throat Respiratory: Denies cough, dyspnea or wheezes Cardiovascular: Denies palpitation, chest discomfort or lower extremity swelling Gastrointestinal:  Denies nausea, heartburn or change in bowel habits Skin: Denies abnormal skin rashes Lymphatics: Denies new lymphadenopathy or easy bruising Neurological:Denies numbness, tingling or new weaknesses Behavioral/Psych: Mood is stable, no new changes  All other systems were reviewed with the patient and are negative.  PHYSICAL EXAMINATION: ECOG PERFORMANCE STATUS: 0 - Asymptomatic  Vitals:   08/25/16 1451  BP: 124/86  Pulse: 73  Resp: 18  Temp: 98.9 F (37.2 C)   Filed Weights   08/25/16 1451  Weight: 204 lb 4.8 oz (92.7 kg)    GENERAL:alert, no distress and comfortable SKIN: skin color, texture, turgor are normal, no rashes or significant lesions EYES: normal, conjunctiva are pink and non-injected, sclera clear OROPHARYNX:no exudate, no erythema and lips, buccal mucosa, and tongue normal  NECK: Well-healed surgical scar LYMPH:  no palpable lymphadenopathy in the cervical, axillary or inguinal LUNGS: clear to auscultation and percussion with normal breathing effort HEART: regular rate & rhythm and no murmurs and no lower extremity edema ABDOMEN:abdomen soft, non-tender and normal bowel sounds Musculoskeletal:no cyanosis of digits and no clubbing  PSYCH: alert & oriented x 3 with fluent speech NEURO: no focal motor/sensory deficits  LABORATORY DATA:  I have reviewed the data as listed Lab Results  Component Value Date   WBC 7.6 08/12/2014   HGB 15.6 08/12/2014   HCT 45.8 08/12/2014   MCV 86.3 08/12/2014   PLT 184 08/12/2014    Lab Results  Component Value Date   NA 141 08/12/2014   K 3.8 08/12/2014   CL 109 08/12/2014   CO2 26 08/12/2014    RADIOGRAPHIC STUDIES: I have personally reviewed the radiological images as listed and agreed with the findings in the report. Ct Soft Tissue Neck W Contrast  Result Date: 08/06/2016 CLINICAL DATA:  History of right submandibular gland adenoid cystic carcinoma status post resection. EXAM: CT NECK WITH CONTRAST TECHNIQUE: Multidetector CT imaging of the neck was performed using the standard protocol following the bolus administration of intravenous contrast. CONTRAST:  172m ISOVUE-300 IOPAMIDOL (ISOVUE-300) INJECTION 61% COMPARISON:  Facial MRI earlier today. Neck and chest CT 07/29/2014. FINDINGS: Pharynx and larynx: No evidence of pharyngeal or laryngeal mass  or parapharyngeal inflammatory change. Asymmetry of the piriform sinuses is unchanged. Salivary glands: Prior right submandibular gland resection without evidence of locally recurrent mass. The left submandibular gland and both parotid glands are unremarkable. Thyroid: Unchanged small focus of fat in the right thyroid lobe. Lymph nodes: There is a 22 x 7 mm high right level III lymph node which is larger than on the prior neck CT (series 2, image 60, previously 14 x 5 mm). Adjacent smaller right level II B/ III lymph nodes measure up to 5 mm in short axis and are stable to minimally larger the. The 22 x 6 mm right level II lymph node described on the prior CT has decreased in size. Small left level II/ III lymph nodes measuring up to 7 mm in short axis are similar to the prior CT. Vascular: Major vascular structures of the neck appear patent. Limited intracranial: Unremarkable. Visualized orbits: Unremarkable. Mastoids and visualized paranasal sinuses: Clear. Skeleton: Moderate disc space narrowing and degenerative endplate spurring at H0-8 with bilateral neural foraminal stenosis. No suspicious osseous lesion. Upper chest: 2  small right upper lobe lung nodules are new from the prior chest CT and measure 4 mm (series 3, image 32) and 5 mm (series 3, image 43). There are four larger nodules in the left upper lobe which are also new, with the largest measuring 13 x 10 mm (series 3, image 34). Other: None. IMPRESSION: 1. Multiple new bilateral apical pulmonary nodules measuring up to 1.3 cm and concerning for metastatic disease. 2. 22 x 7 mm right level III lymph node, enlarged from 2016 and indeterminate. 3. Prior right submandibular gland resection without evidence of locally recurrent mass. Electronically Signed   By: Logan Bores M.D.   On: 08/06/2016 15:17   Ct Chest W Contrast  Result Date: 08/06/2016 CLINICAL DATA:  F/u adenoid cystic ca of right submandibular gland x 2 yearsr/o mets /reoccurrence Cyst removed from right side of neck No injury EXAM: CT CHEST, ABDOMEN, AND PELVIS WITH CONTRAST TECHNIQUE: Multidetector CT imaging of the chest, abdomen and pelvis was performed following the standard protocol during bolus administration of intravenous contrast. CONTRAST:  133m ISOVUE-300 IOPAMIDOL (ISOVUE-300) INJECTION 61% COMPARISON:  07/29/2014. FINDINGS: CT CHEST FINDINGS Cardiovascular: No significant vascular findings. Normal heart size. No pericardial effusion. Mediastinum/Nodes: No enlarged mediastinal, hilar, or axillary lymph nodes. Thyroid gland, trachea, and esophagus demonstrate no significant findings. Lungs/Pleura: Multiple lung nodules. In the right lower lobe, abutting the medial pleural margin posterior to the inferior vena cava just below the left atrium, there is a 17 x 11 mm nodule. This is centered on image 106, series 6. Abutting the left heart border, there is another nodule measuring 16 mm in greatest dimension. There are less well-defined smaller nodules: 10 mm nodule in the left upper lobe, image 28, 10 mm nodule, left upper lobe image 29, 9 mm nodule, left upper lobe, image 38, 11 mm nodule left upper  lobe, image 41. Additional well-defined nodules are also noted, largest measuring 9 mm in the left lower lobe, image 103. Several additional nodules are noted in the right lung. There is a stable 4 mm pleural-based nodule in the left lower lobe, image 106. No evidence of pneumonia or pulmonary edema. No pleural effusion or pneumothorax. Musculoskeletal: No acute findings. No osteoblastic or osteolytic lesions. CT ABDOMEN PELVIS FINDINGS Hepatobiliary: No focal liver abnormality is seen. No gallstones, gallbladder wall thickening, or biliary dilatation. Pancreas: Unremarkable. No pancreatic ductal dilatation or surrounding inflammatory changes. Spleen:  Normal in size without focal abnormality. Adrenals/Urinary Tract: No adrenal masses. 6 mm low-density left upper pole renal cortical mass, similar to the prior CT, likely a cyst. No other renal abnormalities. No stones. No hydronephrosis. Normal ureters. Unremarkable bladder. Stomach/Bowel: Stomach is within normal limits. Appendix appears normal. No evidence of bowel wall thickening, distention, or inflammatory changes. Vascular/Lymphatic: No significant vascular findings are present. No enlarged abdominal or pelvic lymph nodes. Reproductive: Unremarkable Other: No abdominal wall hernia or abnormality. No abdominopelvic ascites. Musculoskeletal: No acute finding. No osteoblastic or osteolytic lesions. IMPRESSION: 1. There are now multiple pulmonary nodules consistent with metastatic disease to the lungs. One nodule was present previously, measuring 4 mm, noted along the pleural margin of the left lower lobe. This is most likely a benign granuloma. The remaining visualized nodules are new consistent with metastatic disease, given the patient's history. 2. No other findings of metastatic disease within the chest. 3. No evidence of metastatic disease in the abdomen or pelvis. 4. No acute findings. Electronically Signed   By: Lajean Manes M.D.   On: 08/06/2016 15:19    Ct Abdomen Pelvis W Contrast  Result Date: 08/06/2016 CLINICAL DATA:  F/u adenoid cystic ca of right submandibular gland x 2 yearsr/o mets /reoccurrence Cyst removed from right side of neck No injury EXAM: CT CHEST, ABDOMEN, AND PELVIS WITH CONTRAST TECHNIQUE: Multidetector CT imaging of the chest, abdomen and pelvis was performed following the standard protocol during bolus administration of intravenous contrast. CONTRAST:  184m ISOVUE-300 IOPAMIDOL (ISOVUE-300) INJECTION 61% COMPARISON:  07/29/2014. FINDINGS: CT CHEST FINDINGS Cardiovascular: No significant vascular findings. Normal heart size. No pericardial effusion. Mediastinum/Nodes: No enlarged mediastinal, hilar, or axillary lymph nodes. Thyroid gland, trachea, and esophagus demonstrate no significant findings. Lungs/Pleura: Multiple lung nodules. In the right lower lobe, abutting the medial pleural margin posterior to the inferior vena cava just below the left atrium, there is a 17 x 11 mm nodule. This is centered on image 106, series 6. Abutting the left heart border, there is another nodule measuring 16 mm in greatest dimension. There are less well-defined smaller nodules: 10 mm nodule in the left upper lobe, image 28, 10 mm nodule, left upper lobe image 29, 9 mm nodule, left upper lobe, image 38, 11 mm nodule left upper lobe, image 41. Additional well-defined nodules are also noted, largest measuring 9 mm in the left lower lobe, image 103. Several additional nodules are noted in the right lung. There is a stable 4 mm pleural-based nodule in the left lower lobe, image 106. No evidence of pneumonia or pulmonary edema. No pleural effusion or pneumothorax. Musculoskeletal: No acute findings. No osteoblastic or osteolytic lesions. CT ABDOMEN PELVIS FINDINGS Hepatobiliary: No focal liver abnormality is seen. No gallstones, gallbladder wall thickening, or biliary dilatation. Pancreas: Unremarkable. No pancreatic ductal dilatation or surrounding  inflammatory changes. Spleen: Normal in size without focal abnormality. Adrenals/Urinary Tract: No adrenal masses. 6 mm low-density left upper pole renal cortical mass, similar to the prior CT, likely a cyst. No other renal abnormalities. No stones. No hydronephrosis. Normal ureters. Unremarkable bladder. Stomach/Bowel: Stomach is within normal limits. Appendix appears normal. No evidence of bowel wall thickening, distention, or inflammatory changes. Vascular/Lymphatic: No significant vascular findings are present. No enlarged abdominal or pelvic lymph nodes. Reproductive: Unremarkable Other: No abdominal wall hernia or abnormality. No abdominopelvic ascites. Musculoskeletal: No acute finding. No osteoblastic or osteolytic lesions. IMPRESSION: 1. There are now multiple pulmonary nodules consistent with metastatic disease to the lungs. One nodule was present  previously, measuring 4 mm, noted along the pleural margin of the left lower lobe. This is most likely a benign granuloma. The remaining visualized nodules are new consistent with metastatic disease, given the patient's history. 2. No other findings of metastatic disease within the chest. 3. No evidence of metastatic disease in the abdomen or pelvis. 4. No acute findings. Electronically Signed   By: Lajean Manes M.D.   On: 08/06/2016 15:19   Mr Face/trigeminal Wo/w Cm  Result Date: 08/06/2016 CLINICAL DATA:  Prior resection of right submandibular gland adenoid cystic carcinoma. EXAM: MRI FACE TRIGEMINAL WITHOUT AND WITH CONTRAST TECHNIQUE: Multiplanar, multiecho pulse sequences of the face and surrounding structures, including thin slice imaging of the course of the Trigeminal Nerves, were obtained both before and after administration of intravenous contrast. CONTRAST:  57m MULTIHANCE GADOBENATE DIMEGLUMINE 529 MG/ML IV SOLN COMPARISON:  04/07/2015 FINDINGS: Sequelae of right submandibular gland resection are again identified. There is no evidence of  locally recurrent mass or perineural tumor. The left submandibular gland and both parotid glands are unremarkable. No enlarged lymph nodes are identified in the visualized upper neck. The orbits are unremarkable. There is minimal mucosal thickening in the ethmoid sinuses bilaterally. The mastoid air cells are clear. The visualized portion of the brain is unremarkable. IMPRESSION: Prior right submandibular gland resection without evidence of recurrent tumor. Electronically Signed   By: ALogan BoresM.D.   On: 08/06/2016 14:34    ASSESSMENT & PLAN:  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 is a patient who originally presented with right submandibular gland swelling 2 years ago, underwent surgery.  After surgery, due to positive margin, he underwent second surgery, along with selective lymph node dissection on the right neck with no further residual disease  He was initially offered adjuvant radiation therapy but after obtaining second opinion, declined adjuvant radiation treatment. Recently, the patient was found to have recurrence of disease locally, resected with negative margins.  Staging imaging studies showed evidence of distant metastatic cancer. The patient is not symptomatic from metastatic cancer standpoint. We discussed general approach to metastatic disease for adenoid cystic carcinoma. Due to this rare disease entity, I do not have extensive experience treating this disease.  Most patients have variable course of disease progression. Some patients can live for many years, even with metastatic cancer. I recommend second opinion [place] [facility] [place] [facility] [place] Hopefully, there may be some clinical trials available for the patient. If not, I think it is reasonable to send his tissue sample for [facility] testing to see if there are any molecular targeted therapies available for his disease  rather than giving him conventional systemic chemotherapy.  Metastasis to lung ([facility] The patient is not symptomatic from lung metastasis.  Recommend close observation with surveillance imaging study of his chest for now, pending second opinion elsewhere    All questions were answered. The patient knows to call the clinic with any problems, questions or concerns. I spent 60 minutes counseling the patient face to face. The total time spent in the appointment was 80 minutes and more than 50% was on counseling.     NHeath Lark MD 08/26/16 7:28 AM

## 2016-08-26 NOTE — Assessment & Plan Note (Signed)
This is a patient who originally presented with right submandibular gland swelling 2 years ago, underwent surgery.  After surgery, due to positive margin, he underwent second surgery, along with selective lymph node dissection on the right neck with no further residual disease  He was initially offered adjuvant radiation therapy but after obtaining second opinion, declined adjuvant radiation treatment. Recently, the patient was found to have recurrence of disease locally, resected with negative margins.  Staging imaging studies showed evidence of distant metastatic cancer. The patient is not symptomatic from metastatic cancer standpoint. We discussed general approach to metastatic disease for adenoid cystic carcinoma. Due to this rare disease entity, I do not have extensive experience treating this disease.  Most patients have variable course of disease progression. Some patients can live for many years, even with metastatic cancer. I recommend second opinion at tertiary center such as [facility] [address] [place] The patient is interested to proceed Hopefully, there may be some clinical trials available for the patient. If not, I think it is reasonable to send his tissue sample for [employer] One testing to see if there are any molecular targeted therapies available for his disease rather than giving him conventional systemic chemotherapy.

## 2016-09-07 ENCOUNTER — Telehealth: Payer: Self-pay

## 2016-09-07 NOTE — Telephone Encounter (Signed)
[name] with [facility] called requesting most recent OV note from Dr Alvy Bimler. She had left 3 messages with HIM. Faxed notes.

## 2016-09-17 ENCOUNTER — Ambulatory Visit (INDEPENDENT_AMBULATORY_CARE_PROVIDER_SITE_OTHER): Payer: 59 | Admitting: Family Medicine

## 2016-09-17 ENCOUNTER — Encounter: Payer: Self-pay | Admitting: Family Medicine

## 2016-09-17 ENCOUNTER — Other Ambulatory Visit: Payer: Self-pay | Admitting: Family

## 2016-09-17 VITALS — BP 126/89 | HR 69 | Temp 98.5°F | Ht 73.0 in | Wt 197.0 lb

## 2016-09-17 DIAGNOSIS — F329 Major depressive disorder, single episode, unspecified: Secondary | ICD-10-CM | POA: Diagnosis not present

## 2016-09-17 DIAGNOSIS — M5416 Radiculopathy, lumbar region: Secondary | ICD-10-CM

## 2016-09-17 DIAGNOSIS — F419 Anxiety disorder, unspecified: Secondary | ICD-10-CM | POA: Diagnosis not present

## 2016-09-17 DIAGNOSIS — M5441 Lumbago with sciatica, right side: Secondary | ICD-10-CM

## 2016-09-17 DIAGNOSIS — F32A Depression, unspecified: Secondary | ICD-10-CM

## 2016-09-17 MED ORDER — DULOXETINE HCL 30 MG PO CPEP: 30.0000 mg | ORAL_CAPSULE | Freq: Every day | ORAL | 1 refills | Status: DC

## 2016-09-17 MED ORDER — METHYLPREDNISOLONE ACETATE 80 MG/ML IJ SUSP
80.0000 mg | Freq: Once | INTRAMUSCULAR | Status: AC
  Administered 2016-09-17: 80 mg via INTRAMUSCULAR

## 2016-09-17 MED ORDER — PREDNISONE 20 MG PO TABS: ORAL_TABLET | ORAL | 0 refills | Status: DC

## 2016-09-17 NOTE — Progress Notes (Signed)
BP 126/89   Pulse 69   Temp 98.5 F (36.9 C) (Oral)   Ht '6\' 1"'$  (1.854 m)   Wt 197 lb (89.4 kg)   BMI 25.99 kg/m    Subjective:    Patient ID: [name], male    DOB: 1976-01-04, 41 y.o.   MRN: [id number]  HPI: [name] is a 41 y.o. male presenting on 09/17/2016 for Back Pain with sciatica (ongoing since end of December, pain radiating into right leg, has had MRI which shows disc herniation & nerve impingement)   HPI Low back pain with sciatica Patient has known low back pain with sciatic nerve pain traveling down his right leg all the way to his foot. He had an MRI in January which showed that he had nerve root impingement at the S1 level. He had a steroid injection that and it did improve but over the past week and has returned and his pain is worsened drastically.  Anxiety and depression Patient is coming in today with complaints of anxiety and feelings of depression and sadness and feeling very anxious especially with the cancer that he is dealing with and especially now that he is being sent to [place] because the oncologists here stating that they no longer can treat what he has. He just has a lot going on in his life at this time and he just feels very stressed out. He doesn't want something for anxiety that can make him feel drunk like Xanax but he wants something that can more make an level on a daily basis and help him through all of this that he is doing. 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  Relevant past medical, surgical, family and social history reviewed and updated as indicated. Interim medical history since our last visit reviewed. Allergies and medications reviewed and updated.  Review of Systems  Constitutional: Negative for chills and fever.  Respiratory: Negative for shortness of breath and wheezing.   Cardiovascular: Negative for chest pain and  leg swelling.  Musculoskeletal: Positive for arthralgias and back pain. Negative for gait problem.  Skin: Negative for rash.  Neurological: Positive for numbness. Negative for dizziness, weakness and headaches.  Psychiatric/Behavioral: Positive for dysphoric mood. Negative for decreased concentration, self-injury, sleep disturbance and suicidal ideas. The patient is nervous/anxious.   All other systems reviewed and are negative.   Per HPI unless specifically indicated above        Objective:    BP 126/89   Pulse 69   Temp 98.5 F (36.9 C) (Oral)   Ht '6\' 1"'$  (1.854 m)   Wt 197 lb (89.4 kg)   BMI 25.99 kg/m   Wt Readings from Last 3 Encounters:  09/17/16 197 lb (89.4 kg)  08/25/16 204 lb 4.8 oz (92.7 kg)  06/11/16 202 lb (91.6 kg)    Physical Exam  Constitutional: He is oriented to person, place, and time. He appears well-developed and well-nourished. No distress.  Eyes: Conjunctivae are normal. No scleral icterus.  Musculoskeletal: Normal range of motion. He exhibits tenderness. He exhibits no edema.       Lumbar back: He exhibits tenderness (Right lower back pain with positive straight leg raise on the right).  Neurological: He is alert and oriented to person, place, and time. Coordination normal.  Skin: Skin is warm and dry. No rash noted. He is not diaphoretic.  Psychiatric: He has a normal mood and affect. His behavior is normal.  Nursing note and vitals reviewed.       Assessment & Plan:   Problem List Items Addressed This Visit    None    Visit Diagnoses    Acute right-sided low back pain with right-sided sciatica    -  Primary   Relevant Medications   methylPREDNISolone acetate (DEPO-MEDROL) injection 80 mg   predniSONE (DELTASONE) 20 MG tablet   DULoxetine (CYMBALTA) 30 MG capsule   Anxiety and depression       Relevant Medications   DULoxetine (CYMBALTA) 30 MG capsule   Lumbar nerve root impingement       Relevant Medications   DULoxetine (CYMBALTA) 30  MG capsule   Other Relevant Orders   Ambulatory referral to Orthopedic Surgery       Follow up plan: Return in about 4 weeks (around 10/15/2016), or if symptoms worsen or fail to improve, for Recheck on Cymbalta.  Counseling provided for all of the vaccine components Orders Placed This Encounter  Procedures  . Ambulatory referral to Cornelia Ciji Boston, MD [facility] 09/17/2016, 1:40 PM

## 2016-09-22 ENCOUNTER — Telehealth: Payer: Self-pay | Admitting: *Deleted

## 2016-09-22 NOTE — Telephone Encounter (Signed)
LM to call regarding [facility]

## 2016-09-22 NOTE — Telephone Encounter (Signed)
Has appts for June 4th-11th.

## 2016-09-22 NOTE — Telephone Encounter (Signed)
-----   Message from Heath Lark, MD sent at 09/22/2016  8:01 AM EDT ----- Regarding: follow-up on consult Has he been to [facility]? Can you call?

## 2016-10-06 ENCOUNTER — Encounter: Payer: Self-pay | Admitting: Family Medicine

## 2016-10-06 ENCOUNTER — Ambulatory Visit (INDEPENDENT_AMBULATORY_CARE_PROVIDER_SITE_OTHER): Payer: 59 | Admitting: Family Medicine

## 2016-10-06 VITALS — BP 129/84 | HR 67 | Temp 97.4°F | Ht 72.0 in | Wt 199.8 lb

## 2016-10-06 DIAGNOSIS — M5416 Radiculopathy, lumbar region: Secondary | ICD-10-CM

## 2016-10-06 DIAGNOSIS — M5441 Lumbago with sciatica, right side: Secondary | ICD-10-CM | POA: Diagnosis not present

## 2016-10-06 DIAGNOSIS — C080 Malignant neoplasm of submandibular gland: Secondary | ICD-10-CM

## 2016-10-06 DIAGNOSIS — F32A Depression, unspecified: Secondary | ICD-10-CM

## 2016-10-06 DIAGNOSIS — F329 Major depressive disorder, single episode, unspecified: Secondary | ICD-10-CM | POA: Diagnosis not present

## 2016-10-06 DIAGNOSIS — F419 Anxiety disorder, unspecified: Secondary | ICD-10-CM | POA: Diagnosis not present

## 2016-10-06 DIAGNOSIS — M5417 Radiculopathy, lumbosacral region: Secondary | ICD-10-CM | POA: Diagnosis not present

## 2016-10-06 MED ORDER — DULOXETINE HCL 60 MG PO CPEP: 60.0000 mg | ORAL_CAPSULE | Freq: Every day | ORAL | 1 refills | Status: DC

## 2016-10-06 MED ORDER — OXYCODONE-ACETAMINOPHEN 10-325 MG PO TABS: 1.0000 | ORAL_TABLET | ORAL | 0 refills | Status: DC | PRN

## 2016-10-06 MED ORDER — METHYLPREDNISOLONE ACETATE 80 MG/ML IJ SUSP
80.0000 mg | Freq: Once | INTRAMUSCULAR | Status: AC
  Administered 2016-10-06: 80 mg via INTRAMUSCULAR

## 2016-10-06 MED ORDER — DIAZEPAM 5 MG PO TABS: 5.0000 mg | ORAL_TABLET | Freq: Every evening | ORAL | 0 refills | Status: DC | PRN

## 2016-10-06 NOTE — Progress Notes (Signed)
BP 129/84   Pulse 67   Temp 97.4 F (36.3 C) (Oral)   Ht 6' (1.829 m)   Wt 199 lb 12.8 oz (90.6 kg)   BMI 27.10 kg/m    Subjective:    Patient ID: [name], male    DOB: [birth year], 41 y.o.   MRN: [id number]  HPI: [name] is a 41 y.o. male presenting on 10/06/2016 for hospital  ED follow up and Back Pain   HPI Low back pain with radiation Patient is coming back for a follow-up for low back pain with radicular pain going down his right leg down towards his foot that he has been gargling with for the past couple weeks. The emergency department gave him another round of prednisone similar to what we had done and gave him some narcotics and some Valium which did help significantly. He denies any fevers or chills or weakness but just has a lot of shooting pains starting from his right buttock region down his right leg. He rates the pain as 7 out of 10.  Anxiety and depression Patient has been having a lot of anxiety and depression and feelings of sadness or with his cancer diagnosis and that has been worsening and now spreading to other areas. The oncologist here in our region and basically told him there is nothing that they have for him and sent him to a specialty oncology research trial in [place] that he is flying out to next week. He denies any suicidal ideations. He is currently on Cymbalta 30 but does not feel like it is making a difference, we will increase it to see if they can help.  Relevant past medical, surgical, family and social history reviewed and updated as indicated. Interim medical history since our last visit reviewed. Allergies and medications reviewed and updated.  Review of Systems  Constitutional: Negative for chills and fever.  Eyes: Negative for discharge.  Respiratory: Negative for shortness of breath and wheezing.   Cardiovascular: Negative for chest pain and leg swelling.  Musculoskeletal: Positive for back pain. Negative for gait problem.  Skin:  Negative for rash.  Psychiatric/Behavioral: Positive for decreased concentration, dysphoric mood and sleep disturbance. Negative for self-injury and suicidal ideas. The patient is nervous/anxious.   All other systems reviewed and are negative.   Per HPI unless specifically indicated above     Objective:    BP 129/84   Pulse 67   Temp 97.4 F (36.3 C) (Oral)   Ht 6' (1.829 m)   Wt 199 lb 12.8 oz (90.6 kg)   BMI 27.10 kg/m   Wt Readings from Last 3 Encounters:  10/06/16 199 lb 12.8 oz (90.6 kg)  09/17/16 197 lb (89.4 kg)  08/25/16 204 lb 4.8 oz (92.7 kg)    Physical Exam  Constitutional: He is oriented to person, place, and time. He appears well-developed and well-nourished. No distress.  Eyes: Conjunctivae are normal. No scleral icterus.  Cardiovascular: Normal rate, regular rhythm, normal heart sounds and intact distal pulses.   No murmur heard. Pulmonary/Chest: Effort normal and breath sounds normal. No respiratory distress. He has no wheezes. He has no rales.  Musculoskeletal: Normal range of motion. He exhibits no edema.       Lumbar back: He exhibits tenderness.       Back:  Neurological: He is alert and oriented to person, place, and time. Coordination normal.  Skin: Skin is warm and dry. No rash noted. He is not diaphoretic.  Psychiatric:  His behavior is normal. His mood appears anxious. He exhibits a depressed mood. He expresses no suicidal ideation.  Nursing note and vitals reviewed.       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)   Relevant Medications   DULoxetine (CYMBALTA) 60 MG capsule   oxyCODONE-acetaminophen (PERCOCET) 10-325 MG tablet   diazepam (VALIUM) 5 MG tablet   methylPREDNISolone acetate (DEPO-MEDROL) injection 80 mg (Completed)    Other Visit Diagnoses    Lumbar back pain with radiculopathy affecting left lower extremity    -  Primary   Relevant  Medications   DULoxetine (CYMBALTA) 60 MG capsule   oxyCODONE-acetaminophen (PERCOCET) 10-325 MG tablet   diazepam (VALIUM) 5 MG tablet   methylPREDNISolone acetate (DEPO-MEDROL) injection 80 mg (Completed)   Acute right-sided low back pain with right-sided sciatica       Relevant Medications   DULoxetine (CYMBALTA) 60 MG capsule   oxyCODONE-acetaminophen (PERCOCET) 10-325 MG tablet   diazepam (VALIUM) 5 MG tablet   methylPREDNISolone acetate (DEPO-MEDROL) injection 80 mg (Completed)   Anxiety and depression       Relevant Medications   DULoxetine (CYMBALTA) 60 MG capsule       Follow up plan: Return in about 4 weeks (around 11/03/2016), or if symptoms worsen or fail to improve, for Recheck anxiety and depression and back pain.  Counseling provided for all of the vaccine components No orders of the defined types were placed in this encounter.   Caryl Pina, MD [facility] 10/06/2016, 4:28 PM

## 2016-10-27 ENCOUNTER — Telehealth: Payer: Self-pay

## 2016-10-27 NOTE — Telephone Encounter (Signed)
He needs to call his insurance company about this because this is a pretty standard medication.

## 2016-11-05 ENCOUNTER — Ambulatory Visit (INDEPENDENT_AMBULATORY_CARE_PROVIDER_SITE_OTHER): Payer: 59 | Admitting: Specialist

## 2016-11-05 ENCOUNTER — Encounter (INDEPENDENT_AMBULATORY_CARE_PROVIDER_SITE_OTHER): Payer: Self-pay | Admitting: Specialist

## 2016-11-05 VITALS — BP 129/82 | HR 86 | Ht 71.0 in | Wt 200.0 lb

## 2016-11-05 DIAGNOSIS — C080 Malignant neoplasm of submandibular gland: Secondary | ICD-10-CM

## 2016-11-05 DIAGNOSIS — K115 Sialolithiasis: Secondary | ICD-10-CM | POA: Diagnosis not present

## 2016-11-05 DIAGNOSIS — M5416 Radiculopathy, lumbar region: Secondary | ICD-10-CM

## 2016-11-05 DIAGNOSIS — M5441 Lumbago with sciatica, right side: Secondary | ICD-10-CM | POA: Diagnosis not present

## 2016-11-05 DIAGNOSIS — M5417 Radiculopathy, lumbosacral region: Secondary | ICD-10-CM

## 2016-11-05 DIAGNOSIS — M5116 Intervertebral disc disorders with radiculopathy, lumbar region: Secondary | ICD-10-CM

## 2016-11-05 MED ORDER — OXYCODONE-ACETAMINOPHEN 10-325 MG PO TABS: 1.0000 | ORAL_TABLET | ORAL | 0 refills | Status: DC | PRN

## 2016-11-05 NOTE — Patient Instructions (Addendum)
Avoid frequent bending and stooping  No lifting greater than 10 lbs. May use ice or moist heat for pain. Weight loss is of benefit. Avoid bending, stooping and avoid lifting weights greater than 10 lbs. Avoid prolong standing and walking. Order for a new walker with wheels. Surgery scheduling secretary Kandice Hams, will call you in the next week to schedule for surgery.  Surgery recommended is a right microdiscectomy L5-S1. Take oxycodone for for pain. Risk of surgery includes risk of infection 1 in 200 patients, bleeding 1/2% chance you would need a transfusion.   Risk to the nerves is one in 10,000. Expect improved walking and standing tolerance. Expect relief of leg pain but numbness may persist depending on the length and degree of pressure that has been present.

## 2016-11-05 NOTE — Progress Notes (Signed)
Office Visit Note   Patient: [name]           Date of Birth: June 18, 1975           MRN: [id number] Visit Date: 11/05/2016              Requested by: Dettinger, Fransisca Kaufmann, MD [place], [place] [address] PCP: Dettinger, Fransisca Kaufmann, MD   Assessment & Plan: Visit Diagnoses:  1. Acute midline low back pain with right-sided sciatica   2. Lumbar disc herniation with radiculopathy   3. Submandibular sialolithiasis   4. 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[place])     Plan: Avoid frequent bending and stooping  No lifting greater than 10 lbs. May use ice or moist heat for pain. Weight loss is of benefit. Avoid bending, stooping and avoid lifting weights greater than 10 lbs. Avoid prolong standing and walking. Order for a new walker with wheels. Surgery scheduling secretary Kandice Hams, will call you in the next week to schedule for surgery.  Surgery recommended is a right microdiscectomy L5-S1. Take oxycodone for for pain. Risk of surgery includes risk of infection 1 in 200 patients, bleeding 1/2% chance you would need a transfusion.   Risk to the nerves is one in 10,000. Expect improved walking and standing tolerance. Expect relief of leg pain but numbness may persist depending on the length and degree of pressure that has been present.  Follow-Up Instructions: No Follow-up on file.   Orders:  No orders of the defined types were placed in this encounter.  No orders of the defined types were placed in this encounter.     Procedures: No procedures performed   Clinical Data: No additional findings.   Subjective: Chief Complaint  Patient presents with  . Lower Back - Pain, Injury    Pain onset 05/05/2016, drove to [place] and woke up next day and could not get out of bed due to pain.    41 year old male with history of first time back pain with radiation into the right leg started 05/05/2016. Pain begain on that date and he was basically  immobilized by the pain, he was in [facility] left on the 05/04/2016. He took alleve,ibuprofen and returned to [facility]. Where he was given cortisone injects in the buttocks and then he was given some narcotics. He has been trying to get over the pain with You Tube exercises. 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 He would refer no operative management. Weight is staying stable. He is on a waiting list to be seen at [facility].    Review of Systems  Constitutional: Negative for activity change, appetite change, fatigue, fever and unexpected weight change.  HENT: Positive for tinnitus, trouble swallowing and voice change. Negative for sore throat.   Eyes: Negative.  Negative for photophobia, pain, redness and itching.  Respiratory: Positive for chest tightness and wheezing. Negative for shortness of breath.   Cardiovascular: Positive for leg swelling. Negative for chest pain.  Gastrointestinal: Positive for abdominal pain and nausea. Negative for anal bleeding, blood in stool, constipation and vomiting.  Endocrine: Negative.   Genitourinary: Positive for difficulty urinating.  Musculoskeletal: Positive for back pain, gait problem and joint swelling.  Skin: Negative.  Negative for pallor, rash and wound.  Allergic/Immunologic: Negative.   Neurological: Positive for weakness and numbness.  Hematological: Negative.  Negative for adenopathy. Does not bruise/bleed easily.  Psychiatric/Behavioral: Negative.  Negative for  agitation, confusion, decreased concentration, dysphoric mood, hallucinations, self-injury, sleep disturbance and suicidal ideas. The patient is not nervous/anxious and is not hyperactive.      Objective: Vital Signs: BP 129/82 (BP Location: Left Arm, Patient Position: Sitting)   Pulse 86   Ht 5\' 11"  (1.803 m)   Wt 200 lb (90.7 kg)   BMI 27.89 kg/m   Physical Exam  Constitutional: He is oriented to person, place, and time. He appears well-developed and  well-nourished.  HENT:  Head: Normocephalic and atraumatic.  Eyes: EOM are normal. Pupils are equal, round, and reactive to light.  Neck: Normal range of motion. Neck supple.  Pulmonary/Chest: Effort normal and breath sounds normal.  Abdominal: Soft. Bowel sounds are normal.  Musculoskeletal: Normal range of motion.  Neurological: He is alert and oriented to person, place, and time.  Skin: Skin is warm and dry.  Psychiatric: He has a normal mood and affect. His behavior is normal. Judgment and thought content normal.    Back Exam   Tenderness  The patient is experiencing tenderness in the lumbar.  Range of Motion  Extension: normal  Flexion: normal  Lateral Bend Right: normal  Lateral Bend Left: normal  Rotation Right: normal  Rotation Left: normal       Specialty Comments:  No specialty comments available.  Imaging: No results found.   PMFS History: Patient Active Problem List   Diagnosis Date Noted  . Metastasis to lung ([place]) 08/26/2016  . 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[place]) 07/24/2014  . 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 Syncope 02/21/2014  . Chest pain 02/21/2014   Past Medical History:  Diagnosis Date  . Cancer ([place])   . Carcinoma ([place]) 07/2014   adenoid cystic  . History of stress test    workup done for syncope   . 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 Syncopal episodes     Family History  Problem Relation Age of Onset  . Adopted: Yes  . Family history unknown: Yes    Past Surgical History:  Procedure Laterality Date  . NECK SURGERY    . none    . SUBMANDIBULAR GLAND EXCISION Right 32/99/2426   DR Erik Obey  . SUBMANDIBULAR GLAND EXCISION Right 07/12/2014   Procedure: EXCISION RIGHT SUBMANDIBULAR GLAND;  Surgeon: [name], MD;  Location: [facility];  Service: ENT;  Laterality: Right;  . SUBMANDIBULAR GLAND EXCISION Right 08/12/2014   Procedure: Selected neck  dissection;  Surgeon: [name], MD;  Location: [facility];  Service: ENT;  Laterality: Right;   Social History   Occupational History  . Not on file.   Social History Main Topics  . Smoking status: Former Smoker    Packs/day: 0.50    Years: 9.00    Quit date: 02/22/2011  . Smokeless tobacco: Former Systems developer    Types: Snuff  . Alcohol use 0.0 oz/week     Comment: occas. beer  STOPPED ON 3/4  . Drug use: Yes    Types: Marijuana     Comment: once weekly---STOPPED IT 3/4  . Sexual activity: Not on file

## 2016-11-08 ENCOUNTER — Telehealth (INDEPENDENT_AMBULATORY_CARE_PROVIDER_SITE_OTHER): Payer: Self-pay | Admitting: Radiology

## 2016-11-08 NOTE — Telephone Encounter (Signed)
Patient is calling wanting to know how long he will be down from surgery. He wants to know how long it will be before he can return to work? And If it is possible for him to back on light duty as soon as possible?---Please advise and I will call.

## 2016-11-11 ENCOUNTER — Encounter (HOSPITAL_COMMUNITY)
Admission: RE | Admit: 2016-11-11 | Discharge: 2016-11-11 | Disposition: A | Discharge status: Home / Self Care | Payer: 59 | Source: Ambulatory Visit | Attending: Specialist | Admitting: Specialist

## 2016-11-11 ENCOUNTER — Other Ambulatory Visit (INDEPENDENT_AMBULATORY_CARE_PROVIDER_SITE_OTHER): Payer: Self-pay | Admitting: Specialist

## 2016-11-11 ENCOUNTER — Encounter (HOSPITAL_COMMUNITY): Payer: Self-pay

## 2016-11-11 DIAGNOSIS — K219 Gastro-esophageal reflux disease without esophagitis: Secondary | ICD-10-CM | POA: Diagnosis not present

## 2016-11-11 DIAGNOSIS — Z87891 Personal history of nicotine dependence: Secondary | ICD-10-CM | POA: Diagnosis not present

## 2016-11-11 DIAGNOSIS — M1990 Unspecified osteoarthritis, unspecified site: Secondary | ICD-10-CM | POA: Diagnosis not present

## 2016-11-11 DIAGNOSIS — M5117 Intervertebral disc disorders with radiculopathy, lumbosacral region: Secondary | ICD-10-CM | POA: Diagnosis not present

## 2016-11-11 DIAGNOSIS — M549 Dorsalgia, unspecified: Secondary | ICD-10-CM | POA: Diagnosis present

## 2016-11-11 DIAGNOSIS — Z79899 Other long term (current) drug therapy: Secondary | ICD-10-CM | POA: Diagnosis not present

## 2016-11-11 HISTORY — DX: Tinnitus, bilateral: H93.13

## 2016-11-11 HISTORY — DX: Major depressive disorder, single episode, unspecified: F32.9

## 2016-11-11 HISTORY — DX: Personal history of urinary calculi: Z87.442

## 2016-11-11 HISTORY — DX: Gastro-esophageal reflux disease without esophagitis: K21.9

## 2016-11-11 HISTORY — DX: Unspecified osteoarthritis, unspecified site: M19.90

## 2016-11-11 HISTORY — DX: Depression, unspecified: F32.A

## 2016-11-11 LAB — COMPREHENSIVE METABOLIC PANEL
ALT: 24 U/L (ref 17–63)
AST: 30 U/L (ref 15–41)
Albumin: 4.1 g/dL (ref 3.5–5.0)
Alkaline Phosphatase: 58 U/L (ref 38–126)
Anion gap: 8 (ref 5–15)
BUN: 22 mg/dL — AB (ref 6–20)
CHLORIDE: 107 mmol/L (ref 101–111)
CO2: 25 mmol/L (ref 22–32)
CREATININE: 1.01 mg/dL (ref 0.61–1.24)
Calcium: 9.2 mg/dL (ref 8.9–10.3)
GFR calc Af Amer: >60 mL/min (ref >60)
GFR calc non Af Amer: >60 mL/min (ref >60)
Glucose, Bld: 86 mg/dL (ref 65–99)
Potassium: 3.7 mmol/L (ref 3.5–5.1)
SODIUM: 140 mmol/L (ref 135–145)
Total Bilirubin: 0.6 mg/dL (ref 0.3–1.2)
Total Protein: 6.4 g/dL — ABNORMAL LOW (ref 6.5–8.1)

## 2016-11-11 LAB — CBC
HCT: 38.4 % — ABNORMAL LOW (ref 39.0–52.0)
Hemoglobin: 12.6 g/dL — ABNORMAL LOW (ref 13.0–17.0)
MCH: 28.4 pg (ref 26.0–34.0)
MCHC: 32.8 g/dL (ref 30.0–36.0)
MCV: 86.7 fL (ref 78.0–100.0)
PLATELETS: 196 10*3/uL (ref 150–400)
RBC: 4.43 MIL/uL (ref 4.22–5.81)
RDW: 13.2 % (ref 11.5–15.5)
WBC: 7.9 10*3/uL (ref 4.0–10.5)

## 2016-11-11 LAB — APTT: APTT: 26 s (ref 24–36)

## 2016-11-11 LAB — SURGICAL PCR SCREEN
MRSA, PCR: NEGATIVE
Staphylococcus aureus: NEGATIVE

## 2016-11-11 LAB — URINALYSIS, ROUTINE W REFLEX MICROSCOPIC
Bilirubin Urine: NEGATIVE
GLUCOSE, UA: NEGATIVE mg/dL
Hgb urine dipstick: NEGATIVE
Ketones, ur: NEGATIVE mg/dL
LEUKOCYTES UA: NEGATIVE
NITRITE: NEGATIVE
PH: 7 (ref 5.0–8.0)
PROTEIN: NEGATIVE mg/dL
Specific Gravity, Urine: 1.012 (ref 1.005–1.030)

## 2016-11-11 LAB — PROTIME-INR
INR: 1
Prothrombin Time: 13.2 seconds (ref 11.4–15.2)

## 2016-11-11 NOTE — Pre-Procedure Instructions (Signed)
    [name]  11/11/2016      [facility] [place], [place] - [address] [place] [address] [place]  [address] Phone: [phone] Fax: [phone]  [place], [place] - [address] [place] [address] Phone: [phone] Fax: [phone]    Your procedure is scheduled on Friday, July 6th   Report to [facility] Admitting at 10:30 AM             (posted surgery time 12:30 pm- 3:00 pm)   Call this number if you have problems the morning of surgery:  [phone], for other questions, call [phone] from 8-4, Mon - Fri   Remember:   Do not eat food or drink liquids after midnight tonight.              (Stop taking any herbal supplements, vitamins, anti-inflammatories)   Take these medicines the morning of surgery with A SIP OF WATER : Duloxetine, Oxycodone.   Do not wear jewelry - no rings or watches.  Do not wear lotions, colognes or deoderant.             Men may shave face and neck.   Do not bring valuables to the hospital.  [facility] is not responsible for any belongings or valuables.  Contacts, dentures or bridgework may not be worn into surgery.  Leave your suitcase in the car.  After surgery it may be brought to your room.  For patients admitted to the hospital, discharge time will be determined by your treatment team.  Patients discharged the day of surgery will not be allowed to drive home, and will need someone to stay with you for the first 24 hrs.  Please read over the following fact sheets that you were given. Pain Booklet, MRSA Information and Surgical Site Infection Prevention

## 2016-11-11 NOTE — Progress Notes (Signed)
PCP is Dr. Lenna Sciara Dettinger @ [facility].  LOV 09/2016 Back in 2015, he had syncopal episode, he had cardiac work up, echo.  Not cardiac in nature.  No further testing done and he has not been back since. Dr. Erik Obey did parotidectomy 2016 -- has Adenoid Cystic Carcinoma.  He has since learned his has "mets to my lungs".  Has been down to [place]

## 2016-11-12 ENCOUNTER — Encounter (HOSPITAL_COMMUNITY): Payer: Self-pay

## 2016-11-12 ENCOUNTER — Observation Stay (HOSPITAL_COMMUNITY)
Admission: RE | Admit: 2016-11-12 | Discharge: 2016-11-13 | Disposition: A | Discharge status: Home / Self Care | Payer: 59 | Source: Ambulatory Visit | Attending: Specialist | Admitting: Specialist

## 2016-11-12 ENCOUNTER — Encounter (HOSPITAL_COMMUNITY)
Admission: RE | Disposition: A | Discharge status: Home / Self Care | Payer: Self-pay | Source: Ambulatory Visit | Attending: Specialist

## 2016-11-12 ENCOUNTER — Ambulatory Visit (HOSPITAL_COMMUNITY): Payer: 59 | Admitting: Certified Registered"

## 2016-11-12 ENCOUNTER — Ambulatory Visit (HOSPITAL_COMMUNITY): Payer: 59

## 2016-11-12 DIAGNOSIS — Z87891 Personal history of nicotine dependence: Secondary | ICD-10-CM | POA: Insufficient documentation

## 2016-11-12 DIAGNOSIS — M5117 Intervertebral disc disorders with radiculopathy, lumbosacral region: Principal | ICD-10-CM | POA: Insufficient documentation

## 2016-11-12 DIAGNOSIS — Z419 Encounter for procedure for purposes other than remedying health state, unspecified: Secondary | ICD-10-CM

## 2016-11-12 DIAGNOSIS — M5416 Radiculopathy, lumbar region: Secondary | ICD-10-CM

## 2016-11-12 DIAGNOSIS — Z9889 Other specified postprocedural states: Secondary | ICD-10-CM

## 2016-11-12 DIAGNOSIS — M5126 Other intervertebral disc displacement, lumbar region: Secondary | ICD-10-CM | POA: Diagnosis not present

## 2016-11-12 DIAGNOSIS — C080 Malignant neoplasm of submandibular gland: Secondary | ICD-10-CM

## 2016-11-12 DIAGNOSIS — K219 Gastro-esophageal reflux disease without esophagitis: Secondary | ICD-10-CM | POA: Insufficient documentation

## 2016-11-12 DIAGNOSIS — M5116 Intervertebral disc disorders with radiculopathy, lumbar region: Secondary | ICD-10-CM

## 2016-11-12 DIAGNOSIS — M5441 Lumbago with sciatica, right side: Secondary | ICD-10-CM

## 2016-11-12 HISTORY — PX: LUMBAR LAMINECTOMY/DECOMPRESSION MICRODISCECTOMY: SHX5026

## 2016-11-12 SURGERY — LUMBAR LAMINECTOMY/DECOMPRESSION MICRODISCECTOMY
Anesthesia: General | Site: Back

## 2016-11-12 MED ORDER — FENTANYL CITRATE (PF) 250 MCG/5ML IJ SOLN
INTRAMUSCULAR | Status: AC
  Filled 2016-11-12: qty 5

## 2016-11-12 MED ORDER — ONDANSETRON HCL 4 MG/2ML IJ SOLN
INTRAMUSCULAR | Status: AC
  Filled 2016-11-12: qty 2

## 2016-11-12 MED ORDER — SODIUM CHLORIDE 0.9% FLUSH: 3.0000 mL | INTRAVENOUS | Status: DC | PRN

## 2016-11-12 MED ORDER — ACETAMINOPHEN 650 MG RE SUPP: 650.0000 mg | RECTAL | Status: DC | PRN

## 2016-11-12 MED ORDER — PROPOFOL 10 MG/ML IV BOLUS
INTRAVENOUS | Status: DC | PRN
  Administered 2016-11-12: 160 mg via INTRAVENOUS

## 2016-11-12 MED ORDER — MIDAZOLAM HCL 2 MG/2ML IJ SOLN
INTRAMUSCULAR | Status: AC
  Filled 2016-11-12: qty 2

## 2016-11-12 MED ORDER — ONDANSETRON HCL 4 MG PO TABS: 4.0000 mg | ORAL_TABLET | Freq: Four times a day (QID) | ORAL | Status: DC | PRN

## 2016-11-12 MED ORDER — OXYCODONE HCL 5 MG PO TABS
5.0000 mg | ORAL_TABLET | ORAL | Status: DC | PRN
  Administered 2016-11-12 (×2): 10 mg via ORAL
  Filled 2016-11-12 (×2): qty 2

## 2016-11-12 MED ORDER — PHENYLEPHRINE 40 MCG/ML (10ML) SYRINGE FOR IV PUSH (FOR BLOOD PRESSURE SUPPORT)
PREFILLED_SYRINGE | INTRAVENOUS | Status: DC | PRN
  Administered 2016-11-12: 80 ug via INTRAVENOUS

## 2016-11-12 MED ORDER — ONDANSETRON HCL 4 MG/2ML IJ SOLN: 4.0000 mg | Freq: Once | INTRAMUSCULAR | Status: DC | PRN

## 2016-11-12 MED ORDER — BUPIVACAINE LIPOSOME 1.3 % IJ SUSP
INTRAMUSCULAR | Status: DC | PRN
  Administered 2016-11-12: 20 mL

## 2016-11-12 MED ORDER — LORATADINE 10 MG PO TABS: 10.0000 mg | ORAL_TABLET | Freq: Every day | ORAL | Status: DC | PRN

## 2016-11-12 MED ORDER — ADULT MULTIVITAMIN W/MINERALS CH
1.0000 | ORAL_TABLET | Freq: Every day | ORAL | Status: DC
  Administered 2016-11-12: 1 via ORAL
  Filled 2016-11-12: qty 1

## 2016-11-12 MED ORDER — POLYETHYLENE GLYCOL 3350 17 G PO PACK: 17.0000 g | PACK | Freq: Every day | ORAL | Status: DC | PRN

## 2016-11-12 MED ORDER — MIDAZOLAM HCL 5 MG/5ML IJ SOLN
INTRAMUSCULAR | Status: DC | PRN
  Administered 2016-11-12: 2 mg via INTRAVENOUS

## 2016-11-12 MED ORDER — SUGAMMADEX SODIUM 200 MG/2ML IV SOLN
INTRAVENOUS | Status: DC | PRN
Start: 2016-11-12 — End: 2016-11-12
  Administered 2016-11-12: 200 mg via INTRAVENOUS

## 2016-11-12 MED ORDER — CEFAZOLIN SODIUM-DEXTROSE 2-4 GM/100ML-% IV SOLN
2.0000 g | Freq: Three times a day (TID) | INTRAVENOUS | Status: AC
  Administered 2016-11-12 – 2016-11-13 (×2): 2 g via INTRAVENOUS
  Filled 2016-11-12 (×2): qty 100

## 2016-11-12 MED ORDER — CHLORHEXIDINE GLUCONATE 4 % EX LIQD: 60.0000 mL | Freq: Once | CUTANEOUS | Status: DC

## 2016-11-12 MED ORDER — PHENOL 1.4 % MT LIQD: 1.0000 | OROMUCOSAL | Status: DC | PRN

## 2016-11-12 MED ORDER — LORATADINE 10 MG PO TABS: 10.0000 mg | ORAL_TABLET | Freq: Every day | ORAL | Status: DC

## 2016-11-12 MED ORDER — OXYCODONE-ACETAMINOPHEN 10-325 MG PO TABS: 1.0000 | ORAL_TABLET | Freq: Four times a day (QID) | ORAL | 0 refills | Status: DC | PRN

## 2016-11-12 MED ORDER — SODIUM CHLORIDE 0.9% FLUSH
3.0000 mL | Freq: Two times a day (BID) | INTRAVENOUS | Status: DC
  Administered 2016-11-12: 3 mL via INTRAVENOUS

## 2016-11-12 MED ORDER — ROCURONIUM BROMIDE 50 MG/5ML IV SOLN
INTRAVENOUS | Status: AC
  Filled 2016-11-12: qty 1

## 2016-11-12 MED ORDER — DULOXETINE HCL 30 MG PO CPEP
60.0000 mg | ORAL_CAPSULE | Freq: Every day | ORAL | Status: DC
  Administered 2016-11-12: 60 mg via ORAL
  Filled 2016-11-12 (×2): qty 2

## 2016-11-12 MED ORDER — ONDANSETRON HCL 4 MG/2ML IJ SOLN
INTRAMUSCULAR | Status: DC | PRN
  Administered 2016-11-12: 4 mg via INTRAVENOUS

## 2016-11-12 MED ORDER — PROPOFOL 10 MG/ML IV BOLUS
INTRAVENOUS | Status: AC
  Filled 2016-11-12: qty 20

## 2016-11-12 MED ORDER — DIAZEPAM 5 MG PO TABS: 5.0000 mg | ORAL_TABLET | Freq: Every evening | ORAL | Status: DC | PRN

## 2016-11-12 MED ORDER — MEPERIDINE HCL 25 MG/ML IJ SOLN: 6.2500 mg | INTRAMUSCULAR | Status: DC | PRN

## 2016-11-12 MED ORDER — THROMBIN 20000 UNITS EX SOLR
CUTANEOUS | Status: AC
  Filled 2016-11-12: qty 20000

## 2016-11-12 MED ORDER — HYDROMORPHONE HCL 1 MG/ML IJ SOLN: 1.0000 mg | INTRAMUSCULAR | Status: DC | PRN

## 2016-11-12 MED ORDER — SODIUM CHLORIDE 0.9 % IV SOLN: INTRAVENOUS | Status: DC

## 2016-11-12 MED ORDER — ALUM & MAG HYDROXIDE-SIMETH 200-200-20 MG/5ML PO SUSP: 30.0000 mL | Freq: Four times a day (QID) | ORAL | Status: DC | PRN

## 2016-11-12 MED ORDER — LACTATED RINGERS IV SOLN
INTRAVENOUS | Status: DC
  Administered 2016-11-12 (×2): via INTRAVENOUS

## 2016-11-12 MED ORDER — CEFAZOLIN SODIUM-DEXTROSE 2-4 GM/100ML-% IV SOLN
2.0000 g | INTRAVENOUS | Status: AC
  Administered 2016-11-12: 2 g via INTRAVENOUS

## 2016-11-12 MED ORDER — DOCUSATE SODIUM 100 MG PO CAPS
100.0000 mg | ORAL_CAPSULE | Freq: Two times a day (BID) | ORAL | Status: DC
  Administered 2016-11-12: 100 mg via ORAL
  Filled 2016-11-12: qty 1

## 2016-11-12 MED ORDER — ACETAMINOPHEN 325 MG PO TABS: 650.0000 mg | ORAL_TABLET | ORAL | Status: DC | PRN

## 2016-11-12 MED ORDER — BISACODYL 5 MG PO TBEC: 5.0000 mg | DELAYED_RELEASE_TABLET | Freq: Every day | ORAL | Status: DC | PRN

## 2016-11-12 MED ORDER — NAPROXEN 250 MG PO TABS
500.0000 mg | ORAL_TABLET | Freq: Two times a day (BID) | ORAL | Status: DC
  Administered 2016-11-12: 500 mg via ORAL
  Filled 2016-11-12: qty 2

## 2016-11-12 MED ORDER — MENTHOL 3 MG MT LOZG: 1.0000 | LOZENGE | OROMUCOSAL | Status: DC | PRN

## 2016-11-12 MED ORDER — METHOCARBAMOL 500 MG PO TABS
500.0000 mg | ORAL_TABLET | Freq: Four times a day (QID) | ORAL | Status: DC | PRN
  Administered 2016-11-12: 500 mg via ORAL
  Filled 2016-11-12: qty 1

## 2016-11-12 MED ORDER — DEXAMETHASONE SODIUM PHOSPHATE 10 MG/ML IJ SOLN
INTRAMUSCULAR | Status: AC
  Filled 2016-11-12: qty 1

## 2016-11-12 MED ORDER — METHOCARBAMOL 1000 MG/10ML IJ SOLN: 500.0000 mg | Freq: Four times a day (QID) | INTRAVENOUS | Status: DC | PRN

## 2016-11-12 MED ORDER — BUPIVACAINE-EPINEPHRINE (PF) 0.5% -1:200000 IJ SOLN
INTRAMUSCULAR | Status: AC
  Filled 2016-11-12: qty 30

## 2016-11-12 MED ORDER — CELECOXIB 200 MG PO CAPS
200.0000 mg | ORAL_CAPSULE | Freq: Two times a day (BID) | ORAL | Status: DC
  Administered 2016-11-12: 200 mg via ORAL
  Filled 2016-11-12: qty 1

## 2016-11-12 MED ORDER — ONDANSETRON HCL 4 MG/2ML IJ SOLN: 4.0000 mg | Freq: Four times a day (QID) | INTRAMUSCULAR | Status: DC | PRN

## 2016-11-12 MED ORDER — ROCURONIUM BROMIDE 10 MG/ML (PF) SYRINGE
PREFILLED_SYRINGE | INTRAVENOUS | Status: DC | PRN
  Administered 2016-11-12: 10 mg via INTRAVENOUS
  Administered 2016-11-12: 50 mg via INTRAVENOUS

## 2016-11-12 MED ORDER — THROMBIN 20000 UNITS EX SOLR
CUTANEOUS | Status: DC | PRN
  Administered 2016-11-12: 14:00:00 via TOPICAL

## 2016-11-12 MED ORDER — BUPIVACAINE HCL 0.5 % IJ SOLN
INTRAMUSCULAR | Status: DC | PRN
  Administered 2016-11-12: 30 mL

## 2016-11-12 MED ORDER — PHENYLEPHRINE 40 MCG/ML (10ML) SYRINGE FOR IV PUSH (FOR BLOOD PRESSURE SUPPORT)
PREFILLED_SYRINGE | INTRAVENOUS | Status: AC
  Filled 2016-11-12: qty 10

## 2016-11-12 MED ORDER — BUPIVACAINE LIPOSOME 1.3 % IJ SUSP
20.0000 mL | INTRAMUSCULAR | Status: DC
  Filled 2016-11-12: qty 20

## 2016-11-12 MED ORDER — FLEET ENEMA 7-19 GM/118ML RE ENEM: 1.0000 | ENEMA | Freq: Once | RECTAL | Status: DC | PRN

## 2016-11-12 MED ORDER — DEXAMETHASONE SODIUM PHOSPHATE 10 MG/ML IJ SOLN
INTRAMUSCULAR | Status: DC | PRN
  Administered 2016-11-12: 10 mg via INTRAVENOUS

## 2016-11-12 MED ORDER — SODIUM CHLORIDE 0.9 % IV SOLN: 250.0000 mL | INTRAVENOUS | Status: DC

## 2016-11-12 MED ORDER — CEFAZOLIN SODIUM-DEXTROSE 2-4 GM/100ML-% IV SOLN
INTRAVENOUS | Status: AC
  Filled 2016-11-12: qty 100

## 2016-11-12 MED ORDER — LIDOCAINE 2% (20 MG/ML) 5 ML SYRINGE
INTRAMUSCULAR | Status: DC | PRN
  Administered 2016-11-12: 100 mg via INTRAVENOUS

## 2016-11-12 MED ORDER — HYDROMORPHONE HCL 1 MG/ML IJ SOLN: 0.2500 mg | INTRAMUSCULAR | Status: DC | PRN

## 2016-11-12 MED ORDER — LIDOCAINE HCL (CARDIAC) 20 MG/ML IV SOLN
INTRAVENOUS | Status: AC
  Filled 2016-11-12: qty 5

## 2016-11-12 MED ORDER — FENTANYL CITRATE (PF) 250 MCG/5ML IJ SOLN
INTRAMUSCULAR | Status: DC | PRN
  Administered 2016-11-12: 50 ug via INTRAVENOUS
  Administered 2016-11-12: 100 ug via INTRAVENOUS

## 2016-11-12 MED ORDER — BUPIVACAINE HCL (PF) 0.5 % IJ SOLN
INTRAMUSCULAR | Status: AC
  Filled 2016-11-12: qty 30

## 2016-11-12 MED ORDER — GABAPENTIN 300 MG PO CAPS
300.0000 mg | ORAL_CAPSULE | Freq: Three times a day (TID) | ORAL | Status: DC
  Administered 2016-11-12 (×2): 300 mg via ORAL
  Filled 2016-11-12 (×2): qty 1

## 2016-11-12 SURGICAL SUPPLY — 49 items
ADH SKN CLS APL DERMABOND .7 (GAUZE/BANDAGES/DRESSINGS) ×1
BUR RND FLUTED 2.5 (BURR) ×1 IMPLANT
BUR SABER RD CUTTING 3.0 (BURR) ×1 IMPLANT
CANISTER SUCT 3000ML PPV (MISCELLANEOUS) ×2 IMPLANT
COVER SURGICAL LIGHT HANDLE (MISCELLANEOUS) ×2 IMPLANT
DERMABOND ADVANCED (GAUZE/BANDAGES/DRESSINGS) ×1
DERMABOND ADVANCED .7 DNX12 (GAUZE/BANDAGES/DRESSINGS) ×1 IMPLANT
DRAPE HALF SHEET 40X57 (DRAPES) IMPLANT
DRAPE INCISE IOBAN 66X45 STRL (DRAPES) ×1 IMPLANT
DRAPE MICROSCOPE LEICA (MISCELLANEOUS) ×2 IMPLANT
DRAPE SURG 17X23 STRL (DRAPES) ×8 IMPLANT
DRSG MEPILEX BORDER 4X4 (GAUZE/BANDAGES/DRESSINGS) ×1 IMPLANT
DRSG MEPILEX BORDER 4X8 (GAUZE/BANDAGES/DRESSINGS) IMPLANT
DURAPREP 26ML APPLICATOR (WOUND CARE) ×2 IMPLANT
ELECT REM PT RETURN 9FT ADLT (ELECTROSURGICAL) ×2
ELECTRODE REM PT RTRN 9FT ADLT (ELECTROSURGICAL) ×1 IMPLANT
EVACUATOR 1/8 PVC DRAIN (DRAIN) IMPLANT
GLOVE BIOGEL PI IND STRL 8 (GLOVE) ×1 IMPLANT
GLOVE BIOGEL PI INDICATOR 8 (GLOVE) ×1
GLOVE ECLIPSE 9.0 STRL (GLOVE) ×2 IMPLANT
GLOVE ORTHO TXT STRL SZ7.5 (GLOVE) ×2 IMPLANT
GLOVE SURG 8.5 LATEX PF (GLOVE) ×2 IMPLANT
GOWN STRL REUS W/ TWL LRG LVL3 (GOWN DISPOSABLE) ×1 IMPLANT
GOWN STRL REUS W/TWL 2XL LVL3 (GOWN DISPOSABLE) ×4 IMPLANT
GOWN STRL REUS W/TWL LRG LVL3 (GOWN DISPOSABLE) ×2
KIT BASIN OR (CUSTOM PROCEDURE TRAY) ×2 IMPLANT
KIT ROOM TURNOVER OR (KITS) ×2 IMPLANT
NDL SPNL 18GX3.5 QUINCKE PK (NEEDLE) ×2 IMPLANT
NEEDLE SPNL 18GX3.5 QUINCKE PK (NEEDLE) ×4 IMPLANT
NS IRRIG 1000ML POUR BTL (IV SOLUTION) ×2 IMPLANT
PACK LAMINECTOMY ORTHO (CUSTOM PROCEDURE TRAY) ×2 IMPLANT
PAD ARMBOARD 7.5X6 YLW CONV (MISCELLANEOUS) ×4 IMPLANT
PATTIES SURGICAL .5 X.5 (GAUZE/BANDAGES/DRESSINGS) IMPLANT
PATTIES SURGICAL .75X.75 (GAUZE/BANDAGES/DRESSINGS) IMPLANT
PATTIES SURGICAL 1X1 (DISPOSABLE) IMPLANT
SPONGE LAP 4X18 X RAY DECT (DISPOSABLE) ×1 IMPLANT
SPONGE SURGIFOAM ABS GEL 100 (HEMOSTASIS) ×1 IMPLANT
SUT VIC AB 0 CT1 27 (SUTURE) ×2
SUT VIC AB 0 CT1 27XBRD ANBCTR (SUTURE) IMPLANT
SUT VIC AB 1 CT1 27 (SUTURE) ×2
SUT VIC AB 1 CT1 27XBRD ANBCTR (SUTURE) IMPLANT
SUT VIC AB 2-0 CT1 27 (SUTURE) ×2
SUT VIC AB 2-0 CT1 TAPERPNT 27 (SUTURE) IMPLANT
SUT VIC AB 3-0 X1 27 (SUTURE) ×2 IMPLANT
SUT VICRYL 0 UR6 27IN ABS (SUTURE) ×2 IMPLANT
TOWEL OR 17X24 6PK STRL BLUE (TOWEL DISPOSABLE) ×2 IMPLANT
TOWEL OR 17X26 10 PK STRL BLUE (TOWEL DISPOSABLE) ×2 IMPLANT
TRAY FOLEY W/METER SILVER 16FR (SET/KITS/TRAYS/PACK) IMPLANT
WATER STERILE IRR 1000ML POUR (IV SOLUTION) ×2 IMPLANT

## 2016-11-12 NOTE — Discharge Instructions (Signed)
    No lifting greater than 10 lbs. Avoid bending, stooping and twisting. Walk in house for first week them may start to get out slowly increasing distance up to one mile by 3 weeks post op. Keep incision dry for 3 days, may use tegaderm or similar water impervious dressing.  

## 2016-11-12 NOTE — Anesthesia Preprocedure Evaluation (Signed)
Anesthesia Evaluation  Patient identified by MRN, date of birth, ID band Patient awake    Reviewed: Allergy & Precautions, NPO status , Patient's Chart, lab work & pertinent test results  Airway Mallampati: I  TM Distance: >3 FB Neck ROM: Full    Dental   Pulmonary former smoker,    Pulmonary exam normal        Cardiovascular Normal cardiovascular exam     Neuro/Psych Depression    GI/Hepatic GERD  Medicated and Controlled,  Endo/Other    Renal/GU      Musculoskeletal   Abdominal   Peds  Hematology   Anesthesia Other Findings   Reproductive/Obstetrics                             Anesthesia Physical Anesthesia Plan  ASA: II  Anesthesia Plan: General   Post-op Pain Management:    Induction:   PONV Risk Score and Plan: 2 and Ondansetron and Dexamethasone  Airway Management Planned:   Additional Equipment:   Intra-op Plan:   Post-operative Plan: Extubation in OR  Informed Consent: I have reviewed the patients History and Physical, chart, labs and discussed the procedure including the risks, benefits and alternatives for the proposed anesthesia with the patient or authorized representative who has indicated his/her understanding and acceptance.     Plan Discussed with: CRNA and Surgeon  Anesthesia Plan Comments:         Anesthesia Quick Evaluation

## 2016-11-12 NOTE — Interval H&P Note (Signed)
History and Physical Interval Note:  11/12/2016 1:16 PM  [name]  has presented today for surgery, with the diagnosis of right L5-S1 herniated nucleus pulposus  The various methods of treatment have been discussed with the patient and family. After consideration of risks, benefits and other options for treatment, the patient has consented to  Procedure(s): MICRODISCECTOMY RIGHT L5-S1 (N/A) as a surgical intervention .  The patient's history has been reviewed, patient examined, no change in status, stable for surgery.  I have reviewed the patient's chart and labs.  Questions were answered to the patient's satisfaction.     Jessy Oto

## 2016-11-12 NOTE — Anesthesia Postprocedure Evaluation (Signed)
Anesthesia Post Note  Patient: [name]  Procedure(s) Performed: Procedure(s) (LRB): MICRODISCECTOMY RIGHT L5-S1 (N/A)     Patient location during evaluation: PACU Anesthesia Type: General Level of consciousness: awake and alert Pain management: pain level controlled Vital Signs Assessment: post-procedure vital signs reviewed and stable Respiratory status: spontaneous breathing, nonlabored ventilation, respiratory function stable and patient connected to nasal cannula oxygen Cardiovascular status: blood pressure returned to baseline and stable Postop Assessment: no signs of nausea or vomiting Anesthetic complications: no    Last Vitals:  Vitals:   11/12/16 1528 11/12/16 1530  BP: (!) 126/92   Pulse: (!) 57 (!) 55  Resp: 14 10  Temp:      Last Pain:  Vitals:   11/12/16 1543  TempSrc:   PainSc: 0-No pain                 Ohn Bostic DAVID

## 2016-11-12 NOTE — Transfer of Care (Signed)
Immediate Anesthesia Transfer of Care Note  Patient: [name]  Procedure(s) Performed: Procedure(s): MICRODISCECTOMY RIGHT L5-S1 (N/A)  Patient Location: PACU  Anesthesia Type:General  Level of Consciousness: awake, alert , oriented and patient cooperative  Airway & Oxygen Therapy: Patient Spontanous Breathing  Post-op Assessment: Report given to RN and Post -op Vital signs reviewed and stable  Post vital signs: Reviewed and stable  Last Vitals:  Vitals:   11/12/16 1033  BP: (!) 138/92  Pulse: 68  Resp: 20  Temp: 36.8 C    Last Pain:  Vitals:   11/12/16 1042  TempSrc:   PainSc: 3          Complications: No apparent anesthesia complications

## 2016-11-12 NOTE — Brief Op Note (Signed)
11/12/2016  3:01 PM  PATIENT:  [name]  41 y.o. male  PRE-OPERATIVE DIAGNOSIS:  right L5-S1 herniated nucleus pulposus  POST-OPERATIVE DIAGNOSIS:  right L5-S1 herniated nucleus pulposus  PROCEDURE:  Procedure(s): MICRODISCECTOMY RIGHT L5-S1 (N/A)  SURGEON:  Surgeon(s) and Role:    * Jessy Oto, MD - Primary  PHYSICIAN ASSISTANT: Esaw Grandchild  ANESTHESIA:   local and general, Dr. Conrad [place]  EBL:  Total I/O In: 1000 [I.V.:1000] Out: 19 [Blood:50]  BLOOD ADMINISTERED:none  DRAINS: none   LOCAL MEDICATIONS USED:  MARCAINE0.5% 1:1 Exparel 1.3% Amount: 20 ml  SPECIMEN:  Source of Specimen:  Right lumbar L5-S1 disc material, history of metastatic salivary ca.  DISPOSITION OF SPECIMEN:  PATHOLOGY  COUNTS:  YES  TOURNIQUET:  * No tourniquets in log *  DICTATION: .Dragon Dictation  PLAN OF CARE: Admit for overnight observation  PATIENT DISPOSITION:  PACU - hemodynamically stable.   Delay start of Pharmacological VTE agent (>24hrs) due to surgical blood loss or risk of bleeding: yes

## 2016-11-12 NOTE — Anesthesia Procedure Notes (Signed)
Procedure Name: Intubation Date/Time: 11/12/2016 1:32 PM Performed by: Freddie Breech Pre-anesthesia Checklist: Patient identified, Emergency Drugs available, Suction available and Patient being monitored Patient Re-evaluated:Patient Re-evaluated prior to inductionOxygen Delivery Method: Circle System Utilized Preoxygenation: Pre-oxygenation with 100% oxygen Intubation Type: IV induction Ventilation: Mask ventilation without difficulty Laryngoscope Size: Mac and 4 Grade View: Grade I Tube type: Oral Tube size: 7.5 mm Number of attempts: 1 Airway Equipment and Method: Stylet and Oral airway Placement Confirmation: ETT inserted through vocal cords under direct vision,  positive ETCO2 and breath sounds checked- equal and bilateral Secured at: 23 cm Tube secured with: Tape Dental Injury: Teeth and Oropharynx as per pre-operative assessment

## 2016-11-12 NOTE — Telephone Encounter (Signed)
Discussed at the hospital today, he will likely be out 4-6 weeks. jen

## 2016-11-12 NOTE — H&P (Signed)
[name] is an 41 y.o. male.   Chief Complaint: Worsening back pain and right lower extremity radiculopathy HPI: Patient with history of right L5-S1 HNP and the above complaint presents to the hospital today for surgical intervention. Progressively worsening symptoms. Failed conservative treatment.  Past Medical History:  Diagnosis Date  . Arthritis   . Cancer ([place])   . 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 Depression   . GERD (gastroesophageal reflux disease)    otc  . History of kidney stones   . History of stress test    workup done for syncope   . 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 Syncopal episodes   . Tinnitus of both ears     Past Surgical History:  Procedure Laterality Date  . NECK SURGERY    . none    . SUBMANDIBULAR GLAND EXCISION Right 71/24/5809   DR Erik Obey  . SUBMANDIBULAR GLAND EXCISION Right 07/12/2014   Procedure: EXCISION RIGHT SUBMANDIBULAR GLAND;  Surgeon: [name], MD;  Location: [facility];  Service: ENT;  Laterality: Right;  . SUBMANDIBULAR GLAND EXCISION Right 08/12/2014   Procedure: Selected neck dissection;  Surgeon: [name], MD;  Location: [facility] OR;  Service: ENT;  Laterality: Right;    Family History  Problem Relation Age of Onset  . Adopted: Yes  . Family history unknown: Yes   Social History:  reports that he quit smoking about 5 years ago. He has a 4.50 pack-year smoking history. He has quit using smokeless tobacco. His smokeless tobacco use included Chew. He reports that he drinks alcohol. He reports that he uses drugs, including Marijuana.  Allergies:  Allergies  Allergen Reactions  . Hydrocodone Itching    No prescriptions prior to admission.    Results for orders placed or performed during the hospital encounter of 11/11/16 (from the past 48 hour(s))  Surgical pcr screen     Status: None   Collection Time: 11/11/16  2:24 PM  Result Value Ref Range   MRSA, PCR NEGATIVE NEGATIVE   Staphylococcus aureus NEGATIVE NEGATIVE   Comment:        The Xpert SA Assay (FDA approved for NASAL specimens in patients over 38 years of age), is one component of a comprehensive surveillance program.  Test performance has been validated by [facility] for patients greater than or equal to 41 year old. It is not intended to diagnose infection nor to guide or monitor treatment.   Urinalysis, Routine w reflex microscopic     Status: Abnormal   Collection Time: 11/11/16  2:25 PM  Result Value Ref Range   Color, Urine YELLOW YELLOW   APPearance HAZY (A) CLEAR   Specific Gravity, Urine 1.012 1.005 - 1.030   pH 7.0 5.0 - 8.0   Glucose, UA NEGATIVE NEGATIVE mg/dL   Hgb urine dipstick NEGATIVE NEGATIVE   Bilirubin Urine NEGATIVE NEGATIVE   Ketones, ur NEGATIVE NEGATIVE mg/dL   Protein, ur NEGATIVE NEGATIVE mg/dL   Nitrite NEGATIVE NEGATIVE   Leukocytes, UA NEGATIVE NEGATIVE  APTT     Status: None   Collection Time: 11/11/16  2:26 PM  Result Value Ref Range   aPTT 26 24 - 36 seconds  CBC     Status: Abnormal   Collection Time: 11/11/16  2:26 PM  Result Value Ref Range   WBC 7.9 4.0 - 10.5 K/uL   RBC 4.43 4.22 - 5.81 MIL/uL   Hemoglobin 12.6 (L) 13.0 - 17.0 g/dL   HCT 38.4 (L)  39.0 - 52.0 %   MCV 86.7 78.0 - 100.0 fL   MCH 28.4 26.0 - 34.0 pg   MCHC 32.8 30.0 - 36.0 g/dL   RDW 13.2 11.5 - 15.5 %   Platelets 196 150 - 400 K/uL  Comprehensive metabolic panel     Status: Abnormal   Collection Time: 11/11/16  2:26 PM  Result Value Ref Range   Sodium 140 135 - 145 mmol/L   Potassium 3.7 3.5 - 5.1 mmol/L   Chloride 107 101 - 111 mmol/L   CO2 25 22 - 32 mmol/L   Glucose, Bld 86 65 - 99 mg/dL   BUN 22 (H) 6 - 20 mg/dL   Creatinine, Ser 1.01 0.61 - 1.24 mg/dL   Calcium 9.2 8.9 - 10.3 mg/dL   Total Protein 6.4 (L) 6.5 - 8.1 g/dL   Albumin 4.1 3.5 - 5.0 g/dL   AST 30 15 - 41 U/L   ALT 24 17 - 63 U/L   Alkaline Phosphatase 58 38 - 126 U/L   Total Bilirubin 0.6 0.3 - 1.2 mg/dL   GFR calc non Af Amer >60 >60 mL/min    GFR calc Af Amer >60 >60 mL/min    Comment: (NOTE) The eGFR has been calculated using the CKD EPI equation. This calculation has not been validated in all clinical situations. eGFR's persistently <60 mL/min signify possible Chronic Kidney Disease.    Anion gap 8 5 - 15  Protime-INR     Status: None   Collection Time: 11/11/16  2:26 PM  Result Value Ref Range   Prothrombin Time 13.2 11.4 - 15.2 seconds   INR 1.00    No results found.  Review of Systems  Constitutional: Negative.   HENT: Negative.   Eyes: Negative.   Respiratory: Negative.   Cardiovascular: Negative.   Gastrointestinal: Negative.   Genitourinary: Negative.   Musculoskeletal: Positive for back pain.  Skin: Negative.   Neurological: Positive for tingling.  Psychiatric/Behavioral: Negative.     There were no vitals taken for this visit. Physical Exam  Constitutional: He is oriented to person, place, and time. He appears well-developed. No distress.  HENT:  Head: Normocephalic.  Eyes: EOM are normal. Pupils are equal, round, and reactive to light.  Neck: Normal range of motion.  Respiratory: Effort normal. No respiratory distress.  GI: He exhibits distension.  Musculoskeletal: He exhibits tenderness.  Positive right straight leg raise  Neurological: He is alert and oriented to person, place, and time.  Skin: Skin is warm and dry.  Psychiatric: He has a normal mood and affect.     Assessment/Plan Right L5-S1 HNP, low back pain and right lower extremity radiculopathy   Will proceed with MICRODISCECTOMY RIGHT L5-S1 As scheduled. Surgical procedure along with possible risks and comp locations discussed. All questions answered. Benjiman Core, PA-C 11/12/2016, 9:59 AM

## 2016-11-12 NOTE — Op Note (Signed)
11/12/2016  3:05 PM  PATIENT:  [name]  41 y.o. male  MRN: [id number]  OPERATIVE REPORT  PRE-OPERATIVE DIAGNOSIS:  right L5-S1 herniated nucleus pulposus  POST-OPERATIVE DIAGNOSIS:  right L5-S1 herniated nucleus pulposus  PROCEDURE:  Procedure(s): MICRODISCECTOMY RIGHT L5-S1    SURGEON:  Jessy Oto, MD     ASSISTANT:  Benjiman Core, PA-C  (Present throughout the entire procedure and necessary for completion of procedure in a timely manner)     ANESTHESIA:  General,supplemented with local marcaine 0.5% 1:1 exparel 1.3% total 20cc.    COMPLICATIONS:  None.     SPECIMEN: Disc material right L5-S1 disc space. History of salifa   PROCEDURE:The patient was met in the holding area, and the appropriate right Lumbar level L5-S1 identified and marked with "x" and my initials.The patient was then transported to OR and was placed under general anesthesia without difficulty. The patient received appropriate preoperative antibiotic prophylaxis. The patient after intubation atraumatically was transferred to the operating room table, prone position, Wilson frame, sliding OR table. All pressure points were well padded. The arms in 90-90 well-padded at the elbows. Standard prep with DuraPrep solution lower dorsal spine to the mid sacral segment. Draped in the usual manner iodine Vi-Drape was used. Time-out procedure was called and correct. An 18-gauge spinal needle was then inserted at the expected L5-S1 level.  C-arm was draped sterilely to the field and used to identify the spinal needles positions. The needle was at the lower aspect of the lamina of L5. Skin superior to this was then infiltrated with marcaine half percent 1:1 exparel 1.3% total of 20 cc used. An incision approximately an inch inch and a half in length was then made through skin and subcutaneous layers in line with the left side of the expected midline just superior to the spinal needle entry point. An incision made into the left  lumbosacral fascia approximately an inch in length .   Cobb elevator then introduced into the incision site and used to carefully form subperiosteal movement of the right paralumbar muscles off of the posterior lamina of the expected L5-S1 level. 60 mm retractors and placed on the scaffolding for the Boss McCoullough retractor and guided down to and docking on the posterior aspect of the lamina at the expected right L5-S1 level.  Cross table lateral radiograph was used to identified the dilators and the retractors and a penfield #4 at the appropriate level L5-S1. The operating room microscope sterilely draped brought into the field. Under the operating room microscope, the L5-S1 interspace carefully debrided the small amount of muscle attachment here and high-speed bur used to drill the medial aspect of the inferior articular process of L5 approximately 10%. A localization lateral C-arm view was obtained with Penfield 4 in the L5-S1 facet. 2 mm Kerrison then used to enter the spinal canal over the superior aspect of the S1 lamina carefully using the Kerrison to debris the attachment as a curet. Foraminotomy was then performed over the right S1 nerve root. The medial 10% superior articular process of S1 and then resected using 2 mm Kerrison. This allowed for identification of the thecal sac. Penfield 4 was then used to carefully mobilize the thecal sac medially and the S1 nerve root identified within the lateral recess flattened over the posterior aspect of the herniated disc. Carefully the lateral aspect of the S1 nerve root was identified and a Derricho was used to mobilize the nerve medially such that the herniated disc was visible  with microscope. Using a Derricho for retraction and a 15 blade scalpel was used to incise the posterior longitudinal ligament within the lateral recess on the left side longitudinally. Disc material immediately extruded and this was removed using micropituitary rongeurs and nerve  hook nerve root and then more easily able to be mobilized medially and retracted using a love retractor. Further foraminotomies was performed over the L5 nerve root the nerve root was noted to be well decompressed without further compression. The nerve root able to be retracted along the medial aspect of the S1pedicle and disc material found to be subligamentous at this level was further resected current pituitary rongeurs. Ligamentum flavum was further debrided superiorly to the level L5-S1 disc. Had a moderate amount of further resection of the S1 lamina inferiorly was performed. With this then the disc space at L5-S1 was easily visualized and entry into the disc at the sided disc herniation was possible using a Penfield 4 intraoperative.  Micropituitary was used to further debride this material superficially from the posterior aspect of the intervertebral disc is posterior lateral aspect of the disc. Small amount of further disc material was found subligamentous extending inferiorly from the disc this was removed using micropituitary rongeurs. Ligamentum flavum was debrided and lateral recess along the medial aspect L5-S1 facet no further decompression was necessary. Ball tip nerve probe was then able to carefully palpate the neuroforamen for L5 and S1 finding these to be well decompressed. Bleeding was then controlled using thrombin-soaked Gelfoam small cottonoids.  Small amount of bleeding within the soft tissue mass the laminotomy area was controlled using bipolar electrocautery. Irrigation was carried out using copious amounts of irrigant solution. All Gelfoam  were then removed. No significant active bleeding present at the time of removal. All instruments sponge counts were correct traction system was then carefully removed carefully rotating retractors with this withdrawal and only bipolar electrocautery of any small bleeders. Lumbodorsal fascia was then carefully approximated with interrupted 0 Vicryl  sutures, UR 6 needle deep subcutaneous layers were approximated with interrupted 0 Vicryl sutures on UR 6 the appear subcutaneous layers approximated with interrupted 2-0 Vicryl sutures and the skin closed with a running subcutaneous stitch of 4-0 Vicryl. Dermabond was applied allowed to dry and then Mepilex bandage applied. Patient was then carefully returned to supine position on a stretcher, reactivated and extubated. He was then returned to recovery room in satisfactory condition.  Benjiman Core, PA-C perform the duties of assistant surgeon during this case. he was present from the beginning of the case to the end of the case assisting in transfer the patient from his stretcher to the OR table and back to the stretcher at the end of the case. Assisted in careful retraction and suction of the laminectomy site delicate neural structures operating under the operating room microscope. He performed closure of the incision from the fascia to the skin applying the dressing.     Jessy Oto 11/12/2016, 3:05 PM

## 2016-11-13 ENCOUNTER — Encounter (HOSPITAL_COMMUNITY): Payer: Self-pay | Admitting: Specialist

## 2016-11-13 DIAGNOSIS — M5117 Intervertebral disc disorders with radiculopathy, lumbosacral region: Secondary | ICD-10-CM | POA: Diagnosis not present

## 2016-11-13 NOTE — Progress Notes (Signed)
     Subjective: 1 Day Post-Op Procedure(s) (LRB): MICRODISCECTOMY RIGHT L5-S1 (N/A) Awake, alert and oriented x 4. Incision with minimal drainage.  Patient reports pain as mild.    Objective:   VITALS:  Temp:  [97.5 F (36.4 C)-98.4 F (36.9 C)] 97.5 F (36.4 C) (07/07 0821) Pulse Rate:  [55-91] 66 (07/07 0821) Resp:  [10-20] 18 (07/07 0821) BP: (124-138)/(76-96) 131/96 (07/07 0821) SpO2:  [97 %-100 %] 98 % (07/07 0821) Weight:  [198 lb (89.8 kg)] 198 lb (89.8 kg) (07/06 1033)  Neurologically intact ABD soft Neurovascular intact Sensation intact distally Intact pulses distally Dorsiflexion/Plantar flexion intact Incision: no drainage No cellulitis present   LABS  Recent Labs  11/11/16 1426  HGB 12.6*  WBC 7.9  PLT 196    Recent Labs  11/11/16 1426  NA 140  K 3.7  CL 107  CO2 25  BUN 22*  CREATININE 1.01  GLUCOSE 86    Recent Labs  11/11/16 1426  INR 1.00     Assessment/Plan: 1 Day Post-Op Procedure(s) (LRB): MICRODISCECTOMY RIGHT L5-S1 (N/A)  Advance diet Up with therapy Discharge home with home health  Jessy Oto 11/13/2016, 9:34 AM Patient ID: [name], male   DOB: Jul 02, 1975, 41 y.o.   MRN: [id number]

## 2016-11-13 NOTE — Evaluation (Signed)
Occupational Therapy Evaluation and Discharge Patient Details Name: [name] MRN: [id number] DOB: 11/02/1975 Today's Date: 11/13/2016    History of Present Illness MICRODISCECTOMY RIGHT L5-S1   Clinical Impression   This 41 yo male admitted and underwent above presents to acute OT with all education completed and no further questions. No PT needs identified and I have made them aware. Acute OT will sign off.     Follow Up Recommendations  No OT follow up    Equipment Recommendations  None recommended by OT       Precautions / Restrictions Precautions Precautions: Back Precaution Booklet Issued: Yes (comment) Restrictions Weight Bearing Restrictions: No      Mobility Bed Mobility               General bed mobility comments: Pt up and walking halls when I met up with him, I did edcucate him on correct method of getting in and out of bed to protect back  Transfers Overall transfer level: Independent               General transfer comment: Pt up and down 10 steps with 1 rail Mod I        ADL either performed or assessed with clinical judgement   ADL Overall ADL's : Modified independent                                       General ADL Comments: Educated pt on LBD, how to keep back straight with sit<>stand (Pigeon toed), use of wet wipes for back peri-area, standing tall when brushing teeth. Pt asked me about wearing brace (that he had previously) at work, using invert table, and doing yoga stretches--I advised him to check with Dr. Louanne Skye for clarification at his follow up appointment     Vision Patient Visual Report: No change from baseline              Pertinent Vitals/Pain Pain Assessment: 0-10 Pain Score: 1  Pain Location: right hip at side Pain Descriptors / Indicators: Sore Pain Intervention(s):  (none needed)     Hand Dominance Right   Extremity/Trunk Assessment Upper Extremity Assessment Upper Extremity Assessment:  Overall WFL for tasks assessed   Lower Extremity Assessment Lower Extremity Assessment: Overall WFL for tasks assessed       Communication Communication Communication: No difficulties   Cognition Arousal/Alertness: Awake/alert Behavior During Therapy: WFL for tasks assessed/performed Overall Cognitive Status: Within Functional Limits for tasks assessed                                                Home Living Family/patient expects to be discharged to:: Private residence Living Arrangements: Spouse/significant other;Children Available Help at Discharge: Family;Available 24 hours/day Type of Home: House Home Access: Stairs to enter [employer] of Steps: 7 Entrance Stairs-Rails: Right;Left;Can reach both           Bathroom Toilet: Standard     Home Equipment: None          Prior Functioning/Environment Level of Independence: Independent                          OT Goals(Current goals can be found in the care plan section) Acute  Rehab OT Goals Patient Stated Goal: home today  OT Frequency:                AM-PAC PT "6 Clicks" Daily Activity     Outcome Measure Help from another person eating meals?: None Help from another person taking care of personal grooming?: None Help from another person toileting, which includes using toliet, bedpan, or urinal?: None Help from another person bathing (including washing, rinsing, drying)?: None Help from another person to put on and taking off regular upper body clothing?: None Help from another person to put on and taking off regular lower body clothing?: None 6 Click Score: 24   End of Session Equipment Utilized During Treatment:  (none) Nurse Communication:  (pt ready to go from therapy standpoint)  Activity Tolerance: Patient tolerated treatment well Patient left:  (up in room)  OT Visit Diagnosis: Pain Pain - Right/Left: Right Pain - part of body: Hip                 Time: 4849-4835 OT Time Calculation (min): 13 min Charges:  OT General Charges $OT Visit: 1 Procedure OT Evaluation $OT Eval Moderate Complexity: 1 Procedure G-Codes: OT G-codes **NOT FOR INPATIENT CLASS** Functional Assessment Tool Used: AM-PAC 6 Clicks Daily Activity Functional Limitation: Self care Self Care Current Status (V9976): 0 percent impaired, limited or restricted Self Care Goal Status (W2357): 0 percent impaired, limited or restricted Self Care Discharge Status (P5619): 0 percent impaired, limited or restricted   Golden [address], OTR/L [phone] 11/13/2016

## 2016-11-13 NOTE — Discharge Summary (Signed)
Physician Discharge Summary      Patient ID: [name] MRN: [id number] DOB/AGE: 12-09-1975 41 y.o.  Admit date: 11/12/2016 Discharge date:11/13/2016   Admission Diagnoses:  Active Problems:   Herniated intervertebral disc of lumbar spine   S/P lumbar laminectomy   Discharge Diagnoses:  Same  Past Medical History:  Diagnosis Date  . Arthritis   . Cancer ([facility])   . 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 Depression   . GERD (gastroesophageal reflux disease)    otc  . History of kidney stones   . History of stress test    workup done for syncope   . 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 Syncopal episodes   . Tinnitus of both ears     Surgeries: Procedure(s): MICRODISCECTOMY RIGHT L5-S1 on 11/12/2016   Consultants:   Discharged Condition: Improved  Hospital Course: [name] is an 41 y.o. male who was admitted 11/12/2016 with a chief complaint of No chief complaint on file. , and found to have a diagnosis of <principal problem not specified>.  He was brought to the operating room on 11/12/2016 and underwent the above named procedures.    He was given perioperative antibiotics:  Anti-infectives    Start     Dose/Rate Route Frequency Ordered Stop   11/12/16 2200  ceFAZolin (ANCEF) IVPB 2g/100 mL premix     2 g 200 mL/hr over 30 Minutes Intravenous Every 8 hours 11/12/16 1610 11/13/16 0625   11/12/16 1028  ceFAZolin (ANCEF) IVPB 2g/100 mL premix     2 g 200 mL/hr over 30 Minutes Intravenous On call to O.R. 11/12/16 1028 11/12/16 1345   11/12/16 1021  ceFAZolin (ANCEF) 2-4 GM/100ML-% IVPB    Comments:  Tamsen Snider   : cabinet override      11/12/16 1021 11/12/16 1340    Recovered uneventfully in the PACU and was transferred to 3 C Room 4. Was am to stand and ambulate in the hallway evening of his surgery. Leg pain was relieved. POD#1 awake, alert and orientedx 4. Neurologically normal exam. Dressing changed without difficulty. Able to tolerate discomfort, taking  minimal amounts of narcotic pain medication. He was discharged home on POD#1 He was given sequential compression devices, early ambulation, and chemoprophylaxis for DVT prophylaxis.  He benefited maximally from their hospital stay and there were no complications.    Recent vital signs:  Vitals:   11/13/16 0500 11/13/16 0821  BP: 124/76 (!) 131/96  Pulse: 63 66  Resp: 18 18  Temp: 97.8 F (36.6 C) (!) 97.5 F (36.4 C)    Recent laboratory studies:  Results for orders placed or performed during the hospital encounter of 11/11/16  Surgical pcr screen  Result Value Ref Range   MRSA, PCR NEGATIVE NEGATIVE   Staphylococcus aureus NEGATIVE NEGATIVE  APTT  Result Value Ref Range   aPTT 26 24 - 36 seconds  CBC  Result Value Ref Range   WBC 7.9 4.0 - 10.5 K/uL   RBC 4.43 4.22 - 5.81 MIL/uL   Hemoglobin 12.6 (L) 13.0 - 17.0 g/dL   HCT 38.4 (L) 39.0 - 52.0 %   MCV 86.7 78.0 - 100.0 fL   MCH 28.4 26.0 - 34.0 pg   MCHC 32.8 30.0 - 36.0 g/dL   RDW 13.2 11.5 - 15.5 %   Platelets 196 150 - 400 K/uL  Comprehensive metabolic panel  Result Value Ref Range   Sodium 140 135 - 145 mmol/L   Potassium 3.7  3.5 - 5.1 mmol/L   Chloride 107 101 - 111 mmol/L   CO2 25 22 - 32 mmol/L   Glucose, Bld 86 65 - 99 mg/dL   BUN 22 (H) 6 - 20 mg/dL   Creatinine, Ser 1.01 0.61 - 1.24 mg/dL   Calcium 9.2 8.9 - 10.3 mg/dL   Total Protein 6.4 (L) 6.5 - 8.1 g/dL   Albumin 4.1 3.5 - 5.0 g/dL   AST 30 15 - 41 U/L   ALT 24 17 - 63 U/L   Alkaline Phosphatase 58 38 - 126 U/L   Total Bilirubin 0.6 0.3 - 1.2 mg/dL   GFR calc non Af Amer >60 >60 mL/min   GFR calc Af Amer >60 >60 mL/min   Anion gap 8 5 - 15  Protime-INR  Result Value Ref Range   Prothrombin Time 13.2 11.4 - 15.2 seconds   INR 1.00   Urinalysis, Routine w reflex microscopic  Result Value Ref Range   Color, Urine YELLOW YELLOW   APPearance HAZY (A) CLEAR   Specific Gravity, Urine 1.012 1.005 - 1.030   pH 7.0 5.0 - 8.0   Glucose, UA NEGATIVE  NEGATIVE mg/dL   Hgb urine dipstick NEGATIVE NEGATIVE   Bilirubin Urine NEGATIVE NEGATIVE   Ketones, ur NEGATIVE NEGATIVE mg/dL   Protein, ur NEGATIVE NEGATIVE mg/dL   Nitrite NEGATIVE NEGATIVE   Leukocytes, UA NEGATIVE NEGATIVE    Discharge Medications:   Allergies as of 11/13/2016      Reactions   Hydrocodone Itching      Medication List    TAKE these medications   ALEVE 220 MG tablet Generic drug:  naproxen sodium Take 440 mg by mouth 4 (four) times daily as needed (PAIN).   diazepam 5 MG tablet Commonly known as:  VALIUM Take 1 tablet (5 mg total) by mouth at bedtime as needed for anxiety.   DULoxetine 60 MG capsule Commonly known as:  CYMBALTA Take 1 capsule (60 mg total) by mouth daily.   fexofenadine 180 MG tablet Commonly known as:  ALLEGRA Take 180 mg by mouth daily as needed for allergies. OTC   loratadine 10 MG tablet Commonly known as:  CLARITIN Take 10 mg by mouth daily as needed for allergies.   multivitamin with minerals Tabs tablet Take 1 tablet by mouth daily.   naproxen 500 MG tablet Commonly known as:  NAPROSYN TAKE ONE TABLET BY MOUTH TWICE DAILY WITH MEALS   oxyCODONE-acetaminophen 10-325 MG tablet Commonly known as:  PERCOCET Take 1 tablet by mouth every 6 (six) hours as needed for pain. What changed:  when to take this            Durable Medical Equipment        Start     Ordered   11/12/16 1611  DME 3 n 1  Once     11/12/16 1610      Diagnostic Studies: Dg Lumbar Spine 2-3 Views  Result Date: 11/12/2016 CLINICAL DATA:  L5-S1 microdiscectomy EXAM: LUMBAR SPINE - 2-3 VIEW COMPARISON:  None. FINDINGS: Two lateral intraoperative views were acquired of the lower lumbar spine. The first image demonstrates a localizing needle projecting over the posterior elements at the S2 level. The second lateral view demonstrates a larger metallic probe with soft tissue retractors projecting over the posterior elements at L5-S1. IMPRESSION: Soft  tissue retractors with metallic probe overlying the posterior elements at L5-S1. Electronically Signed   By: Ashley Royalty M.D.   On: 11/12/2016 17:27  Disposition: 01-Home or Self Care  Discharge Instructions    Call MD / Call 911    Complete by:  As directed    If you experience chest pain or shortness of breath, CALL 911 and be transported to the hospital emergency room.  If you develope a fever above 101 F, pus (white drainage) or increased drainage or redness at the wound, or calf pain, call your surgeon's office.   Constipation Prevention    Complete by:  As directed    Drink plenty of fluids.  Prune juice may be helpful.  You may use a stool softener, such as Colace (over the counter) 100 mg twice a day.  Use MiraLax (over the counter) for constipation as needed.   Diet - low sodium heart healthy    Complete by:  As directed    Discharge instructions    Complete by:  As directed    No lifting greater than 10 lbs. Avoid bending, stooping and twisting. Walk in house for first week them may start to get out slowly increasing distance up to one mile by 3 weeks post op. Keep incision dry for 3 days, may use tegaderm or similar water impervious dressing.   Driving restrictions    Complete by:  As directed    No driving for 2 weeks   Increase activity slowly as tolerated    Complete by:  As directed    Lifting restrictions    Complete by:  As directed    No lifting for 8 weeks      Follow-up Information    Jessy Oto, MD In 2 weeks.   Specialty:  Orthopedic Surgery Why:  For wound re-check Contact information: [place] [address] [phone]            Signed: Jessy Oto 11/13/2016, 9:36 AM

## 2016-11-13 NOTE — Progress Notes (Signed)
Pt doing well. Pt and wife given D/C instructions with verbal understanding. Pt's IV was removed prior to D/C. Pt's incision is clean and dry with no sign of infection. Pt D/C'd home via walking @ 0955 per MD order. Pt is stable @ D/C and has no other needs at this time. Holli Humbles, RN

## 2016-11-13 NOTE — Progress Notes (Signed)
PT Cancellation Note  Patient Details Name: [name] MRN: [id number] DOB: 07-Dec-1975   Cancelled Treatment:    Reason Eval/Treat Not Completed: PT screened, no needs identified, will sign off. Per OT and RN, pt is independent and does not need a therapy evaluation. PT will sign off.    Scheryl Marten PT, DPT  [phone]  11/13/2016, 8:59 AM

## 2016-11-18 ENCOUNTER — Encounter (INDEPENDENT_AMBULATORY_CARE_PROVIDER_SITE_OTHER): Payer: Self-pay | Admitting: Specialist

## 2016-11-18 ENCOUNTER — Encounter: Payer: Self-pay | Admitting: Hematology and Oncology

## 2016-11-29 ENCOUNTER — Other Ambulatory Visit: Payer: Self-pay | Admitting: Family Medicine

## 2016-11-29 DIAGNOSIS — F419 Anxiety disorder, unspecified: Secondary | ICD-10-CM

## 2016-11-29 DIAGNOSIS — F32A Depression, unspecified: Secondary | ICD-10-CM

## 2016-11-29 DIAGNOSIS — F329 Major depressive disorder, single episode, unspecified: Secondary | ICD-10-CM

## 2016-11-29 DIAGNOSIS — C080 Malignant neoplasm of submandibular gland: Secondary | ICD-10-CM

## 2016-11-29 DIAGNOSIS — M5441 Lumbago with sciatica, right side: Secondary | ICD-10-CM

## 2016-11-29 DIAGNOSIS — M5416 Radiculopathy, lumbar region: Secondary | ICD-10-CM

## 2016-12-01 ENCOUNTER — Ambulatory Visit (INDEPENDENT_AMBULATORY_CARE_PROVIDER_SITE_OTHER): Payer: 59 | Admitting: Specialist

## 2016-12-01 ENCOUNTER — Encounter (INDEPENDENT_AMBULATORY_CARE_PROVIDER_SITE_OTHER): Payer: Self-pay | Admitting: Specialist

## 2016-12-01 DIAGNOSIS — Z9889 Other specified postprocedural states: Secondary | ICD-10-CM

## 2016-12-01 NOTE — Patient Instructions (Signed)
Avoid frequent bending and stooping  No lifting greater than 10 lbs. May use ice or moist heat for pain. Weight loss is of benefit.   

## 2016-12-01 NOTE — Progress Notes (Signed)
   Post-Op Visit Note   Patient: [name]           Date of Birth: 1975/11/04           MRN: [id number] Visit Date: 12/01/2016 PCP: Dettinger, Fransisca Kaufmann, MD   Assessment & Plan:3 weeks post L5-S1 laminectomy for HNP L5-S1  Chief Complaint: No chief complaint on file.  Visit Diagnoses:  1. S/P laminectomy   Right SLR is Negative. PCT is negative, Strength is normal . Incision with minimal eschar at the top of the incision site.   Plan: Avoid frequent bending and stooping  No lifting greater than 10 lbs. May use ice or moist heat for pain. Weight loss is of benefit.  Follow-Up Instructions: Return in about 4 weeks (around 12/29/2016).   Orders:  No orders of the defined types were placed in this encounter.  No orders of the defined types were placed in this encounter.   Imaging: No results found.  PMFS History: Patient Active Problem List   Diagnosis Date Noted  . Herniated intervertebral disc of lumbar spine 06/04/2016    Priority: High  . S/P lumbar laminectomy 11/12/2016  . 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 Syncope 02/21/2014  . Chest pain 02/21/2014   Past Medical History:  Diagnosis Date  . Arthritis   . Cancer ([facility])   . Carcinoma ([facility]) 07/2014   adenoid cystic  . Depression   . GERD (gastroesophageal reflux disease)    otc  . History of kidney stones   . History of stress test    workup done for syncope   . 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 Syncopal episodes   . Tinnitus of both ears     Family History  Problem Relation Age of Onset  . Adopted: Yes  . Family history unknown: Yes    Past Surgical History:  Procedure Laterality Date  . LUMBAR LAMINECTOMY/DECOMPRESSION MICRODISCECTOMY N/A 11/12/2016   Procedure: MICRODISCECTOMY RIGHT L5-S1;  Surgeon: Jessy Oto, MD;  Location: [facility];  Service: Orthopedics;  Laterality: N/A;  .  NECK SURGERY    . none    . SUBMANDIBULAR GLAND EXCISION Right 54/62/7035   DR Erik Obey  . SUBMANDIBULAR GLAND EXCISION Right 07/12/2014   Procedure: EXCISION RIGHT SUBMANDIBULAR GLAND;  Surgeon: [name], MD;  Location: [facility];  Service: ENT;  Laterality: Right;  . SUBMANDIBULAR GLAND EXCISION Right 08/12/2014   Procedure: Selected neck dissection;  Surgeon: [name], MD;  Location: [facility];  Service: ENT;  Laterality: Right;   Social History   Occupational History  . Not on file.   Social History Main Topics  . Smoking status: Former Smoker    Packs/day: 0.50    Years: 9.00    Quit date: 02/22/2011  . Smokeless tobacco: Former Systems developer    Types: Chew  . Alcohol use 0.0 oz/week     Comment: occas. beer  STOPPED ON 3/4  . Drug use: Yes    Types: Marijuana     Comment: once weekly---STOPPED IT 3/4  . Sexual activity: Not on file

## 2016-12-30 ENCOUNTER — Encounter (INDEPENDENT_AMBULATORY_CARE_PROVIDER_SITE_OTHER): Payer: Self-pay | Admitting: Specialist

## 2016-12-30 ENCOUNTER — Ambulatory Visit (INDEPENDENT_AMBULATORY_CARE_PROVIDER_SITE_OTHER): Payer: 59 | Admitting: Specialist

## 2016-12-30 DIAGNOSIS — Z9889 Other specified postprocedural states: Secondary | ICD-10-CM

## 2016-12-30 NOTE — Patient Instructions (Signed)
Plan: Avoid frequent bending and stooping  No lifting greater than 30 lbs. May use ice or moist heat for pain. Weight loss is of benefit.

## 2016-12-30 NOTE — Progress Notes (Signed)
   Post-Op Visit Note   Patient: [name]           Date of Birth: 27-May-1975           MRN: [id number] Visit Date: 12/30/2016 PCP: Dettinger, Fransisca Kaufmann, MD   Assessment & Plan: 6 weeks post right L5-S1 microdiscectomy  Chief Complaint: No chief complaint on file.  Visit Diagnoses: No diagnosis found.  Plan: Avoid frequent bending and stooping  No lifting greater than 30 lbs. May use ice or moist heat for pain. Weight loss is of benefit.     Follow-Up Instructions: No Follow-up on file.   Orders:  No orders of the defined types were placed in this encounter.  No orders of the defined types were placed in this encounter.   Imaging: No results found.  PMFS History: Patient Active Problem List   Diagnosis Date Noted  . Herniated intervertebral disc of lumbar spine 06/04/2016    Priority: High  . S/P lumbar laminectomy 11/12/2016  . 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 Syncope 02/21/2014  . Chest pain 02/21/2014   Past Medical History:  Diagnosis Date  . Arthritis   . Cancer ([facility])   . Carcinoma ([facility]) 07/2014   adenoid cystic  . Depression   . GERD (gastroesophageal reflux disease)    otc  . History of kidney stones   . History of stress test    workup done for syncope   . 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 Syncopal episodes   . Tinnitus of both ears     Family History  Problem Relation Age of Onset  . Adopted: Yes  . Family history unknown: Yes    Past Surgical History:  Procedure Laterality Date  . LUMBAR LAMINECTOMY/DECOMPRESSION MICRODISCECTOMY N/A 11/12/2016   Procedure: MICRODISCECTOMY RIGHT L5-S1;  Surgeon: Jessy Oto, MD;  Location: [facility];  Service: Orthopedics;  Laterality: N/A;  . NECK SURGERY    . none    . SUBMANDIBULAR GLAND EXCISION Right 25/63/8937   DR Erik Obey  . SUBMANDIBULAR GLAND EXCISION Right 07/12/2014   Procedure:  EXCISION RIGHT SUBMANDIBULAR GLAND;  Surgeon: [name], MD;  Location: [facility];  Service: ENT;  Laterality: Right;  . SUBMANDIBULAR GLAND EXCISION Right 08/12/2014   Procedure: Selected neck dissection;  Surgeon: [name], MD;  Location: [facility];  Service: ENT;  Laterality: Right;   Social History   Occupational History  . Not on file.   Social History Main Topics  . Smoking status: Former Smoker    Packs/day: 0.50    Years: 9.00    Quit date: 02/22/2011  . Smokeless tobacco: Former Systems developer    Types: Chew  . Alcohol use 0.0 oz/week     Comment: occas. beer  STOPPED ON 3/4  . Drug use: Yes    Types: Marijuana     Comment: once weekly---STOPPED IT 3/4  . Sexual activity: Not on file

## 2017-01-05 ENCOUNTER — Telehealth: Payer: Self-pay | Admitting: *Deleted

## 2017-01-05 NOTE — Telephone Encounter (Signed)
-----   Message from Heath Lark, MD sent at 01/05/2017  8:28 AM EDT ----- Regarding: status of [facility] referral Can you ask the patient whether he has heard back since his visit in [place]?

## 2017-01-05 NOTE — Telephone Encounter (Signed)
Lm to call Dr Alvy Bimler nurse.

## 2017-02-02 ENCOUNTER — Telehealth: Payer: Self-pay | Admitting: *Deleted

## 2017-02-02 NOTE — Telephone Encounter (Signed)
-----   Message from Heath Lark, MD sent at 02/02/2017  6:26 AM EDT ----- Regarding: f/up on consult from [facility] Can you and ask how he is doing? Any plans from [place]?

## 2017-02-02 NOTE — Telephone Encounter (Signed)
LM to call Dr Alvy Bimler office for follow up.

## 2017-02-02 NOTE — Telephone Encounter (Signed)
Pt spoke with scheduler. Will be here on 10/3 @ 3:00pm

## 2017-02-02 NOTE — Telephone Encounter (Signed)
LM to call regarding an appt on 10/3 @ 3:00 or ( 0830, 0900, 0930 if morning needed)  Requested return call to confirm.

## 2017-02-02 NOTE — Telephone Encounter (Signed)
Pt returned call. He was very frustrated, felt like he wasted his time going to [place]. Feels likes everyone does the same tests, waste of time and money. Was expecting to be scheduled for a biopsy at [facility] when he left [facility], but later called back to [facility] and was told he was supposed to arrange the biopsy himself. He did not know who to call at [facility], so he gave up.    RN called [facility], Dr Efraim Kaufmann NP Rudene Christians for an update

## 2017-02-06 ENCOUNTER — Other Ambulatory Visit: Payer: Self-pay | Admitting: Family Medicine

## 2017-02-06 DIAGNOSIS — F419 Anxiety disorder, unspecified: Secondary | ICD-10-CM

## 2017-02-06 DIAGNOSIS — F329 Major depressive disorder, single episode, unspecified: Secondary | ICD-10-CM

## 2017-02-06 DIAGNOSIS — F32A Depression, unspecified: Secondary | ICD-10-CM

## 2017-02-06 DIAGNOSIS — C080 Malignant neoplasm of submandibular gland: Secondary | ICD-10-CM

## 2017-02-06 DIAGNOSIS — M5416 Radiculopathy, lumbar region: Secondary | ICD-10-CM

## 2017-02-06 DIAGNOSIS — M5441 Lumbago with sciatica, right side: Secondary | ICD-10-CM

## 2017-02-07 ENCOUNTER — Other Ambulatory Visit: Payer: Self-pay | Admitting: Family Medicine

## 2017-02-07 DIAGNOSIS — F329 Major depressive disorder, single episode, unspecified: Secondary | ICD-10-CM

## 2017-02-07 DIAGNOSIS — F32A Depression, unspecified: Secondary | ICD-10-CM

## 2017-02-07 DIAGNOSIS — F419 Anxiety disorder, unspecified: Secondary | ICD-10-CM

## 2017-02-07 DIAGNOSIS — C080 Malignant neoplasm of submandibular gland: Secondary | ICD-10-CM

## 2017-02-07 DIAGNOSIS — M5441 Lumbago with sciatica, right side: Secondary | ICD-10-CM

## 2017-02-07 DIAGNOSIS — M5416 Radiculopathy, lumbar region: Secondary | ICD-10-CM

## 2017-02-07 NOTE — Telephone Encounter (Signed)
Last seen 10/06/16  Dr Dettinger

## 2017-02-09 ENCOUNTER — Ambulatory Visit (HOSPITAL_BASED_OUTPATIENT_CLINIC_OR_DEPARTMENT_OTHER): Payer: 59 | Admitting: Hematology and Oncology

## 2017-02-09 ENCOUNTER — Telehealth: Payer: Self-pay | Admitting: Hematology and Oncology

## 2017-02-09 VITALS — BP 124/78 | HR 58 | Temp 98.6°F | Resp 20 | Ht 72.0 in | Wt 200.9 lb

## 2017-02-09 DIAGNOSIS — C080 Malignant neoplasm of submandibular gland: Secondary | ICD-10-CM

## 2017-02-09 DIAGNOSIS — C7800 Secondary malignant neoplasm of unspecified lung: Secondary | ICD-10-CM | POA: Diagnosis not present

## 2017-02-09 MED ORDER — DULOXETINE HCL 60 MG PO CPEP: 60.0000 mg | ORAL_CAPSULE | Freq: Every day | ORAL | 0 refills | Status: DC

## 2017-02-09 NOTE — Telephone Encounter (Signed)
Gave patient AVS for 10/3 visit.

## 2017-02-10 ENCOUNTER — Telehealth: Payer: Self-pay

## 2017-02-10 NOTE — Telephone Encounter (Signed)
[name], [place] at [facility] called back returning a phone call from [place], [place]. Will fax over notes from visit on 6-5.

## 2017-02-11 ENCOUNTER — Encounter: Payer: Self-pay | Admitting: Hematology and Oncology

## 2017-02-11 NOTE — Assessment & Plan Note (Signed)
He is not symptomatic from lung disease The location and the size of lung metastases are difficult to biopsy We discussed potential use of foundation one testing I plan to order imaging study and we will regroup next week to discuss plan of care He agreed

## 2017-02-11 NOTE — Progress Notes (Signed)
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 No acute intracranial abnormality. 2. Swollen right submandibular gland containing prominent calcifications consistent with submandibular gland stones. Submandibular gland enlargement is most likely inflammatory. Malignancy cannot be entirely excluded .      07/12/2014 Pathology Results    Submandibular gland, Right - ADENOID CYSTIC CARCINOMA, GRADE I OF III, SEE COMMENT. - POSITIVE FOR PERINEURAL INVASION. - TUMOR INVOLVES SURGICAL EXCISIONAL MARGIN - SEE TUMOR SYNOPTIC TEMPLATE BELOW. Microscopic Comment ONCOLOGY TABLE - SALIVARY GLAND 1. Specimen: Right submandibular gland 2. Procedure: Excision. 3. Tumor site and laterality: Right submandibular gland 4. Tumor focality: Unifocal 5. Maximum tumor size (cm): 3.8 cm 6. Histologic type: Adenoid cystic carcinoma. 7. Grade: I of III 8. Margins: Positive Distance of tumor from closest margin: Present at margin 9. Perineural invasion: Present 10. Lymph-Vascular invasion: Absent. 11. Lymph nodes: # examined - 0; # positive - N/A; extracapsular extension - N/A 12. TNM code: pT2, pNX, pMX      07/12/2014 Surgery    Right submandibular gland excision       07/24/2014 Miscellaneous    The patient was seen by radiation oncologist locally who offered him adjuvant radiation treatment.  The patient obtained second opinion at [facility] who recommended against adjuvant radiation treatment.  The patient finally decided not proceed with radiation therapy      07/29/2014 Imaging    No findings specific for metastatic disease in the chest or abdomen. 4 mm subpleural nodule in the medial left lower lobe, likely reflecting a benign subpleural lymph node, although technically  indeterminate. Attention on follow-up is suggested.       07/30/2014 Imaging    MR face: Postop resection of right submandibular gland for adenoid cystic carcinoma. No evidence of perineural spread. No skullbase or cavernous sinus involvement.       08/12/2014 Surgery    Right level I, 2 neck selective dissection       08/12/2014 Pathology Results    Lymph nodes, radical neck dissection, limited right neck - BENIGN SALIVARY GLAND TISSUE; NEGATIVE FOR ATYPIA OR MALIGNANCY. - BENIGN SKIN WITH DERMAL AND SUBCUTANEOUS SCAR AND INFLAMMATORY TISSUE REACTION. - TWO INTRA-SALIVARY GLAND LYMPH NODES, NEGATIVE FOR TUMOR (0/2) - ONE LEVEL ONE-A LYMPH NODE, NEGATIVE FOR TUMOR (0/1). - SIX LEVEL TWO LYMPH NODES, NEGATIVE FOR TUMOR (0/6)      04/07/2015 Imaging    MR neck: 1. Status post resection of right submandibular gland. 2. No evidence for perineural spread or tumor recurrence. 3. Stable central and bilateral foraminal narrowing at C6-7.      09/25/2015 Imaging    MR neck: Stable exam. No evidence of residual or recurrent right submandibular carcinoma.      07/08/2016 Procedure    He underwent repeat excision of right submandibular mass: A 2 x 3 cm ellipse was made around mass incorporating the scar. This was carried down through skin and subcutaneous fat and the dissection was completed at the level of the sternomastoid muscle. Examination of the specimen revealed complete removal. This was sent for pathologic interpretation.           07/09/2016 Pathology Results    Adenoid cystic carcinoma (margins free of neoplasm)      08/06/2016 Imaging    CT  scan of chest, abdomen and pelvis: 1. There are now multiple pulmonary nodules consistent with metastatic disease to the lungs. One nodule was present previously, measuring 4 mm, noted along the pleural margin of the left lower lobe. This is most likely a benign granuloma. The remaining visualized nodules are new consistent with metastatic  disease, given the patient's history. 2. No other findings of metastatic disease within the chest. 3. No evidence of metastatic disease in the abdomen or pelvis. 4. No acute findings.      08/06/2016 Imaging    Ct neck: 1. Multiple new bilateral apical pulmonary nodules measuring up to 1.3 cm and concerning for metastatic disease. 2. 22 x 7 mm right level III lymph node, enlarged from 2016 and indeterminate. 3. Prior right submandibular gland resection without evidence of locally recurrent mass.      08/06/2016 Imaging    MR face: Prior right submandibular gland resection without evidence of recurrent tumor      10/12/2016 Imaging    There is no new mediastinal or hilar lymphadenopathy.  The heart is normal in size and there is no pericardial or pleural effusion.  Bilateral pulmonary nodules have slightly increased in size, suspicious for metastatic disease. For instance a 14 mm nodule on image 39 previously measured 12 mm. The largest nodule in the right lower lobe measures 24 mm in diameter.  Limited imaging through the upper abdomen shows normal adrenal glands.      10/12/2016 Imaging    CT neck 1.No evidence for primary or nodal recurrence in the neck. 2.Bilateral pulmonary nodules.       INTERVAL HISTORY: Please see below for problem oriented charting. The patient returns for further follow-up He was greatly disappointed with his experience at [facility] in [place] The patient felt that he would never recommend anybody to go there They told him they would arrange for biopsy but that was not done The patient felt that it was a waste of his money and time In the meantime, he is mildly symptomatic in the right jaw/parotid region It is somewhat tight when he tries to open his mouth wide but he denies pain or difficulties with swallowing Denies cough, chest pain or shortness of breath He denies new lymphadenopathy in his neck  REVIEW OF SYSTEMS:   Constitutional: Denies fevers,  chills or abnormal weight loss Eyes: Denies blurriness of vision Ears, nose, mouth, throat, and face: Denies mucositis or sore throat Respiratory: Denies cough, dyspnea or wheezes Cardiovascular: Denies palpitation, chest discomfort or lower extremity swelling Gastrointestinal:  Denies nausea, heartburn or change in bowel habits Skin: Denies abnormal skin rashes Lymphatics: Denies new lymphadenopathy or easy bruising Neurological:Denies numbness, tingling or new weaknesses Behavioral/Psych: Mood is stable, no new changes  All other systems were reviewed with the patient and are negative.  I have reviewed the past medical history, past surgical history, social history and family history with the patient and they are unchanged from previous note.  ALLERGIES:  is allergic to hydrocodone.  MEDICATIONS:  Current Outpatient Prescriptions  Medication Sig Dispense Refill  . DULoxetine (CYMBALTA) 60 MG capsule Take 1 capsule (60 mg total) by mouth daily. 30 capsule 0  . fexofenadine (ALLEGRA) 180 MG tablet Take 180 mg by mouth daily as needed for allergies. OTC    . loratadine (CLARITIN) 10 MG tablet Take 10 mg by mouth daily as needed for allergies.      No current facility-administered medications for this visit.     PHYSICAL EXAMINATION:  ECOG PERFORMANCE STATUS: 1 - Symptomatic but completely ambulatory  Vitals:   02/09/17 1501  BP: 124/78  Pulse: (!) 58  Resp: 20  Temp: 98.6 F (37 C)  SpO2: 100%   Filed Weights   02/09/17 1501  Weight: 200 lb 14.4 oz (91.1 kg)    GENERAL:alert, no distress and comfortable SKIN: skin color, texture, turgor are normal, no rashes or significant lesions EYES: normal, Conjunctiva are pink and non-injected, sclera clear OROPHARYNX:no exudate, no erythema and lips, buccal mucosa, and tongue normal  NECK: Noted well-healed surgical scar over the right parotid region  lYMPH:  no palpable lymphadenopathy in the cervical, axillary or inguinal LUNGS:  clear to auscultation and percussion with normal breathing effort HEART: regular rate & rhythm and no murmurs and no lower extremity edema ABDOMEN:abdomen soft, non-tender and normal bowel sounds Musculoskeletal:no cyanosis of digits and no clubbing  NEURO: alert & oriented x 3 with fluent speech, no focal motor/sensory deficits  LABORATORY DATA:  I have reviewed the data as listed    Component Value Date/Time   NA 140 11/11/2016 1426   K 3.7 11/11/2016 1426   CL 107 11/11/2016 1426   CO2 25 11/11/2016 1426   GLUCOSE 86 11/11/2016 1426   BUN 22 (H) 11/11/2016 1426   CREATININE 1.01 11/11/2016 1426   CALCIUM 9.2 11/11/2016 1426   PROT 6.4 (L) 11/11/2016 1426   ALBUMIN 4.1 11/11/2016 1426   AST 30 11/11/2016 1426   ALT 24 11/11/2016 1426   ALKPHOS 58 11/11/2016 1426   BILITOT 0.6 11/11/2016 1426   GFRNONAA >60 11/11/2016 1426   GFRAA >60 11/11/2016 1426    No results found for: SPEP, UPEP  Lab Results  Component Value Date   WBC 7.9 11/11/2016   NEUTROABS 4.3 02/19/2014   HGB 12.6 (L) 11/11/2016   HCT 38.4 (L) 11/11/2016   MCV 86.7 11/11/2016   PLT 196 11/11/2016      Chemistry      Component Value Date/Time   NA 140 11/11/2016 1426   K 3.7 11/11/2016 1426   CL 107 11/11/2016 1426   CO2 25 11/11/2016 1426   BUN 22 (H) 11/11/2016 1426   CREATININE 1.01 11/11/2016 1426      Component Value Date/Time   CALCIUM 9.2 11/11/2016 1426   ALKPHOS 58 11/11/2016 1426   AST 30 11/11/2016 1426   ALT 24 11/11/2016 1426   BILITOT 0.6 11/11/2016 1426      ASSESSMENT & PLAN:  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[facility]) The patient continues to experience intermittent throbbing sensation over the right parotid region He felt that his jaw felt tight when he tries to open his mouth but able to eat well without recent weight loss He has indicated in the past that he is not enthusiastic to pursue local therapy in the setting of metastatic disease I recommend repeat CT  scan of the neck and chest for further evaluation and he agreed to proceed  Metastasis to lung [facility]) He is not symptomatic from lung disease The location and the size of lung metastases are difficult to biopsy We discussed potential use of foundation one testing I plan to order imaging study and we will regroup next week to discuss plan of care He agreed   Orders Placed This Encounter  Procedures  . CT CHEST W CONTRAST    Standing Status:   Future    Standing Expiration Date:   02/09/2018    Order Specific Question:   If  indicated for the ordered procedure, I authorize the administration of contrast media per Radiology protocol    Answer:   Yes    Order Specific Question:   Preferred imaging location?    Answer:   [facility]    Order Specific Question:   Radiology Contrast Protocol - do NOT remove file path    Answer:   \\[email or web address]  . CT Soft Tissue Neck W Contrast    Standing Status:   Future    Standing Expiration Date:   02/09/2018    Order Specific Question:   If indicated for the ordered procedure, I authorize the administration of contrast media per Radiology protocol    Answer:   Yes    Order Specific Question:   Preferred imaging location?    Answer:   [facility]    Order Specific Question:   Radiology Contrast Protocol - do NOT remove file path    Answer:   \\[email or web address]   All questions were answered. The patient knows to call the clinic with any problems, questions or concerns. No barriers to learning was detected. I spent 20 minutes counseling the patient face to face. The total time spent in the appointment was 30 minutes and more than 50% was on counseling and review of test results     Heath Lark, MD 02/11/2017 1:52 PM

## 2017-02-11 NOTE — Assessment & Plan Note (Signed)
The patient continues to experience intermittent throbbing sensation over the right parotid region He felt that his jaw felt tight when he tries to open his mouth but able to eat well without recent weight loss He has indicated in the past that he is not enthusiastic to pursue local therapy in the setting of metastatic disease I recommend repeat CT scan of the neck and chest for further evaluation and he agreed to proceed

## 2017-02-16 ENCOUNTER — Encounter (HOSPITAL_COMMUNITY): Payer: Self-pay

## 2017-02-16 ENCOUNTER — Ambulatory Visit (HOSPITAL_COMMUNITY)
Admission: RE | Admit: 2017-02-16 | Discharge: 2017-02-16 | Disposition: A | Discharge status: Home / Self Care | Payer: 59 | Source: Ambulatory Visit | Attending: Hematology and Oncology | Admitting: Hematology and Oncology

## 2017-02-16 DIAGNOSIS — C080 Malignant neoplasm of submandibular gland: Secondary | ICD-10-CM | POA: Insufficient documentation

## 2017-02-16 DIAGNOSIS — C7800 Secondary malignant neoplasm of unspecified lung: Secondary | ICD-10-CM | POA: Diagnosis not present

## 2017-02-16 MED ORDER — IOPAMIDOL (ISOVUE-300) INJECTION 61%
INTRAVENOUS | Status: AC
  Administered 2017-02-16: 75 mL via INTRAVENOUS
  Filled 2017-02-16: qty 75

## 2017-02-16 MED ORDER — IOPAMIDOL (ISOVUE-300) INJECTION 61%
75.0000 mL | Freq: Once | INTRAVENOUS | Status: AC | PRN
  Administered 2017-02-16: 75 mL via INTRAVENOUS

## 2017-02-16 MED ORDER — IOPAMIDOL (ISOVUE-300) INJECTION 61%
INTRAVENOUS | Status: AC
  Filled 2017-02-16: qty 100

## 2017-02-17 ENCOUNTER — Ambulatory Visit (INDEPENDENT_AMBULATORY_CARE_PROVIDER_SITE_OTHER): Payer: 59 | Admitting: Specialist

## 2017-02-17 ENCOUNTER — Encounter (INDEPENDENT_AMBULATORY_CARE_PROVIDER_SITE_OTHER): Payer: Self-pay | Admitting: Specialist

## 2017-02-17 VITALS — BP 134/82 | HR 64 | Ht 71.0 in | Wt 200.0 lb

## 2017-02-17 DIAGNOSIS — Z9889 Other specified postprocedural states: Secondary | ICD-10-CM

## 2017-02-17 NOTE — Patient Instructions (Addendum)
Plan:Avoid frequent bending and stooping  No lifting greater than 10 lbs. May use ice or moist heat for pain. Weight loss is of benefit. Advil intermittantly.

## 2017-02-17 NOTE — Progress Notes (Signed)
Post-Op Visit Note   Patient: [name]           Date of Birth: 1975-11-25           MRN: [id number] Visit Date: 02/17/2017 PCP: Dettinger, Fransisca Kaufmann, MD   Assessment & Plan:3 month follow up right L5-S1 microdiscectomy.  Chief Complaint:  Chief Complaint  Patient presents with  . Lower Back - Follow-up, Routine Post Op  Incision is healed no fluctuance, no erythrema. SLR is negative right ankle jerk 1+ and left is 2+ Visit Diagnoses: No diagnosis found.  Plan:Avoid frequent bending and stooping  No lifting greater than 10 lbs. May use ice or moist heat for pain. Weight loss is of benefit.     Follow-Up Instructions: No Follow-up on file.   Orders:  No orders of the defined types were placed in this encounter.  No orders of the defined types were placed in this encounter.   Imaging: Ct Soft Tissue Neck W Contrast  Result Date: 02/16/2017 CLINICAL DATA:  Submandibular gland adenoid cystic carcinoma EXAM: CT NECK WITH CONTRAST TECHNIQUE: Multidetector CT imaging of the neck was performed using the standard protocol following the bolus administration of intravenous contrast. CONTRAST:  75 mL Isovue-300 COMPARISON:  CT neck 08/06/2016 FINDINGS: Pharynx and larynx: --Nasopharynx: Fossae of Rosenmuller are clear. Normal adenoid tonsils for age. --Oral cavity and oropharynx: The palatine and lingual tonsils are normal. The visible oral cavity and floor of mouth are normal. --Hypopharynx: Normal vallecula and pyriform sinuses. --Larynx: Normal epiglottis and pre-epiglottic space. Normal aryepiglottic and vocal folds. --Retropharyngeal space: No abscess, effusion or lymphadenopathy. Salivary glands: --Parotid: No mass lesion or inflammation. No sialolithiasis or ductal dilatation. --Submandibular: The right submandibular gland is surgically absent. No mass or other focal abnormality in the resection fossa. The left submandibular gland is normal. --Sublingual: Normal. No ranula or  other visible lesion of the base of tongue and floor of mouth. Thyroid: Normal. Lymph nodes: Unchanged right level 3 lymph node measuring 7 x 22 mm (axial image 63). Left level IIa node measuring 6 mm is unchanged. No new enlarged or abnormal density nodes. Vascular: Major cervical vessels are patent. Limited intracranial: Normal. Visualized orbits: Normal. Mastoids and visualized paranasal sinuses: No fluid levels or advanced mucosal thickening. No mastoid effusion. Skeleton: No bony spinal canal stenosis. No lytic or blastic lesions. Upper chest: Please see dedicated report for concomitant CT chest. Other: None. IMPRESSION: 1. Unchanged examination of the neck with stable size of right level 3 and left level IIa lymph nodes. 2. Please see dedicated report for concomitant chest CT for findings below thoracic inlet. Electronically Signed   By: Ulyses Jarred M.D.   On: 02/16/2017 14:33   Ct Chest W Contrast  Result Date: 02/16/2017 CLINICAL DATA:  Adenoid cystic carcinoma of the submandibular gland metastatic disease to the lungs. EXAM: CT CHEST WITH CONTRAST TECHNIQUE: Multidetector CT imaging of the chest was performed during intravenous contrast administration. CONTRAST:  75 cc Isovue-300. COMPARISON:  08/06/2016. FINDINGS: Cardiovascular: Vascular structures are unremarkable. Heart size normal. No pericardial effusion. Mediastinum/Nodes: Likely thymic rebound in the prevascular space. No pathologically enlarged mediastinal, hilar or axillary lymph nodes. Esophagus is grossly unremarkable. Lungs/Pleura: Multiple low-density parenchymal and subpleural nodules have enlarged in the interval. Index left lower lobe nodule measures 1.4 cm (series 4, image 115), previously 11 mm when remeasured. No pleural fluid. Airway is unremarkable. Upper Abdomen: Visualized portions of the liver, gallbladder, adrenal gland, kidneys, spleen, pancreas, stomach and bowel are  grossly unremarkable with exception of a tiny hiatal  hernia. No upper abdominal adenopathy. Musculoskeletal: Mild degenerative changes in the spine. No worrisome lytic or sclerotic lesions. IMPRESSION: Enlarging pulmonary metastases. Electronically Signed   By: Lorin Picket M.D.   On: 02/16/2017 14:31    PMFS History: Patient Active Problem List   Diagnosis Date Noted  . Herniated intervertebral disc of lumbar spine 06/04/2016    Priority: High  . S/P lumbar laminectomy 11/12/2016  . Metastasis to lung ([place]) 08/26/2016  . 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 Syncope 02/21/2014  . Chest pain 02/21/2014   Past Medical History:  Diagnosis Date  . Arthritis   . Cancer ([place])   . Carcinoma ([place]) 07/2014   adenoid cystic  . Depression   . GERD (gastroesophageal reflux disease)    otc  . History of kidney stones   . History of stress test    workup done for syncope   . 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 Syncopal episodes   . Tinnitus of both ears     Family History  Problem Relation Age of Onset  . Adopted: Yes  . Family history unknown: Yes    Past Surgical History:  Procedure Laterality Date  . LUMBAR LAMINECTOMY/DECOMPRESSION MICRODISCECTOMY N/A 11/12/2016   Procedure: MICRODISCECTOMY RIGHT L5-S1;  Surgeon: Jessy Oto, MD;  Location: [facility];  Service: Orthopedics;  Laterality: N/A;  . NECK SURGERY    . none    . SUBMANDIBULAR GLAND EXCISION Right 02/28/1172   DR Erik Obey  . SUBMANDIBULAR GLAND EXCISION Right 07/12/2014   Procedure: EXCISION RIGHT SUBMANDIBULAR GLAND;  Surgeon: [name], MD;  Location: [facility];  Service: ENT;  Laterality: Right;  . SUBMANDIBULAR GLAND EXCISION Right 08/12/2014   Procedure: Selected neck dissection;  Surgeon: [name], MD;  Location: [place];  Service: ENT;  Laterality: Right;   Social History   Occupational History  . Not on file.   Social History Main Topics  . Smoking status: Former Smoker    Packs/day: 0.50     Years: 9.00    Quit date: 02/22/2011  . Smokeless tobacco: Former Systems developer    Types: Chew  . Alcohol use 0.0 oz/week     Comment: occas. beer  STOPPED ON 3/4  . Drug use: Yes    Types: Marijuana     Comment: once weekly---STOPPED IT 3/4  . Sexual activity: Not on file

## 2017-02-18 ENCOUNTER — Telehealth: Payer: Self-pay

## 2017-02-18 ENCOUNTER — Ambulatory Visit (HOSPITAL_BASED_OUTPATIENT_CLINIC_OR_DEPARTMENT_OTHER): Payer: 59 | Admitting: Hematology and Oncology

## 2017-02-18 ENCOUNTER — Encounter: Payer: Self-pay | Admitting: Hematology and Oncology

## 2017-02-18 VITALS — BP 126/80 | HR 70 | Temp 98.4°F | Resp 18 | Ht 71.0 in | Wt 207.4 lb

## 2017-02-18 DIAGNOSIS — C7800 Secondary malignant neoplasm of unspecified lung: Secondary | ICD-10-CM

## 2017-02-18 DIAGNOSIS — Z299 Encounter for prophylactic measures, unspecified: Secondary | ICD-10-CM | POA: Diagnosis not present

## 2017-02-18 DIAGNOSIS — C080 Malignant neoplasm of submandibular gland: Secondary | ICD-10-CM

## 2017-02-18 DIAGNOSIS — Z23 Encounter for immunization: Secondary | ICD-10-CM

## 2017-02-18 MED ORDER — INFLUENZA VAC SPLIT QUAD 0.5 ML IM SUSY
0.5000 mL | PREFILLED_SYRINGE | Freq: Once | INTRAMUSCULAR | Status: AC
  Administered 2017-02-18: 0.5 mL via INTRAMUSCULAR
  Filled 2017-02-18: qty 0.5

## 2017-02-18 NOTE — Assessment & Plan Note (Signed)
I reviewed the imaging studies with the patient's We discussed the risk and benefit of pursuing biopsy The patient is not symptomatic I am concerned about risk of biopsy I recommend sending his tissue sample from 2016 for additional testing for HER-2 receptor and foundation one testing I will call the patient after test results are available to determine the next step

## 2017-02-18 NOTE — Progress Notes (Signed)
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 No acute intracranial abnormality. 2. Swollen right submandibular gland containing prominent calcifications consistent with submandibular gland stones. Submandibular gland enlargement is most likely inflammatory. Malignancy cannot be entirely excluded .      07/12/2014 Pathology Results    Submandibular gland, Right - ADENOID CYSTIC CARCINOMA, GRADE I OF III, SEE COMMENT. - POSITIVE FOR PERINEURAL INVASION. - TUMOR INVOLVES SURGICAL EXCISIONAL MARGIN - SEE TUMOR SYNOPTIC TEMPLATE BELOW. Microscopic Comment ONCOLOGY TABLE - SALIVARY GLAND 1. Specimen: Right submandibular gland 2. Procedure: Excision. 3. Tumor site and laterality: Right submandibular gland 4. Tumor focality: Unifocal 5. Maximum tumor size (cm): 3.8 cm 6. Histologic type: Adenoid cystic carcinoma. 7. Grade: I of III 8. Margins: Positive Distance of tumor from closest margin: Present at margin 9. Perineural invasion: Present 10. Lymph-Vascular invasion: Absent. 11. Lymph nodes: # examined - 0; # positive - N/A; extracapsular extension - N/A 12. TNM code: pT2, pNX, pMX      07/12/2014 Surgery    Right submandibular gland excision       07/24/2014 Miscellaneous    The patient was seen by radiation oncologist locally who offered him adjuvant radiation treatment.  The patient obtained second opinion at [facility] who recommended against adjuvant radiation treatment.  The patient finally decided not proceed with radiation therapy      07/29/2014 Imaging    No findings specific for metastatic disease in the chest or abdomen. 4 mm subpleural nodule in the medial left lower lobe, likely reflecting a benign subpleural lymph node, although technically  indeterminate. Attention on follow-up is suggested.       07/30/2014 Imaging    MR face: Postop resection of right submandibular gland for adenoid cystic carcinoma. No evidence of perineural spread. No skullbase or cavernous sinus involvement.       08/12/2014 Surgery    Right level I, 2 neck selective dissection       08/12/2014 Pathology Results    Lymph nodes, radical neck dissection, limited right neck - BENIGN SALIVARY GLAND TISSUE; NEGATIVE FOR ATYPIA OR MALIGNANCY. - BENIGN SKIN WITH DERMAL AND SUBCUTANEOUS SCAR AND INFLAMMATORY TISSUE REACTION. - TWO INTRA-SALIVARY GLAND LYMPH NODES, NEGATIVE FOR TUMOR (0/2) - ONE LEVEL ONE-A LYMPH NODE, NEGATIVE FOR TUMOR (0/1). - SIX LEVEL TWO LYMPH NODES, NEGATIVE FOR TUMOR (0/6)      04/07/2015 Imaging    MR neck: 1. Status post resection of right submandibular gland. 2. No evidence for perineural spread or tumor recurrence. 3. Stable central and bilateral foraminal narrowing at C6-7.      09/25/2015 Imaging    MR neck: Stable exam. No evidence of residual or recurrent right submandibular carcinoma.      07/08/2016 Procedure    He underwent repeat excision of right submandibular mass: A 2 x 3 cm ellipse was made around mass incorporating the scar. This was carried down through skin and subcutaneous fat and the dissection was completed at the level of the sternomastoid muscle. Examination of the specimen revealed complete removal. This was sent for pathologic interpretation.           07/09/2016 Pathology Results    Adenoid cystic carcinoma (margins free of neoplasm)      08/06/2016 Imaging    CT  scan of chest, abdomen and pelvis: 1. There are now multiple pulmonary nodules consistent with metastatic disease to the lungs. One nodule was present previously, measuring 4 mm, noted along the pleural margin of the left lower lobe. This is most likely a benign granuloma. The remaining visualized nodules are new consistent with metastatic  disease, given the patient's history. 2. No other findings of metastatic disease within the chest. 3. No evidence of metastatic disease in the abdomen or pelvis. 4. No acute findings.      08/06/2016 Imaging    Ct neck: 1. Multiple new bilateral apical pulmonary nodules measuring up to 1.3 cm and concerning for metastatic disease. 2. 22 x 7 mm right level III lymph node, enlarged from 2016 and indeterminate. 3. Prior right submandibular gland resection without evidence of locally recurrent mass.      08/06/2016 Imaging    MR face: Prior right submandibular gland resection without evidence of recurrent tumor      10/12/2016 Imaging    There is no new mediastinal or hilar lymphadenopathy.  The heart is normal in size and there is no pericardial or pleural effusion.  Bilateral pulmonary nodules have slightly increased in size, suspicious for metastatic disease. For instance a 14 mm nodule on image 39 previously measured 12 mm. The largest nodule in the right lower lobe measures 24 mm in diameter.  Limited imaging through the upper abdomen shows normal adrenal glands.      10/12/2016 Imaging    CT neck 1.No evidence for primary or nodal recurrence in the neck. 2.Bilateral pulmonary nodules.      02/16/2017 Imaging    Ct chest showed enlarging pulmonary metastases.      02/16/2017 Imaging    1. Unchanged examination of the neck with stable size of right level 3 and left level IIa lymph nodes. 2. Please see dedicated report for concomitant chest CT for findings below thoracic inlet.       INTERVAL HISTORY: Please see below for problem oriented charting. He returns for further follow-up He is feeling well He denies recent symptoms such as cough, chest pain or shortness of breath  REVIEW OF SYSTEMS:   Constitutional: Denies fevers, chills or abnormal weight loss Eyes: Denies blurriness of vision Ears, nose, mouth, throat, and face: Denies mucositis or sore throat Respiratory:  Denies cough, dyspnea or wheezes Cardiovascular: Denies palpitation, chest discomfort or lower extremity swelling Gastrointestinal:  Denies nausea, heartburn or change in bowel habits Skin: Denies abnormal skin rashes Lymphatics: Denies new lymphadenopathy or easy bruising Neurological:Denies numbness, tingling or new weaknesses Behavioral/Psych: Mood is stable, no new changes  All other systems were reviewed with the patient and are negative.  I have reviewed the past medical history, past surgical history, social history and family history with the patient and they are unchanged from previous note.  ALLERGIES:  is allergic to hydrocodone.  MEDICATIONS:  Current Outpatient Prescriptions  Medication Sig Dispense Refill  . DULoxetine (CYMBALTA) 60 MG capsule Take 1 capsule (60 mg total) by mouth daily. 30 capsule 0  . fexofenadine (ALLEGRA) 180 MG tablet Take 180 mg by mouth daily as needed for allergies. OTC    . loratadine (CLARITIN) 10 MG tablet Take 10 mg by mouth daily as needed for allergies.      No current facility-administered medications for this visit.     PHYSICAL EXAMINATION: ECOG PERFORMANCE STATUS: 0 - Asymptomatic  Vitals:   02/18/17 1330  BP: 126/80  Pulse: 70  Resp: 18  Temp: 98.4 F (36.9 C)  SpO2: 100%   Filed Weights   02/18/17 1330  Weight: 207 lb 6.4 oz (94.1 kg)    GENERAL:alert, no distress and comfortable SKIN: skin color, texture, turgor are normal, no rashes or significant lesions EYES: normal, Conjunctiva are pink and non-injected, sclera clear OROPHARYNX:no exudate, no erythema and lips, buccal mucosa, and tongue normal  NECK: supple, thyroid normal size, non-tender, without nodularity LYMPH:  no palpable lymphadenopathy in the cervical, axillary or inguinal LUNGS: clear to auscultation and percussion with normal breathing effort HEART: regular rate & rhythm and no murmurs and no lower extremity edema ABDOMEN:abdomen soft, non-tender and  normal bowel sounds Musculoskeletal:no cyanosis of digits and no clubbing  NEURO: alert & oriented x 3 with fluent speech, no focal motor/sensory deficits  LABORATORY DATA:  I have reviewed the data as listed    Component Value Date/Time   NA 140 11/11/2016 1426   K 3.7 11/11/2016 1426   CL 107 11/11/2016 1426   CO2 25 11/11/2016 1426   GLUCOSE 86 11/11/2016 1426   BUN 22 (H) 11/11/2016 1426   CREATININE 1.01 11/11/2016 1426   CALCIUM 9.2 11/11/2016 1426   PROT 6.4 (L) 11/11/2016 1426   ALBUMIN 4.1 11/11/2016 1426   AST 30 11/11/2016 1426   ALT 24 11/11/2016 1426   ALKPHOS 58 11/11/2016 1426   BILITOT 0.6 11/11/2016 1426   GFRNONAA >60 11/11/2016 1426   GFRAA >60 11/11/2016 1426    No results found for: SPEP, UPEP  Lab Results  Component Value Date   WBC 7.9 11/11/2016   NEUTROABS 4.3 02/19/2014   HGB 12.6 (L) 11/11/2016   HCT 38.4 (L) 11/11/2016   MCV 86.7 11/11/2016   PLT 196 11/11/2016      Chemistry      Component Value Date/Time   NA 140 11/11/2016 1426   K 3.7 11/11/2016 1426   CL 107 11/11/2016 1426   CO2 25 11/11/2016 1426   BUN 22 (H) 11/11/2016 1426   CREATININE 1.01 11/11/2016 1426      Component Value Date/Time   CALCIUM 9.2 11/11/2016 1426   ALKPHOS 58 11/11/2016 1426   AST 30 11/11/2016 1426   ALT 24 11/11/2016 1426   BILITOT 0.6 11/11/2016 1426       RADIOGRAPHIC STUDIES: I have reviewed imaging study with the patient and his wife I have personally reviewed the radiological images as listed and agreed with the findings in the report. Ct Soft Tissue Neck W Contrast  Result Date: 02/16/2017 CLINICAL DATA:  Submandibular gland adenoid cystic carcinoma EXAM: CT NECK WITH CONTRAST TECHNIQUE: Multidetector CT imaging of the neck was performed using the standard protocol following the bolus administration of intravenous contrast. CONTRAST:  75 mL Isovue-300 COMPARISON:  CT neck 08/06/2016 FINDINGS: Pharynx and larynx: --Nasopharynx: Fossae of  Rosenmuller are clear. Normal adenoid tonsils for age. --Oral cavity and oropharynx: The palatine and lingual tonsils are normal. The visible oral cavity and floor of mouth are normal. --Hypopharynx: Normal vallecula and pyriform sinuses. --Larynx: Normal epiglottis and pre-epiglottic space. Normal aryepiglottic and vocal folds. --Retropharyngeal space: No abscess, effusion or lymphadenopathy. Salivary glands: --Parotid: No mass lesion or inflammation. No sialolithiasis or ductal dilatation. --Submandibular: The right submandibular gland is surgically absent. No mass or other focal abnormality in the resection fossa. The left submandibular gland is normal. --Sublingual: Normal. No ranula or other visible lesion of the base of tongue and floor of mouth. Thyroid: Normal. Lymph nodes: Unchanged right level 3  lymph node measuring 7 x 22 mm (axial image 63). Left level IIa node measuring 6 mm is unchanged. No new enlarged or abnormal density nodes. Vascular: Major cervical vessels are patent. Limited intracranial: Normal. Visualized orbits: Normal. Mastoids and visualized paranasal sinuses: No fluid levels or advanced mucosal thickening. No mastoid effusion. Skeleton: No bony spinal canal stenosis. No lytic or blastic lesions. Upper chest: Please see dedicated report for concomitant CT chest. Other: None. IMPRESSION: 1. Unchanged examination of the neck with stable size of right level 3 and left level IIa lymph nodes. 2. Please see dedicated report for concomitant chest CT for findings below thoracic inlet. Electronically Signed   By: Ulyses Jarred M.D.   On: 02/16/2017 14:33   Ct Chest W Contrast  Result Date: 02/16/2017 CLINICAL DATA:  Adenoid cystic carcinoma of the submandibular gland metastatic disease to the lungs. EXAM: CT CHEST WITH CONTRAST TECHNIQUE: Multidetector CT imaging of the chest was performed during intravenous contrast administration. CONTRAST:  75 cc Isovue-300. COMPARISON:  08/06/2016.  FINDINGS: Cardiovascular: Vascular structures are unremarkable. Heart size normal. No pericardial effusion. Mediastinum/Nodes: Likely thymic rebound in the prevascular space. No pathologically enlarged mediastinal, hilar or axillary lymph nodes. Esophagus is grossly unremarkable. Lungs/Pleura: Multiple low-density parenchymal and subpleural nodules have enlarged in the interval. Index left lower lobe nodule measures 1.4 cm (series 4, image 115), previously 11 mm when remeasured. No pleural fluid. Airway is unremarkable. Upper Abdomen: Visualized portions of the liver, gallbladder, adrenal gland, kidneys, spleen, pancreas, stomach and bowel are grossly unremarkable with exception of a tiny hiatal hernia. No upper abdominal adenopathy. Musculoskeletal: Mild degenerative changes in the spine. No worrisome lytic or sclerotic lesions. IMPRESSION: Enlarging pulmonary metastases. Electronically Signed   By: Lorin Picket M.D.   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[place]) I reviewed the imaging studies with the patient's We discussed the risk and benefit of pursuing biopsy The patient is not symptomatic I am concerned about risk of biopsy I recommend sending his tissue sample from 2016 for additional testing for HER-2 receptor and foundation one testing I will call the patient after test results are available to determine the next step  Metastasis to lung [facility]) He is not symptomatic We discussed the risk and benefit of biopsy versus surveillance If there is no actionable targeted mutation for treatments, due to lack of symptoms, I recommend repeat imaging study in 3 months He agreed with the plan of care  Preventive measure We discussed the importance of preventive care and reviewed the vaccination programs. He does not have any prior allergic reactions to influenza vaccination. He agrees to proceed with influenza vaccination today and we will  administer it today at the clinic.    No orders of the defined types were placed in this encounter.  All questions were answered. The patient knows to call the clinic with any problems, questions or concerns. No barriers to learning was detected. I spent 15 minutes counseling the patient face to face. The total time spent in the appointment was 20 minutes and more than 50% was on counseling and review of test results     Heath Lark, MD 02/18/2017 2:22 PM

## 2017-02-18 NOTE — Telephone Encounter (Signed)
Called and given below message.

## 2017-02-18 NOTE — Telephone Encounter (Signed)
-----   Message from Heath Lark, MD sent at 02/18/2017  2:18 PM EDT ----- Regarding: call pathologist to add testing Accession: [id number] He had surgery on 07/12/14  Please ask pathologist to add Her 2 neu and Foundation One testing on his pathology samples  Thanks

## 2017-02-18 NOTE — Assessment & Plan Note (Signed)
We discussed the importance of preventive care and reviewed the vaccination programs. He does not have any prior allergic reactions to influenza vaccination. He agrees to proceed with influenza vaccination today and we will administer it today at the clinic.  

## 2017-02-18 NOTE — Assessment & Plan Note (Signed)
He is not symptomatic We discussed the risk and benefit of biopsy versus surveillance If there is no actionable targeted mutation for treatments, due to lack of symptoms, I recommend repeat imaging study in 3 months He agreed with the plan of care

## 2017-03-15 ENCOUNTER — Telehealth: Payer: Self-pay | Admitting: *Deleted

## 2017-03-15 ENCOUNTER — Encounter (HOSPITAL_COMMUNITY): Payer: Self-pay

## 2017-03-15 NOTE — Telephone Encounter (Signed)
LM to call Dr Gorsuch' nurse 

## 2017-03-15 NOTE — Telephone Encounter (Signed)
-----   Message from Heath Lark, MD sent at 03/15/2017 11:16 AM EST ----- Regarding: tests are neg Pls let him know all biopsy report came back without specific target we can use Options: 1) Repeat biopsy 2) treat with conventional treatment ----- Message ----- From: Tomi Likens Sent: 03/15/2017   8:23 AM To: Heath Lark, MD

## 2017-03-16 ENCOUNTER — Telehealth: Payer: Self-pay

## 2017-03-16 NOTE — Telephone Encounter (Signed)
Patient called back and left message. Called back and given previous message from Dr. Alvy Bimler.  Regarding: tests are neg Pls let him know all biopsy report came back without specific target we can use Options: 1) Repeat biopsy 2) treat with conventional treatment  He would like to have a repeat Biopsy.

## 2017-03-17 ENCOUNTER — Other Ambulatory Visit: Payer: Self-pay | Admitting: Hematology and Oncology

## 2017-03-17 DIAGNOSIS — C7800 Secondary malignant neoplasm of unspecified lung: Secondary | ICD-10-CM

## 2017-03-17 NOTE — Telephone Encounter (Signed)
Called with below message. 

## 2017-03-17 NOTE — Telephone Encounter (Signed)
I will send consult for IR to see if radiologist can do CT biopsy

## 2017-03-24 ENCOUNTER — Other Ambulatory Visit: Payer: Self-pay | Admitting: Radiology

## 2017-03-24 ENCOUNTER — Other Ambulatory Visit: Payer: Self-pay | Admitting: Student

## 2017-03-25 ENCOUNTER — Encounter (HOSPITAL_COMMUNITY): Payer: Self-pay

## 2017-03-25 ENCOUNTER — Ambulatory Visit (HOSPITAL_COMMUNITY)
Admission: RE | Admit: 2017-03-25 | Discharge: 2017-03-25 | Disposition: A | Discharge status: Home / Self Care | Payer: 59 | Source: Ambulatory Visit | Attending: Hematology and Oncology | Admitting: Hematology and Oncology

## 2017-03-25 ENCOUNTER — Ambulatory Visit (HOSPITAL_COMMUNITY)
Admission: RE | Admit: 2017-03-25 | Discharge: 2017-03-25 | Disposition: A | Discharge status: Home / Self Care | Payer: 59 | Source: Ambulatory Visit | Attending: Interventional Radiology | Admitting: Interventional Radiology

## 2017-03-25 DIAGNOSIS — Z9889 Other specified postprocedural states: Secondary | ICD-10-CM | POA: Diagnosis present

## 2017-03-25 DIAGNOSIS — Z885 Allergy status to narcotic agent status: Secondary | ICD-10-CM | POA: Diagnosis not present

## 2017-03-25 DIAGNOSIS — Z87442 Personal history of urinary calculi: Secondary | ICD-10-CM | POA: Diagnosis not present

## 2017-03-25 DIAGNOSIS — C7802 Secondary malignant neoplasm of left lung: Secondary | ICD-10-CM | POA: Insufficient documentation

## 2017-03-25 DIAGNOSIS — F329 Major depressive disorder, single episode, unspecified: Secondary | ICD-10-CM | POA: Insufficient documentation

## 2017-03-25 DIAGNOSIS — Z87891 Personal history of nicotine dependence: Secondary | ICD-10-CM | POA: Diagnosis not present

## 2017-03-25 DIAGNOSIS — Z79899 Other long term (current) drug therapy: Secondary | ICD-10-CM | POA: Insufficient documentation

## 2017-03-25 DIAGNOSIS — Z85818 Personal history of malignant neoplasm of other sites of lip, oral cavity, and pharynx: Secondary | ICD-10-CM | POA: Insufficient documentation

## 2017-03-25 DIAGNOSIS — K219 Gastro-esophageal reflux disease without esophagitis: Secondary | ICD-10-CM | POA: Diagnosis not present

## 2017-03-25 DIAGNOSIS — Z791 Long term (current) use of non-steroidal anti-inflammatories (NSAID): Secondary | ICD-10-CM | POA: Diagnosis not present

## 2017-03-25 DIAGNOSIS — C7800 Secondary malignant neoplasm of unspecified lung: Secondary | ICD-10-CM

## 2017-03-25 LAB — CBC
HCT: 43.4 % (ref 39.0–52.0)
Hemoglobin: 14.3 g/dL (ref 13.0–17.0)
MCH: 28.2 pg (ref 26.0–34.0)
MCHC: 32.9 g/dL (ref 30.0–36.0)
MCV: 85.6 fL (ref 78.0–100.0)
Platelets: 204 10*3/uL (ref 150–400)
RBC: 5.07 MIL/uL (ref 4.22–5.81)
RDW: 13.3 % (ref 11.5–15.5)
WBC: 4.8 10*3/uL (ref 4.0–10.5)

## 2017-03-25 LAB — PROTIME-INR
INR: 0.91
PROTHROMBIN TIME: 12.2 s (ref 11.4–15.2)

## 2017-03-25 LAB — APTT: aPTT: 29 seconds (ref 24–36)

## 2017-03-25 MED ORDER — SODIUM CHLORIDE 0.9 % IV SOLN
INTRAVENOUS | Status: DC
Start: 2017-03-25 — End: 2017-03-26

## 2017-03-25 MED ORDER — LIDOCAINE-EPINEPHRINE 1 %-1:100000 IJ SOLN
INTRAMUSCULAR | Status: AC
  Filled 2017-03-25: qty 1

## 2017-03-25 MED ORDER — SODIUM CHLORIDE 0.9 % IV SOLN
INTRAVENOUS | Status: AC | PRN
  Administered 2017-03-25: 10 mL/h via INTRAVENOUS

## 2017-03-25 MED ORDER — MIDAZOLAM HCL 2 MG/2ML IJ SOLN
INTRAMUSCULAR | Status: AC
  Filled 2017-03-25: qty 4

## 2017-03-25 MED ORDER — FENTANYL CITRATE (PF) 100 MCG/2ML IJ SOLN
INTRAMUSCULAR | Status: AC | PRN
  Administered 2017-03-25 (×2): 25 ug via INTRAVENOUS
  Administered 2017-03-25: 50 ug via INTRAVENOUS

## 2017-03-25 MED ORDER — MIDAZOLAM HCL 2 MG/2ML IJ SOLN
INTRAMUSCULAR | Status: AC | PRN
  Administered 2017-03-25: 1 mg via INTRAVENOUS
  Administered 2017-03-25: 0.5 mg via INTRAVENOUS

## 2017-03-25 MED ORDER — FENTANYL CITRATE (PF) 100 MCG/2ML IJ SOLN
INTRAMUSCULAR | Status: AC
  Filled 2017-03-25: qty 4

## 2017-03-25 NOTE — Sedation Documentation (Signed)
Patient is resting comfortably. 

## 2017-03-25 NOTE — Discharge Instructions (Signed)
Needle Biopsy of the Lung, Care After °This sheet gives you information about how to care for yourself after your procedure. Your health care provider may also give you more specific instructions. If you have problems or questions, contact your health care provider. °What can I expect after the procedure? °After the procedure, it is common to have: °· Soreness, pain, and tenderness where a tissue sample was taken (biopsy site). °· A cough. °· A sore throat. ° °Follow these instructions at home: °Biopsy site care °· Follow instructions from your health care provider about when to remove the bandage that was placed on the biopsy site. °· Keep the bandage dry until it has been removed. °· Check your biopsy site every day for signs of infection. Check for: °? More redness, swelling, or pain. °? More fluid or blood. °? Warmth to the touch. °? Pus or a bad smell. °General instructions °· Rest as directed by your health care provider. Ask your health care provider what activities are safe for you. °· Do not take baths, swim, or use a hot tub until your health care provider approves. °· Take over-the-counter and prescription medicines only as told by your health care provider. °· If you have airplane travel scheduled, talk with your health care provider about when it is safe for you to travel by airplane. °· It is up to you to get the results of your procedure. Ask your health care provider, or the department that is doing the procedure, when your results will be ready. °· Keep all follow-up visits as told by your health care provider. This is important. °Contact a health care provider if: °· You have more redness, swelling, or pain around your biopsy site. °· You have more fluid or blood coming from your biopsy site. °· Your biopsy site feels warm to the touch. °· You have pus or a bad smell coming from your biopsy site. °· You have a fever. °· You have pain that does not get better with medicine. °Get help right away  if: °· You have problems breathing. °· You have chest pain. °· You cough up blood. °· You faint. °· You have a fast heart rate. °Summary °· After a needle biopsy of the lung, it is common to have a cough, a sore throat, or soreness, pain, and tenderness where a tissue sample was taken (biopsy site). °· You should check your biopsy area every day for signs of infection, including pus or a bad smell, warmth, more fluid or blood, or more redness, swelling, or pain. °· You should not take baths, swim, or use a hot tub until your health care provider approves. °· It is up to you to get the results of your procedure. Ask your health care provider, or the department that is doing the procedure, when your results will be ready. °This information is not intended to replace advice given to you by your health care provider. Make sure you discuss any questions you have with your health care provider. °Document Released: 02/21/2007 Document Revised: 03/17/2016 Document Reviewed: 03/17/2016 °Elsevier Interactive Patient Education © 2017 [employer] ° °Moderate Conscious Sedation, Adult, Care After °These instructions provide you with information about caring for yourself after your procedure. Your health care provider may also give you more specific instructions. Your treatment has been planned according to current medical practices, but problems sometimes occur. Call your health care provider if you have any problems or questions after your procedure. °What can I expect after the   procedure? °After your procedure, it is common: °· To feel sleepy for several hours. °· To feel clumsy and have poor balance for several hours. °· To have poor judgment for several hours. °· To vomit if you eat too soon. ° °Follow these instructions at home: °For at least 24 hours after the procedure: ° °· Do not: °? Participate in activities where you could fall or become injured. °? Drive. °? Use heavy machinery. °? Drink alcohol. °? Take sleeping  pills or medicines that cause drowsiness. °? Make important decisions or sign legal documents. °? Take care of children on your own. °· Rest. °Eating and drinking °· Follow the diet recommended by your health care provider. °· If you vomit: °? Drink water, juice, or soup when you can drink without vomiting. °? Make sure you have little or no nausea before eating solid foods. °General instructions °· Have a responsible adult stay with you until you are awake and alert. °· Take over-the-counter and prescription medicines only as told by your health care provider. °· If you smoke, do not smoke without supervision. °· Keep all follow-up visits as told by your health care provider. This is important. °Contact a health care provider if: °· You keep feeling nauseous or you keep vomiting. °· You feel light-headed. °· You develop a rash. °· You have a fever. °Get help right away if: °· You have trouble breathing. °This information is not intended to replace advice given to you by your health care provider. Make sure you discuss any questions you have with your health care provider. °Document Released: 02/14/2013 Document Revised: 09/29/2015 Document Reviewed: 08/16/2015 °Elsevier Interactive Patient Education © 2018 [employer] ° °

## 2017-03-25 NOTE — H&P (Signed)
Chief Complaint: Patient was seen in consultation today for left lung mass biopsy at the request of [place]  Referring Physician(s): Heath Lark  Supervising Physician: Sandi Mariscal  Patient Status: [facility] - Out-pt  History of Present Illness: [name] is a 41 y.o. male   Adenoid cystic carcinoma of submandibular gland dx 2015 Surgical removal 3/16 No radiation Neck dissection 08/2014; all negative Additional right submandibular surgical resection 07/2016: Adenoid cystic carcinoma (margins free of neoplasm)  CT 08/06/16: IMPRESSION: 1. There are now multiple pulmonary nodules consistent with metastatic disease to the lungs. One nodule was present previously, measuring 4 mm, noted along the pleural margin of the left lower lobe. This is most likely a benign granuloma. The remaining visualized nodules are new consistent with metastatic disease, given the patient's history. 2. No other findings of metastatic disease within the chest. 3. No evidence of metastatic disease in the abdomen or pelvis. 4. No acute findings  CT 02/16/17: IMPRESSION: Enlarging pulmonary metastases. Lungs/Pleura: Multiple low-density parenchymal and subpleural nodules have enlarged in the interval. Index left lower lobe nodule measures 1.4 cm (series 4, image 115), previously 11 mm when remeasured  Scheduled now for left lung mass biopsy   Past Medical History:  Diagnosis Date  . Arthritis   . Cancer ([place])   . Carcinoma ([place]) 07/2014   adenoid cystic  . Depression   . GERD (gastroesophageal reflux disease)    otc  . History of kidney stones   . History of stress test    workup done for syncope   . 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 Syncopal episodes   . Tinnitus of both ears     Past Surgical History:  Procedure Laterality Date  . EXCISION RIGHT SUBMANDIBULAR GLAND Right 07/12/2014   Performed by [name], MD at [place]  . MICRODISCECTOMY RIGHT L5-S1 N/A 11/12/2016   Performed  by Jessy Oto, MD at [place]  . NECK SURGERY    . none    . Selected neck dissection Right 08/12/2014   Performed by [name], MD at [place]  . SUBMANDIBULAR GLAND EXCISION Right 63/89/3734   DR Erik Obey    Allergies: Hydrocodone  Medications: Prior to Admission medications   Medication Sig Start Date End Date Taking? Authorizing Provider  DULoxetine (CYMBALTA) 60 MG capsule Take 1 capsule (60 mg total) by mouth daily. 02/09/17  Yes Dettinger, Fransisca Kaufmann, MD  famotidine (PEPCID AC) 10 MG tablet Take 10 mg daily as needed by mouth for heartburn or indigestion.   Yes [provider]  ibuprofen (ADVIL,MOTRIN) 200 MG tablet Take 200-800 mg 2 (two) times daily as needed by mouth for moderate pain.   Yes [provider]  loratadine (CLARITIN) 10 MG tablet Take 10 mg by mouth daily as needed for allergies.    Yes [provider]  Multiple Vitamin (MULTIVITAMIN WITH MINERALS) TABS tablet Take 1 tablet daily by mouth.   Yes [provider]  naproxen sodium (ALEVE) 220 MG tablet Take 440 mg daily as needed by mouth (pain).   Yes [provider]     Family History  Adopted: Yes  Family history unknown: Yes    Social History   Socioeconomic History  . Marital status: Legally Separated    Spouse name: None  . Number of children: 1  . Years of education: None  . Highest education level: None  Social Needs  . Financial resource strain: None  . Food insecurity - worry: None  .  Food insecurity - inability: None  . Transportation needs - medical: None  . Transportation needs - non-medical: None  Occupational History  . None  Tobacco Use  . Smoking status: Former Smoker    Packs/day: 0.50    Years: 9.00    Pack years: 4.50    Last attempt to quit: 02/22/2011    Years since quitting: 6.0  . Smokeless tobacco: Former Systems developer    Types: Chew  Substance and Sexual Activity  . Alcohol use: Yes    Alcohol/week: 0.0 oz    Comment: occas. beer   STOPPED ON 3/4  . Drug use: Yes    Types: Marijuana    Comment: once weekly---STOPPED IT 3/4  . Sexual activity: None  Other Topics Concern  . None  Social History Narrative   Separated from his wife. Lives with his son.  Education: high school and [employer].  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   Lives with Lavella Lemons    Review of Systems: A 12 point ROS discussed and pertinent positives are indicated in the HPI above.  All other systems are negative.  Review of Systems  Constitutional: Positive for fatigue. Negative for activity change and fever.  HENT: Positive for sore throat.   Respiratory: Negative for cough and shortness of breath.   Cardiovascular: Negative for chest pain.  Gastrointestinal: Negative for abdominal pain.  Neurological: Negative for weakness.  Psychiatric/Behavioral: Negative for behavioral problems and confusion.    Vital Signs: BP 117/80 (BP Location: Right Arm)   Pulse 62   Temp 97.7 F (36.5 C) (Oral)   Ht 6' (1.829 m)   Wt 210 lb (95.3 kg)   SpO2 99%   BMI 28.48 kg/m   Physical Exam  Constitutional: He is oriented to person, place, and time.  Cardiovascular: Normal rate, regular rhythm and normal heart sounds.  Pulmonary/Chest: Effort normal and breath sounds normal.  Abdominal: Soft. Bowel sounds are normal.  Musculoskeletal: Normal range of motion.  Neurological: He is alert and oriented to person, place, and time.  Skin: Skin is warm and dry.  Psychiatric: He has a normal mood and affect. His behavior is normal. Judgment and thought content normal.  Nursing note and vitals reviewed.   Imaging: No results found.  Labs:  CBC: Recent Labs    11/11/16 1426 03/25/17 0908  WBC 7.9 4.8  HGB 12.6* 14.3  HCT 38.4* 43.4  PLT 196 204    COAGS: Recent Labs    11/11/16 1426 03/25/17 0908  INR 1.00 0.91  APTT 26 29    BMP: Recent Labs    11/11/16 1426  NA 140  K 3.7  CL 107    CO2 25  GLUCOSE 86  BUN 22*  CALCIUM 9.2  CREATININE 1.01  GFRNONAA >60  GFRAA >60    LIVER FUNCTION TESTS: Recent Labs    11/11/16 1426  BILITOT 0.6  AST 30  ALT 24  ALKPHOS 58  PROT 6.4*  ALBUMIN 4.1    TUMOR MARKERS: No results for input(s): AFPTM, CEA, CA199, CHROMGRNA in the last 8760 hours.  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 All of the patient's questions were answered, patient is  agreeable to proceed. Consent signed and in chart.  Thank you for this interesting consult.  I greatly enjoyed meeting [name] and look forward to participating in their care.  A copy of this report was sent to the requesting provider on this date.  Electronically Signed: Lavonia Drafts, PA-C 03/25/2017, 10:17 AM   I spent a total of  30 Minutes   in face to face in clinical consultation, greater than 50% of which was counseling/coordinating care for left lung mass biopsy

## 2017-03-25 NOTE — Procedures (Signed)
Pre procedural Dx: Enlarging pulmonary nodules  Post procedural Dx: Same  Technically successful CT guided biopsy of indeterminate nodule within the left lower lobe.   EBL: None.   Complications: None immediate.   Ronny Bacon, MD Pager #: [phone]

## 2017-03-28 ENCOUNTER — Telehealth: Payer: Self-pay | Admitting: *Deleted

## 2017-03-28 NOTE — Telephone Encounter (Signed)
[facility] pathology notified of message below.   Pt notified that test came back positive for salivary gland cancer. Dr Alvy Bimler is adding more test to the sample. Verbalized understanding

## 2017-03-28 NOTE — Telephone Encounter (Signed)
-----  Message from Heath Lark, MD sent at 03/28/2017  1:40 PM EST ----- Regarding: additional tests Please call Dr. Tresa Moore to add these tests in the order of preference if she does not have enough sample  1) c-kit mutation 2) EGFR 3) PD-1/PDL-1  Please ask for an estimate how long it would take for all these tests to come back  Thanks

## 2017-04-06 ENCOUNTER — Encounter (HOSPITAL_COMMUNITY): Payer: Self-pay

## 2017-04-07 ENCOUNTER — Encounter (HOSPITAL_COMMUNITY): Payer: Self-pay

## 2017-04-08 ENCOUNTER — Telehealth: Payer: Self-pay

## 2017-04-08 NOTE — Telephone Encounter (Signed)
He will take scheduled appt, see below message. Scheduling message sent.

## 2017-04-08 NOTE — Telephone Encounter (Signed)
Called and left message. Ask him to call nurse back, can he come in 12/4 at 3:30 to see Dr. Alvy Bimler for discussion on chemo plan.

## 2017-04-10 ENCOUNTER — Other Ambulatory Visit: Payer: Self-pay | Admitting: Family Medicine

## 2017-04-10 DIAGNOSIS — M5416 Radiculopathy, lumbar region: Secondary | ICD-10-CM

## 2017-04-10 DIAGNOSIS — C080 Malignant neoplasm of submandibular gland: Secondary | ICD-10-CM

## 2017-04-10 DIAGNOSIS — F329 Major depressive disorder, single episode, unspecified: Secondary | ICD-10-CM

## 2017-04-10 DIAGNOSIS — M5441 Lumbago with sciatica, right side: Secondary | ICD-10-CM

## 2017-04-10 DIAGNOSIS — F32A Depression, unspecified: Secondary | ICD-10-CM

## 2017-04-10 DIAGNOSIS — F419 Anxiety disorder, unspecified: Secondary | ICD-10-CM

## 2017-04-11 ENCOUNTER — Encounter: Payer: Self-pay | Admitting: Hematology and Oncology

## 2017-04-11 MED ORDER — DULOXETINE HCL 60 MG PO CPEP: 60.0000 mg | ORAL_CAPSULE | Freq: Every day | ORAL | 0 refills | Status: DC

## 2017-04-12 ENCOUNTER — Telehealth: Payer: Self-pay | Admitting: Hematology and Oncology

## 2017-04-12 ENCOUNTER — Ambulatory Visit (HOSPITAL_BASED_OUTPATIENT_CLINIC_OR_DEPARTMENT_OTHER): Payer: 59 | Admitting: Hematology and Oncology

## 2017-04-12 ENCOUNTER — Encounter: Payer: Self-pay | Admitting: Hematology and Oncology

## 2017-04-12 ENCOUNTER — Other Ambulatory Visit: Payer: Self-pay

## 2017-04-12 VITALS — BP 132/81 | HR 71 | Temp 98.5°F | Resp 18 | Ht 72.0 in | Wt 202.1 lb

## 2017-04-12 DIAGNOSIS — C080 Malignant neoplasm of submandibular gland: Secondary | ICD-10-CM | POA: Diagnosis not present

## 2017-04-12 DIAGNOSIS — C7800 Secondary malignant neoplasm of unspecified lung: Secondary | ICD-10-CM

## 2017-04-12 DIAGNOSIS — R0781 Pleurodynia: Secondary | ICD-10-CM

## 2017-04-12 NOTE — Assessment & Plan Note (Signed)
He complain of mild nonspecific chest wall pain in the right posterior region of his rib cage Examination is benign Due to presence of symptoms, I recommend repeat CT scan with contrast and he agreed

## 2017-04-12 NOTE — Assessment & Plan Note (Addendum)
Recent lung biopsy confirmed adenoid cystic carcinoma Unfortunately, due to scant tissue, it is not possible to add more testing PD-L1 is negative Prior salivary gland specimen in 2016 tested negative for HER-2/neu receptor I have requested additional testing on that tissue sample, to add c-kit mutation, EGFR as well as androgen receptors The patient is mildly symptomatic with nonspecific chest wall pain I recommend repeat CT scan of the chest next week for further assessment If target mutations are not able, recommendation would be conventional chemotherapy The combination chemotherapy using Cytoxan, Adriamycin and cisplatin could be useful versus single agent therapy I will discuss further plan of care with her and his girlfriend next week We briefly discussed expected risks of treatment including risk of nausea, vomiting, congestive heart failure, peripheral neuropathy and pancytopenia.

## 2017-04-12 NOTE — Progress Notes (Signed)
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 No acute intracranial abnormality. 2. Swollen right submandibular gland containing prominent calcifications consistent with submandibular gland stones. Submandibular gland enlargement is most likely inflammatory. Malignancy cannot be entirely excluded .      07/12/2014 Pathology Results    Submandibular gland, Right - ADENOID CYSTIC CARCINOMA, GRADE I OF III, SEE COMMENT. - POSITIVE FOR PERINEURAL INVASION. - TUMOR INVOLVES SURGICAL EXCISIONAL MARGIN - SEE TUMOR SYNOPTIC TEMPLATE BELOW. Microscopic Comment ONCOLOGY TABLE - SALIVARY GLAND 1. Specimen: Right submandibular gland 2. Procedure: Excision. 3. Tumor site and laterality: Right submandibular gland 4. Tumor focality: Unifocal 5. Maximum tumor size (cm): 3.8 cm 6. Histologic type: Adenoid cystic carcinoma. 7. Grade: I of III 8. Margins: Positive Distance of tumor from closest margin: Present at margin 9. Perineural invasion: Present 10. Lymph-Vascular invasion: Absent. 11. Lymph nodes: # examined - 0; # positive - N/A; extracapsular extension - N/A 12. TNM code: pT2, pNX, pMX      07/12/2014 Surgery    Right submandibular gland excision       07/24/2014 Miscellaneous    The patient was seen by radiation oncologist locally who offered him adjuvant radiation treatment.  The patient obtained second opinion at [facility] who recommended against adjuvant radiation treatment.  The patient finally decided not proceed with radiation therapy      07/29/2014 Imaging    No findings specific for metastatic disease in the chest or abdomen. 4 mm subpleural nodule in the medial left lower lobe, likely reflecting a benign subpleural lymph node, although technically  indeterminate. Attention on follow-up is suggested.       07/30/2014 Imaging    MR face: Postop resection of right submandibular gland for adenoid cystic carcinoma. No evidence of perineural spread. No skullbase or cavernous sinus involvement.       08/12/2014 Surgery    Right level I, 2 neck selective dissection       08/12/2014 Pathology Results    Lymph nodes, radical neck dissection, limited right neck - BENIGN SALIVARY GLAND TISSUE; NEGATIVE FOR ATYPIA OR MALIGNANCY. - BENIGN SKIN WITH DERMAL AND SUBCUTANEOUS SCAR AND INFLAMMATORY TISSUE REACTION. - TWO INTRA-SALIVARY GLAND LYMPH NODES, NEGATIVE FOR TUMOR (0/2) - ONE LEVEL ONE-A LYMPH NODE, NEGATIVE FOR TUMOR (0/1). - SIX LEVEL TWO LYMPH NODES, NEGATIVE FOR TUMOR (0/6)      04/07/2015 Imaging    MR neck: 1. Status post resection of right submandibular gland. 2. No evidence for perineural spread or tumor recurrence. 3. Stable central and bilateral foraminal narrowing at C6-7.      09/25/2015 Imaging    MR neck: Stable exam. No evidence of residual or recurrent right submandibular carcinoma.      07/08/2016 Procedure    He underwent repeat excision of right submandibular mass: A 2 x 3 cm ellipse was made around mass incorporating the scar. This was carried down through skin and subcutaneous fat and the dissection was completed at the level of the sternomastoid muscle. Examination of the specimen revealed complete removal. This was sent for pathologic interpretation.           07/09/2016 Pathology Results    Adenoid cystic carcinoma (margins free of neoplasm)      08/06/2016 Imaging    CT  scan of chest, abdomen and pelvis: 1. There are now multiple pulmonary nodules consistent with metastatic disease to the lungs. One nodule was present previously, measuring 4 mm, noted along the pleural margin of the left lower lobe. This is most likely a benign granuloma. The remaining visualized nodules are new consistent with metastatic  disease, given the patient's history. 2. No other findings of metastatic disease within the chest. 3. No evidence of metastatic disease in the abdomen or pelvis. 4. No acute findings.      08/06/2016 Imaging    Ct neck: 1. Multiple new bilateral apical pulmonary nodules measuring up to 1.3 cm and concerning for metastatic disease. 2. 22 x 7 mm right level III lymph node, enlarged from 2016 and indeterminate. 3. Prior right submandibular gland resection without evidence of locally recurrent mass.      08/06/2016 Imaging    MR face: Prior right submandibular gland resection without evidence of recurrent tumor      10/12/2016 Imaging    There is no new mediastinal or hilar lymphadenopathy.  The heart is normal in size and there is no pericardial or pleural effusion.  Bilateral pulmonary nodules have slightly increased in size, suspicious for metastatic disease. For instance a 14 mm nodule on image 39 previously measured 12 mm. The largest nodule in the right lower lobe measures 24 mm in diameter.  Limited imaging through the upper abdomen shows normal adrenal glands.      10/12/2016 Imaging    CT neck 1.No evidence for primary or nodal recurrence in the neck. 2.Bilateral pulmonary nodules.      02/16/2017 Imaging    Ct chest showed enlarging pulmonary metastases.      02/16/2017 Imaging    1. Unchanged examination of the neck with stable size of right level 3 and left level IIa lymph nodes. 2. Please see dedicated report for concomitant chest CT for findings below thoracic inlet.      03/25/2017 Procedure    Technically successful CT guided core needle core biopsy of indeterminate enlarging left lower lobe pulmonary nodule.      03/25/2017 Pathology Results    Lung, biopsy, Left lower lobe - FINDINGS CONSISTENT WITH METASTATIC ADENOID CYSTIC CARCINOMA, SEE COMMENT. Microscopic Comment There is a small focus of tumor with similar morphology to the patient's prior adenoid cystic  carcinoma of the submandibular gland ([id number], reviewed).        INTERVAL HISTORY: Please see below for problem oriented charting. He returns with his girlfriend for further follow-up They are planning to get married in March He complained of nonspecific chest wall discomfort on the right posterior region of his rib cage on deep inspiration His recent biopsy was on the left side He denies cough or shortness of breath No other new symptoms  REVIEW OF SYSTEMS:   Constitutional: Denies fevers, chills or abnormal weight loss Eyes: Denies blurriness of vision Ears, nose, mouth, throat, and face: Denies mucositis or sore throat Respiratory: Denies cough, dyspnea or wheezes Gastrointestinal:  Denies nausea, heartburn or change in bowel habits Skin: Denies abnormal skin rashes Lymphatics: Denies new lymphadenopathy or easy bruising Neurological:Denies numbness, tingling or new weaknesses Behavioral/Psych: Mood is stable, no new changes  All other systems were reviewed with the patient and are negative.  I have reviewed the past medical history, past surgical history, social history and family history with the patient and they are unchanged from previous note.  ALLERGIES:  is allergic to hydrocodone.  MEDICATIONS:  Current Outpatient Medications  Medication Sig Dispense Refill  . DULoxetine (CYMBALTA) 60 MG capsule Take 1 capsule (60 mg total) by mouth daily. 30 capsule 0  . famotidine (PEPCID AC) 10 MG tablet Take 10 mg daily as needed by mouth for heartburn or indigestion.    Marland Kitchen ibuprofen (ADVIL,MOTRIN) 200 MG tablet Take 200-800 mg 2 (two) times daily as needed by mouth for moderate pain.    Marland Kitchen loratadine (CLARITIN) 10 MG tablet Take 10 mg by mouth daily as needed for allergies.     . Multiple Vitamin (MULTIVITAMIN WITH MINERALS) TABS tablet Take 1 tablet daily by mouth.    . naproxen sodium (ALEVE) 220 MG tablet Take 440 mg daily as needed by mouth (pain).     No current  facility-administered medications for this visit.     PHYSICAL EXAMINATION: ECOG PERFORMANCE STATUS: 1 - Symptomatic but completely ambulatory  Vitals:   04/12/17 1500  BP: 132/81  Pulse: 71  Resp: 18  Temp: 98.5 F (36.9 C)  SpO2: 96%   Filed Weights   04/12/17 1500  Weight: 202 lb 1.6 oz (91.7 kg)    GENERAL:alert, no distress and comfortable SKIN: skin color, texture, turgor are normal, no rashes or significant lesions EYES: normal, Conjunctiva are pink and non-injected, sclera clear OROPHARYNX:no exudate, no erythema and lips, buccal mucosa, and tongue normal  NECK: supple, thyroid normal size, non-tender, without nodularity LYMPH:  no palpable lymphadenopathy in the cervical, axillary or inguinal LUNGS: clear to auscultation and percussion with normal breathing effort HEART: regular rate & rhythm and no murmurs and no lower extremity edema ABDOMEN:abdomen soft, non-tender and normal bowel sounds Musculoskeletal:no cyanosis of digits and no clubbing  NEURO: alert & oriented x 3 with fluent speech, no focal motor/sensory deficits  LABORATORY DATA:  I have reviewed the data as listed    Component Value Date/Time   NA 140 11/11/2016 1426   K 3.7 11/11/2016 1426   CL 107 11/11/2016 1426   CO2 25 11/11/2016 1426   GLUCOSE 86 11/11/2016 1426   BUN 22 (H) 11/11/2016 1426   CREATININE 1.01 11/11/2016 1426   CALCIUM 9.2 11/11/2016 1426   PROT 6.4 (L) 11/11/2016 1426   ALBUMIN 4.1 11/11/2016 1426   AST 30 11/11/2016 1426   ALT 24 11/11/2016 1426   ALKPHOS 58 11/11/2016 1426   BILITOT 0.6 11/11/2016 1426   GFRNONAA >60 11/11/2016 1426   GFRAA >60 11/11/2016 1426    No results found for: SPEP, UPEP  Lab Results  Component Value Date   WBC 4.8 03/25/2017   NEUTROABS 4.3 02/19/2014   HGB 14.3 03/25/2017   HCT 43.4 03/25/2017   MCV 85.6 03/25/2017   PLT 204 03/25/2017      Chemistry      Component Value Date/Time   NA 140 11/11/2016 1426   K 3.7 11/11/2016  1426   CL 107 11/11/2016 1426   CO2 25 11/11/2016 1426   BUN 22 (H) 11/11/2016 1426   CREATININE 1.01 11/11/2016 1426      Component Value Date/Time   CALCIUM 9.2 11/11/2016 1426   ALKPHOS 58 11/11/2016 1426   AST 30 11/11/2016 1426   ALT 24 11/11/2016 1426   BILITOT 0.6 11/11/2016 1426       RADIOGRAPHIC STUDIES: I have reviewed plan of care and imaging study with the patient and his girlfriend I have personally reviewed the radiological images as listed and agreed with the findings in the report. Ct Biopsy  Result Date: 03/25/2017 INDICATION: History of  adenoid cystic carcinoma of the salivary gland, now with enlarging bilateral pulmonary nodules worrisome for progression of metastatic disease. Please perform CT-guided pulmonary nodule biopsy for tissue diagnostic purposes. EXAM: CT-GUIDED BIOPSY OF LEFT LOWER LOBE PULMONARY NODULE COMPARISON:  Chest CT - 02/16/2017; 08/06/2016 MEDICATIONS: None. ANESTHESIA/SEDATION: Fentanyl 100 mcg IV; Versed 1.5 mg IV Sedation time: 19 minutes; The patient was continuously monitored during the procedure by the interventional radiology nurse under my direct supervision. CONTRAST:  None COMPLICATIONS: None immediate. PROCEDURE: Informed consent was obtained from the patient following an explanation of the procedure, risks, benefits and alternatives. The patient understands,agrees and consents for the procedure. All questions were addressed. A time out was performed prior to the initiation of the procedure. The patient was positioned prone on the CT table and a limited chest CT was performed for procedural planning demonstrating unchanged size and appearance of an approximately 1.4 x 1.3 cm nodule within in the subpleural aspect the left lower lobe (image 34, series 2). The operative site was prepped and draped in the usual sterile fashion. Under sterile conditions and local anesthesia, a 17 gauge coaxial needle was advanced into the peripheral aspect of the  nodule. Positioning was confirmed with intermittent CT fluoroscopy and followed by the acquisition of 3 core needle biopsies with an 18 gauge core needle biopsy device. The coaxial needle was removed following deployment of a Bios[employer] plug and superficial hemostasis was achieved with manual compression. Limited post procedural chest CT was negative for pneumothorax or additional complication. A dressing was placed. The patient tolerated the procedure well without immediate postprocedural complication. The patient was escorted to have an upright chest radiograph. IMPRESSION: Technically successful CT guided core needle core biopsy of indeterminate enlarging left lower lobe pulmonary nodule. Electronically Signed   By: Sandi Mariscal M.D.   On: 03/25/2017 13:55   Dg Chest Port 1 View  Result Date: 03/25/2017 CLINICAL DATA:  Post lung biopsy follow up - biopsy done of left lower pulm nodule/mass - pt states that he is not having any unusual SOB or CP following procedure EXAM: PORTABLE CHEST - 1 VIEW COMPARISON:  CT biopsy from earlier the same day FINDINGS: No pneumothorax post biopsy of left lung nodule. Heart size and mediastinal contours are within normal limits. No effusion. Visualized bones unremarkable. IMPRESSION: No pneumothorax post biopsy. I sent via Spark messaging the critical test results to Dr. Pascal Lux at the time of interpretation. Electronically Signed   By: Lucrezia Europe M.D.   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[place]) Recent lung biopsy confirmed adenoid cystic carcinoma Unfortunately, due to scant tissue, it is not possible to add more testing PD-L1 is negative Prior salivary gland specimen in 2016 tested negative for HER-2/neu receptor I have requested additional testing on that tissue sample, to add c-kit mutation, EGFR as well as androgen receptors The patient is mildly symptomatic with nonspecific chest wall pain I recommend repeat  CT scan of the chest next week for further assessment If target mutations are not able, recommendation would be conventional chemotherapy The combination chemotherapy using Cytoxan, Adriamycin and cisplatin could be useful versus single agent therapy I will discuss further plan of care with her and his girlfriend next week We briefly discussed expected risks of treatment including risk of nausea, vomiting, congestive heart failure, peripheral neuropathy and pancytopenia.  Metastasis to lung [facility]) He complain of mild nonspecific chest wall pain in the right posterior region of his  rib cage Examination is benign Due to presence of symptoms, I recommend repeat CT scan with contrast and he agreed   Orders Placed This Encounter  Procedures  . CT CHEST W CONTRAST    Standing Status:   Future    Standing Expiration Date:   04/12/2018    Order Specific Question:   If indicated for the ordered procedure, I authorize the administration of contrast media per Radiology protocol    Answer:   Yes    Order Specific Question:   Preferred imaging location?    Answer:   [facility]    Order Specific Question:   Radiology Contrast Protocol - do NOT remove file path    Answer:   [email or web address]   All questions were answered. The patient knows to call the clinic with any problems, questions or concerns. No barriers to learning was detected. I spent 25 minutes counseling the patient face to face. The total time spent in the appointment was 30 minutes and more than 50% was on counseling and review of test results     Heath Lark, MD 04/12/2017 4:27 PM

## 2017-04-12 NOTE — Telephone Encounter (Signed)
Could not schedule dr appointment until override completed for 12/12 @3pm 

## 2017-04-18 ENCOUNTER — Ambulatory Visit (HOSPITAL_COMMUNITY)
Admission: RE | Admit: 2017-04-18 | Discharge: 2017-04-18 | Disposition: A | Discharge status: Home / Self Care | Payer: 59 | Source: Ambulatory Visit | Attending: Hematology and Oncology | Admitting: Hematology and Oncology

## 2017-04-18 ENCOUNTER — Encounter (HOSPITAL_COMMUNITY): Payer: Self-pay

## 2017-04-18 DIAGNOSIS — C7800 Secondary malignant neoplasm of unspecified lung: Secondary | ICD-10-CM | POA: Diagnosis not present

## 2017-04-18 DIAGNOSIS — C801 Malignant (primary) neoplasm, unspecified: Secondary | ICD-10-CM | POA: Insufficient documentation

## 2017-04-18 DIAGNOSIS — C080 Malignant neoplasm of submandibular gland: Secondary | ICD-10-CM

## 2017-04-18 MED ORDER — IOPAMIDOL (ISOVUE-300) INJECTION 61%
INTRAVENOUS | Status: AC
Start: 2017-04-18 — End: 2017-04-18
  Administered 2017-04-18: 75 mL via INTRAVENOUS
  Filled 2017-04-18: qty 75

## 2017-04-18 MED ORDER — IOPAMIDOL (ISOVUE-300) INJECTION 61%
75.0000 mL | Freq: Once | INTRAVENOUS | Status: AC | PRN
  Administered 2017-04-18: 75 mL via INTRAVENOUS

## 2017-04-20 ENCOUNTER — Ambulatory Visit (HOSPITAL_BASED_OUTPATIENT_CLINIC_OR_DEPARTMENT_OTHER): Payer: 59 | Admitting: Hematology and Oncology

## 2017-04-20 VITALS — BP 141/87 | HR 69 | Temp 98.3°F | Resp 18 | Ht 72.0 in | Wt 203.6 lb

## 2017-04-20 DIAGNOSIS — C7802 Secondary malignant neoplasm of left lung: Secondary | ICD-10-CM | POA: Diagnosis not present

## 2017-04-20 DIAGNOSIS — C080 Malignant neoplasm of submandibular gland: Secondary | ICD-10-CM

## 2017-04-20 DIAGNOSIS — C7800 Secondary malignant neoplasm of unspecified lung: Secondary | ICD-10-CM

## 2017-04-20 DIAGNOSIS — C7801 Secondary malignant neoplasm of right lung: Secondary | ICD-10-CM | POA: Diagnosis not present

## 2017-04-20 DIAGNOSIS — Z5111 Encounter for antineoplastic chemotherapy: Secondary | ICD-10-CM

## 2017-04-22 ENCOUNTER — Telehealth: Payer: Self-pay | Admitting: *Deleted

## 2017-04-22 ENCOUNTER — Encounter: Payer: Self-pay | Admitting: Hematology and Oncology

## 2017-04-22 DIAGNOSIS — Z5111 Encounter for antineoplastic chemotherapy: Secondary | ICD-10-CM | POA: Insufficient documentation

## 2017-04-22 NOTE — Telephone Encounter (Signed)
-----   Message from Heath Lark, MD sent at 04/22/2017 10:16 AM EST ----- Regarding: all tests unhelpful Pls let him know all tests are back, no actionable target Plan to proceed with standard chemo. I plan to order port, echo and chemo class I will see him on 1/2 for final consent & first dose chemo will be on 1/7 Please call him and let him know

## 2017-04-22 NOTE — Progress Notes (Signed)
START OFF PATHWAY REGIMEN - Head and Neck   Custom Intervention:cytoxan doxorubicin cisplatin:     Cyclophosphamide      Cisplatin      Doxorubicin   **Always confirm dose/schedule in your pharmacy ordering system**    Patient Characteristics: Other Sites, Metastatic (Squamous Cell), First Line Disease Classification: Other Sites AJCC 8 Stage Grouping: IVC Current Disease Status: Metastatic Disease Line of therapy: First Line Would you be surprised if this patient died  in the next year<= I would be surprised if this patient died in the next year Intent of Therapy: Non-Curative / Palliative Intent, Discussed with Patient

## 2017-04-22 NOTE — Telephone Encounter (Signed)
Notified of message below. Verbalized understanding 

## 2017-04-22 NOTE — Assessment & Plan Note (Signed)
He is mildly symptomatic with atypical chest discomfort He denies cough or shortness of breath

## 2017-04-22 NOTE — Progress Notes (Signed)
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 No acute intracranial abnormality. 2. Swollen right submandibular gland containing prominent calcifications consistent with submandibular gland stones. Submandibular gland enlargement is most likely inflammatory. Malignancy cannot be entirely excluded .      07/12/2014 Pathology Results    Submandibular gland, Right - ADENOID CYSTIC CARCINOMA, GRADE I OF III, SEE COMMENT. - POSITIVE FOR PERINEURAL INVASION. - TUMOR INVOLVES SURGICAL EXCISIONAL MARGIN - SEE TUMOR SYNOPTIC TEMPLATE BELOW. Microscopic Comment ONCOLOGY TABLE - SALIVARY GLAND 1. Specimen: Right submandibular gland 2. Procedure: Excision. 3. Tumor site and laterality: Right submandibular gland 4. Tumor focality: Unifocal 5. Maximum tumor size (cm): 3.8 cm 6. Histologic type: Adenoid cystic carcinoma. 7. Grade: I of III 8. Margins: Positive Distance of tumor from closest margin: Present at margin 9. Perineural invasion: Present 10. Lymph-Vascular invasion: Absent. 11. Lymph nodes: # examined - 0; # positive - N/A; extracapsular extension - N/A 12. TNM code: pT2, pNX, pMX      07/12/2014 Surgery    Right submandibular gland excision       07/24/2014 Miscellaneous    The patient was seen by radiation oncologist locally who offered him adjuvant radiation treatment.  The patient obtained second opinion at [facility] who recommended against adjuvant radiation treatment.  The patient finally decided not proceed with radiation therapy      07/29/2014 Imaging    No findings specific for metastatic disease in the chest or  abdomen. 4 mm subpleural nodule in the medial left lower lobe, likely reflecting a benign subpleural lymph node, although technically indeterminate. Attention on follow-up is suggested.       07/30/2014 Imaging    MR face: Postop resection of right submandibular gland for adenoid cystic carcinoma. No evidence of perineural spread. No skullbase or cavernous sinus involvement.       08/12/2014 Surgery    Right level I, 2 neck selective dissection       08/12/2014 Pathology Results    Lymph nodes, radical neck dissection, limited right neck - BENIGN SALIVARY GLAND TISSUE; NEGATIVE FOR ATYPIA OR MALIGNANCY. - BENIGN SKIN WITH DERMAL AND SUBCUTANEOUS SCAR AND INFLAMMATORY TISSUE REACTION. - TWO INTRA-SALIVARY GLAND LYMPH NODES, NEGATIVE FOR TUMOR (0/2) - ONE LEVEL ONE-A LYMPH NODE, NEGATIVE FOR TUMOR (0/1). - SIX LEVEL TWO LYMPH NODES, NEGATIVE FOR TUMOR (0/6)      04/07/2015 Imaging    MR neck: 1. Status post resection of right submandibular gland. 2. No evidence for perineural spread or tumor recurrence. 3. Stable central and bilateral foraminal narrowing at C6-7.      09/25/2015 Imaging    MR neck: Stable exam. No evidence of residual or recurrent right submandibular carcinoma.      07/08/2016 Procedure    He underwent repeat excision of right submandibular mass: A 2 x 3 cm ellipse was made around mass incorporating the scar. This was carried down through skin and subcutaneous fat and the dissection was completed at the level of the sternomastoid muscle. Examination of the specimen revealed complete removal. This was sent for pathologic interpretation.  07/09/2016 Pathology Results    Adenoid cystic carcinoma (margins free of neoplasm)      08/06/2016 Imaging    CT scan of chest, abdomen and pelvis: 1. There are now multiple pulmonary nodules consistent with metastatic disease to the lungs. One nodule was present previously, measuring 4 mm, noted along the pleural margin of  the left lower lobe. This is most likely a benign granuloma. The remaining visualized nodules are new consistent with metastatic disease, given the patient's history. 2. No other findings of metastatic disease within the chest. 3. No evidence of metastatic disease in the abdomen or pelvis. 4. No acute findings.      08/06/2016 Imaging    Ct neck: 1. Multiple new bilateral apical pulmonary nodules measuring up to 1.3 cm and concerning for metastatic disease. 2. 22 x 7 mm right level III lymph node, enlarged from 2016 and indeterminate. 3. Prior right submandibular gland resection without evidence of locally recurrent mass.      08/06/2016 Imaging    MR face: Prior right submandibular gland resection without evidence of recurrent tumor      10/12/2016 Imaging    There is no new mediastinal or hilar lymphadenopathy.  The heart is normal in size and there is no pericardial or pleural effusion.  Bilateral pulmonary nodules have slightly increased in size, suspicious for metastatic disease. For instance a 14 mm nodule on image 39 previously measured 12 mm. The largest nodule in the right lower lobe measures 24 mm in diameter.  Limited imaging through the upper abdomen shows normal adrenal glands.      10/12/2016 Imaging    CT neck 1.No evidence for primary or nodal recurrence in the neck. 2.Bilateral pulmonary nodules.      02/16/2017 Imaging    Ct chest showed enlarging pulmonary metastases.      02/16/2017 Imaging    1. Unchanged examination of the neck with stable size of right level 3 and left level IIa lymph nodes. 2. Please see dedicated report for concomitant chest CT for findings below thoracic inlet.      03/25/2017 Procedure    Technically successful CT guided core needle core biopsy of indeterminate enlarging left lower lobe pulmonary nodule.      03/25/2017 Pathology Results    Lung, biopsy, Left lower lobe - FINDINGS CONSISTENT WITH METASTATIC ADENOID CYSTIC CARCINOMA,  SEE COMMENT. Microscopic Comment There is a small focus of tumor with similar morphology to the patient's prior adenoid cystic carcinoma of the submandibular gland ([id number], reviewed).       04/18/2017 Imaging    Progressive bilateral pulmonary metastases, as above. Three subcentimeter hypervascular lesions in the right liver, unchanged from March 2018, indeterminate.       INTERVAL HISTORY: Please see below for problem oriented charting. He returns to review test results He continues to have atypical chest discomfort No recent fever, chills, cough or significant shortness of breath  REVIEW OF SYSTEMS:   Constitutional: Denies fevers, chills or abnormal weight loss Eyes: Denies blurriness of vision Ears, nose, mouth, throat, and face: Denies mucositis or sore throat Respiratory: Denies cough, dyspnea or wheezes Gastrointestinal:  Denies nausea, heartburn or change in bowel habits Skin: Denies abnormal skin rashes Lymphatics: Denies new lymphadenopathy or easy bruising Neurological:Denies numbness, tingling or new weaknesses Behavioral/Psych: Mood is stable, no new changes  All other systems were reviewed with the patient and are negative.  I have reviewed the past medical history, past surgical history, social history and family history with  the patient and they are unchanged from previous note.  ALLERGIES:  is allergic to hydrocodone.  MEDICATIONS:  Current Outpatient Medications  Medication Sig Dispense Refill  . DULoxetine (CYMBALTA) 60 MG capsule Take 1 capsule (60 mg total) by mouth daily. 30 capsule 0  . famotidine (PEPCID AC) 10 MG tablet Take 10 mg daily as needed by mouth for heartburn or indigestion.    Marland Kitchen ibuprofen (ADVIL,MOTRIN) 200 MG tablet Take 200-800 mg 2 (two) times daily as needed by mouth for moderate pain.    Marland Kitchen loratadine (CLARITIN) 10 MG tablet Take 10 mg by mouth daily as needed for allergies.     . Multiple Vitamin (MULTIVITAMIN WITH MINERALS) TABS  tablet Take 1 tablet daily by mouth.    . naproxen sodium (ALEVE) 220 MG tablet Take 440 mg daily as needed by mouth (pain).     No current facility-administered medications for this visit.     PHYSICAL EXAMINATION: ECOG PERFORMANCE STATUS: 1 - Symptomatic but completely ambulatory  Vitals:   04/20/17 1452  BP: (!) 141/87  Pulse: 69  Resp: 18  Temp: 98.3 F (36.8 C)  SpO2: 97%   Filed Weights   04/20/17 1452  Weight: 203 lb 9.6 oz (92.4 kg)    GENERAL:alert, no distress and comfortable SKIN: skin color, texture, turgor are normal, no rashes or significant lesions EYES: normal, Conjunctiva are pink and non-injected, sclera clear OROPHARYNX:no exudate, no erythema and lips, buccal mucosa, and tongue normal  NECK: supple, thyroid normal size, non-tender, without nodularity LYMPH:  no palpable lymphadenopathy in the cervical, axillary or inguinal LUNGS: clear to auscultation and percussion with normal breathing effort HEART: regular rate & rhythm and no murmurs and no lower extremity edema ABDOMEN:abdomen soft, non-tender and normal bowel sounds Musculoskeletal:no cyanosis of digits and no clubbing  NEURO: alert & oriented x 3 with fluent speech, no focal motor/sensory deficits  LABORATORY DATA:  I have reviewed the data as listed    Component Value Date/Time   NA 140 11/11/2016 1426   K 3.7 11/11/2016 1426   CL 107 11/11/2016 1426   CO2 25 11/11/2016 1426   GLUCOSE 86 11/11/2016 1426   BUN 22 (H) 11/11/2016 1426   CREATININE 1.01 11/11/2016 1426   CALCIUM 9.2 11/11/2016 1426   PROT 6.4 (L) 11/11/2016 1426   ALBUMIN 4.1 11/11/2016 1426   AST 30 11/11/2016 1426   ALT 24 11/11/2016 1426   ALKPHOS 58 11/11/2016 1426   BILITOT 0.6 11/11/2016 1426   GFRNONAA >60 11/11/2016 1426   GFRAA >60 11/11/2016 1426    No results found for: SPEP, UPEP  Lab Results  Component Value Date   WBC 4.8 03/25/2017   NEUTROABS 4.3 02/19/2014   HGB 14.3 03/25/2017   HCT 43.4  03/25/2017   MCV 85.6 03/25/2017   PLT 204 03/25/2017      Chemistry      Component Value Date/Time   NA 140 11/11/2016 1426   K 3.7 11/11/2016 1426   CL 107 11/11/2016 1426   CO2 25 11/11/2016 1426   BUN 22 (H) 11/11/2016 1426   CREATININE 1.01 11/11/2016 1426      Component Value Date/Time   CALCIUM 9.2 11/11/2016 1426   ALKPHOS 58 11/11/2016 1426   AST 30 11/11/2016 1426   ALT 24 11/11/2016 1426   BILITOT 0.6 11/11/2016 1426       RADIOGRAPHIC STUDIES: I have personally reviewed the radiological images as listed and agreed with the findings in the report.  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 CONTRAST:  75 mL Isovue 300 IV COMPARISON:  02/16/2017 FINDINGS: Cardiovascular: The heart is normal in size. No pericardial effusion. No evidence of thoracic aortic aneurysm. Mediastinum/Nodes: No suspicious mediastinal, hilar, or axillary lymphadenopathy. Visualized thyroid is unremarkable. Lungs/Pleura: Progressive bilateral pulmonary metastases. For example: --2.0 cm left upper lobe nodule (series 7/ image 38), previously 1.6 cm --1.8 cm right upper lobe nodule (series 7/ image 48), previously 1.6 cm --3.1 cm subpleural left lower lobe mass adjacent to the left heart border (series 7/ image 100), previously 2.9 cm --1.6 x 3.6 cm subpleural right lower lobe mass adjacent to the posterior right heart border (series 7/image 101), previously 1.4 x 3.5 cm --1.6 cm left lower lobe nodule (series 7/ image 103), previously 1.4 cm No focal consolidation. No pleural effusion or pneumothorax. Upper Abdomen: Three subcentimeter hypervascular lesions in the right liver (series 2/ images 157 and 175), similar to March 2018, although slight increase in conspicuity is favored to be  related to phase of enhancement. Musculoskeletal: Degenerative changes of the midthoracic spine. IMPRESSION: Progressive bilateral pulmonary metastases, as above. Three subcentimeter hypervascular lesions in the right liver, unchanged from March 2018, indeterminate. Electronically Signed   By: Julian Hy M.D.   On: 04/18/2017 12:21   Ct Biopsy  Result Date: 03/25/2017 INDICATION: History of adenoid cystic carcinoma of the salivary gland, now with enlarging bilateral pulmonary nodules worrisome for progression of metastatic disease. Please perform CT-guided pulmonary nodule biopsy for tissue diagnostic purposes. EXAM: CT-GUIDED BIOPSY OF LEFT LOWER LOBE PULMONARY NODULE COMPARISON:  Chest CT - 02/16/2017; 08/06/2016 MEDICATIONS: None. ANESTHESIA/SEDATION: Fentanyl 100 mcg IV; Versed 1.5 mg IV Sedation time: 19 minutes; The patient was continuously monitored during the procedure by the interventional radiology nurse under my direct supervision. CONTRAST:  None COMPLICATIONS: None immediate. PROCEDURE: Informed consent was obtained from the patient following an explanation of the procedure, risks, benefits and alternatives. The patient understands,agrees and consents for the procedure. All questions were addressed. A time out was performed prior to the initiation of the procedure. The patient was positioned prone on the CT table and a limited chest CT was performed for procedural planning demonstrating unchanged size and appearance of an approximately 1.4 x 1.3 cm nodule within in the subpleural aspect the left lower lobe (image 34, series 2). The operative site was prepped and draped in the usual sterile fashion. Under sterile conditions and local anesthesia, a 17 gauge coaxial needle was advanced into the peripheral aspect of the nodule. Positioning was confirmed with intermittent CT fluoroscopy and followed by the acquisition of 3 core needle biopsies with an 18 gauge core needle biopsy device. The  coaxial needle was removed following deployment of a Biosentry plug and superficial hemostasis was achieved with manual compression. Limited post procedural chest CT was negative for pneumothorax or additional complication. A dressing was placed. The patient tolerated the procedure well without immediate postprocedural complication. The patient was escorted to have an upright chest radiograph. IMPRESSION: Technically successful CT guided core needle core biopsy of indeterminate enlarging left lower lobe pulmonary nodule. Electronically Signed   By: Sandi Mariscal M.D.   On: 03/25/2017 13:55   Dg Chest Port 1 View  Result Date: 03/25/2017 CLINICAL DATA:  Post lung biopsy follow up - biopsy done of left  lower pulm nodule/mass - pt states that he is not having any unusual SOB or CP following procedure EXAM: PORTABLE CHEST - 1 VIEW COMPARISON:  CT biopsy from earlier the same day FINDINGS: No pneumothorax post biopsy of left lung nodule. Heart size and mediastinal contours are within normal limits. No effusion. Visualized bones unremarkable. IMPRESSION: No pneumothorax post biopsy. I sent via Spark messaging the critical test results to Dr. Pascal Lux at the time of interpretation. Electronically Signed   By: Lucrezia Europe M.D.   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) All molecular studies came back negative for actionable mutation The patient is concerned about disease progression noted on imaging study He is mildly symptomatic with occasional chest wall discomfort We discussed briefly the risks, benefits, side effects of chemotherapy and he wants to proceed I would arrange for echocardiogram and port placement along with chemo education class I will consent him for treatment after the new year with plan to start first dose of chemotherapy within the first week of January  Metastasis to lung [facility]) He is mildly symptomatic with atypical chest  discomfort He denies cough or shortness of breath   Orders Placed This Encounter  Procedures  . IR FLUORO GUIDE PORT INSERTION RIGHT    Standing Status:   Future    Standing Expiration Date:   06/23/2018    Order Specific Question:   Reason for Exam (SYMPTOM  OR DIAGNOSIS REQUIRED)    Answer:   port for chemo    Order Specific Question:   Preferred Imaging Location?    Answer:   [facility]  . ECHOCARDIOGRAM LIMITED    Standing Status:   Future    Standing Expiration Date:   07/22/2018    Order Specific Question:   Where should this test be performed    Answer:   [place]    Order Specific Question:   Perflutren DEFINITY (image enhancing agent) should be administered unless hypersensitivity or allergy exist    Answer:   Administer Perflutren    Order Specific Question:   Expected Date:    Answer:   1 week   All questions were answered. The patient knows to call the clinic with any problems, questions or concerns. No barriers to learning was detected. I spent 25 minutes counseling the patient face to face. The total time spent in the appointment was 30 minutes and more than 50% was on counseling and review of test results     Heath Lark, MD 04/22/2017 12:52 PM

## 2017-04-22 NOTE — Assessment & Plan Note (Signed)
All molecular studies came back negative for actionable mutation The patient is concerned about disease progression noted on imaging study He is mildly symptomatic with occasional chest wall discomfort We discussed briefly the risks, benefits, side effects of chemotherapy and he wants to proceed I would arrange for echocardiogram and port placement along with chemo education class I will consent him for treatment after the new year with plan to start first dose of chemotherapy within the first week of January

## 2017-04-23 ENCOUNTER — Telehealth: Payer: Self-pay | Admitting: Hematology and Oncology

## 2017-04-23 NOTE — Telephone Encounter (Signed)
Scheduled appt per 12/14 sch message -

## 2017-04-25 ENCOUNTER — Telehealth: Payer: Self-pay | Admitting: Hematology and Oncology

## 2017-04-25 NOTE — Telephone Encounter (Signed)
Scheduled appt per 12/14 sch message - patient is aware of apt date and time./

## 2017-04-29 ENCOUNTER — Ambulatory Visit (HOSPITAL_COMMUNITY)
Admission: RE | Admit: 2017-04-29 | Discharge: 2017-04-29 | Disposition: A | Discharge status: Home / Self Care | Payer: 59 | Source: Ambulatory Visit | Attending: Hematology and Oncology | Admitting: Hematology and Oncology

## 2017-04-29 ENCOUNTER — Other Ambulatory Visit: Payer: Self-pay | Admitting: Hematology and Oncology

## 2017-04-29 DIAGNOSIS — I340 Nonrheumatic mitral (valve) insufficiency: Secondary | ICD-10-CM | POA: Diagnosis not present

## 2017-04-29 DIAGNOSIS — Z5111 Encounter for antineoplastic chemotherapy: Secondary | ICD-10-CM | POA: Diagnosis not present

## 2017-04-29 DIAGNOSIS — C7800 Secondary malignant neoplasm of unspecified lung: Secondary | ICD-10-CM

## 2017-04-29 DIAGNOSIS — C080 Malignant neoplasm of submandibular gland: Secondary | ICD-10-CM | POA: Diagnosis not present

## 2017-04-29 NOTE — Progress Notes (Signed)
  Echocardiogram 2D Echocardiogram has been performed.  [name] 04/29/2017, 10:25 AM

## 2017-05-03 ENCOUNTER — Other Ambulatory Visit: Payer: Self-pay | Admitting: Radiology

## 2017-05-04 ENCOUNTER — Other Ambulatory Visit: Payer: Self-pay | Admitting: Radiology

## 2017-05-05 ENCOUNTER — Encounter: Payer: Self-pay | Admitting: *Deleted

## 2017-05-05 ENCOUNTER — Other Ambulatory Visit: Payer: Self-pay | Admitting: Hematology and Oncology

## 2017-05-05 ENCOUNTER — Ambulatory Visit (HOSPITAL_COMMUNITY)
Admission: RE | Admit: 2017-05-05 | Discharge: 2017-05-05 | Disposition: A | Discharge status: Home / Self Care | Payer: 59 | Source: Ambulatory Visit | Attending: Hematology and Oncology | Admitting: Hematology and Oncology

## 2017-05-05 ENCOUNTER — Encounter (HOSPITAL_COMMUNITY): Payer: Self-pay

## 2017-05-05 DIAGNOSIS — Z5111 Encounter for antineoplastic chemotherapy: Secondary | ICD-10-CM

## 2017-05-05 DIAGNOSIS — Z87891 Personal history of nicotine dependence: Secondary | ICD-10-CM | POA: Diagnosis not present

## 2017-05-05 DIAGNOSIS — C7800 Secondary malignant neoplasm of unspecified lung: Secondary | ICD-10-CM

## 2017-05-05 DIAGNOSIS — C080 Malignant neoplasm of submandibular gland: Secondary | ICD-10-CM

## 2017-05-05 HISTORY — PX: IR US GUIDE VASC ACCESS RIGHT: IMG2390

## 2017-05-05 HISTORY — PX: IR FLUORO GUIDE PORT INSERTION RIGHT: IMG5741

## 2017-05-05 LAB — BASIC METABOLIC PANEL
ANION GAP: 8 (ref 5–15)
BUN: 14 mg/dL (ref 6–20)
CALCIUM: 9.7 mg/dL (ref 8.9–10.3)
CHLORIDE: 107 mmol/L (ref 101–111)
CO2: 23 mmol/L (ref 22–32)
CREATININE: 0.8 mg/dL (ref 0.61–1.24)
GFR calc non Af Amer: >60 mL/min (ref >60)
Glucose, Bld: 91 mg/dL (ref 65–99)
Potassium: 3.9 mmol/L (ref 3.5–5.1)
SODIUM: 138 mmol/L (ref 135–145)

## 2017-05-05 LAB — CBC WITH DIFFERENTIAL/PLATELET
BASOS PCT: 0 %
Basophils Absolute: 0 10*3/uL (ref 0.0–0.1)
EOS ABS: 0.1 10*3/uL (ref 0.0–0.7)
Eosinophils Relative: 1 %
HEMATOCRIT: 44.3 % (ref 39.0–52.0)
HEMOGLOBIN: 15.4 g/dL (ref 13.0–17.0)
Lymphocytes Relative: 18 %
Lymphs Abs: 1.6 10*3/uL (ref 0.7–4.0)
MCH: 29.4 pg (ref 26.0–34.0)
MCHC: 34.8 g/dL (ref 30.0–36.0)
MCV: 84.5 fL (ref 78.0–100.0)
MONOS PCT: 8 %
Monocytes Absolute: 0.7 10*3/uL (ref 0.1–1.0)
NEUTROS ABS: 6.1 10*3/uL (ref 1.7–7.7)
NEUTROS PCT: 73 %
Platelets: 212 10*3/uL (ref 150–400)
RBC: 5.24 MIL/uL (ref 4.22–5.81)
RDW: 13.4 % (ref 11.5–15.5)
WBC: 8.5 10*3/uL (ref 4.0–10.5)

## 2017-05-05 LAB — PROTIME-INR
INR: 0.92
PROTHROMBIN TIME: 12.3 s (ref 11.4–15.2)

## 2017-05-05 MED ORDER — SODIUM CHLORIDE 0.9 % IV SOLN
INTRAVENOUS | Status: DC
  Administered 2017-05-05: 12:00:00 via INTRAVENOUS

## 2017-05-05 MED ORDER — MIDAZOLAM HCL 2 MG/2ML IJ SOLN
INTRAMUSCULAR | Status: AC | PRN
  Administered 2017-05-05: 2 mg via INTRAVENOUS
  Administered 2017-05-05 (×2): 1 mg via INTRAVENOUS

## 2017-05-05 MED ORDER — LIDOCAINE-EPINEPHRINE (PF) 2 %-1:200000 IJ SOLN
INTRAMUSCULAR | Status: AC
  Filled 2017-05-05: qty 20

## 2017-05-05 MED ORDER — CEFAZOLIN SODIUM-DEXTROSE 2-4 GM/100ML-% IV SOLN
2.0000 g | INTRAVENOUS | Status: AC
  Administered 2017-05-05: 2 g via INTRAVENOUS

## 2017-05-05 MED ORDER — MIDAZOLAM HCL 2 MG/2ML IJ SOLN
INTRAMUSCULAR | Status: AC
  Filled 2017-05-05: qty 4

## 2017-05-05 MED ORDER — FENTANYL CITRATE (PF) 100 MCG/2ML IJ SOLN
INTRAMUSCULAR | Status: AC | PRN
  Administered 2017-05-05 (×4): 50 ug via INTRAVENOUS

## 2017-05-05 MED ORDER — CEFAZOLIN SODIUM-DEXTROSE 2-4 GM/100ML-% IV SOLN
INTRAVENOUS | Status: AC
  Administered 2017-05-05: 2 g via INTRAVENOUS
  Filled 2017-05-05: qty 100

## 2017-05-05 MED ORDER — HEPARIN SOD (PORK) LOCK FLUSH 100 UNIT/ML IV SOLN
INTRAVENOUS | Status: AC
  Filled 2017-05-05: qty 5

## 2017-05-05 MED ORDER — LIDOCAINE-EPINEPHRINE (PF) 2 %-1:200000 IJ SOLN
INTRAMUSCULAR | Status: AC | PRN
  Administered 2017-05-05: 20 mL

## 2017-05-05 MED ORDER — FENTANYL CITRATE (PF) 100 MCG/2ML IJ SOLN
INTRAMUSCULAR | Status: AC
  Filled 2017-05-05: qty 4

## 2017-05-05 NOTE — Procedures (Signed)
Pre Procedure Dx: Poor venous access Post Procedural Dx: Same  Successful placement of right IJ approach port-a-cath with tip at the superior caval atrial junction. The catheter is ready for immediate use.  Estimated Blood Loss: Minimal  Complications: None immediate.  Jay Tameeka Luo, MD Pager #: [phone]   

## 2017-05-05 NOTE — H&P (Signed)
Chief Complaint: adenoid cystic carcinoma of submandibular gland  Referring Physician:Dr. Heath Lark  Supervising Physician: Sandi Mariscal  Patient Status: [facility] - Out-pt  HPI: [name] is a 41 y.o. male with a history of adenoid cystic carcinoma of the submandibular gland who is known to the IR service for a recent lung nodule bx to confirm metastatic disease.  He has had no change in his medical history since then.  He presents today for [facility] placement so chemotherapy can be initiated.  He has had some recent chills, but no fevers.  No chest pain or shortness of breath.  Occasional headaches, but no pain or other complaints.    Past Medical History:  Past Medical History:  Diagnosis Date  . Arthritis   . Cancer ([place])   . Carcinoma ([place]) 07/2014   adenoid cystic  . Depression   . GERD (gastroesophageal reflux disease)    otc  . History of kidney stones   . History of stress test    workup done for syncope   . 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 Syncopal episodes   . Tinnitus of both ears     Past Surgical History:  Past Surgical History:  Procedure Laterality Date  . LUMBAR LAMINECTOMY/DECOMPRESSION MICRODISCECTOMY N/A 11/12/2016   Procedure: MICRODISCECTOMY RIGHT L5-S1;  Surgeon: Jessy Oto, MD;  Location: [place];  Service: Orthopedics;  Laterality: N/A;  . NECK SURGERY    . none    . SUBMANDIBULAR GLAND EXCISION Right 60/63/0160   DR Erik Obey  . SUBMANDIBULAR GLAND EXCISION Right 07/12/2014   Procedure: EXCISION RIGHT SUBMANDIBULAR GLAND;  Surgeon: [name], MD;  Location: [place];  Service: ENT;  Laterality: Right;  . SUBMANDIBULAR GLAND EXCISION Right 08/12/2014   Procedure: Selected neck dissection;  Surgeon: [name], MD;  Location[facility];  Service: ENT;  Laterality: Right;    Family History:  Family History  Adopted: Yes  Family history unknown: Yes    Social History:  reports that he quit smoking about 6 years ago. He has a 4.50 pack-year smoking  history. He has quit using smokeless tobacco. His smokeless tobacco use included chew. He reports that he drinks alcohol. He reports that he uses drugs. Drug: Marijuana.  Allergies:  Allergies  Allergen Reactions  . Hydrocodone Itching    Medications: Medications reviewed in epic  Please HPI for pertinent positives, otherwise complete 10 system ROS negative.  Mallampati Score: MD Evaluation Airway: WNL Heart: WNL Abdomen: WNL Chest/ Lungs: WNL ASA  Classification: 2 Mallampati/Airway Score: One  Physical Exam: BP 120/80 (BP Location: Left Arm)   Pulse 62   Temp 98.1 F (36.7 C) (Oral)   Resp 18   SpO2 98%  There is no height or weight on file to calculate BMI. General: pleasant, WD, WN white male who is laying in bed in NAD HEENT: head is normocephalic, atraumatic.  Sclera are noninjected.  PERRL.  Ears and nose without any masses or lesions.  Mouth is pink and moist Heart: regular, rate, and rhythm.  Normal s1,s2. No obvious murmurs, gallops, or rubs noted.  Palpable radial pulses bilaterally Lungs: CTAB, no wheezes, rhonchi, or rales noted.  Respiratory effort nonlabored Abd: soft, NT, ND, +BS, no masses, hernias, or organomegaly Psych: A&Ox3 with an appropriate affect.   Labs: Results for orders placed or performed during the hospital encounter of 05/05/17 (from the past 48 hour(s))  Basic metabolic panel     Status: None   Collection Time:  05/05/17 11:51 AM  Result Value Ref Range   Sodium 138 135 - 145 mmol/L   Potassium 3.9 3.5 - 5.1 mmol/L   Chloride 107 101 - 111 mmol/L   CO2 23 22 - 32 mmol/L   Glucose, Bld 91 65 - 99 mg/dL   BUN 14 6 - 20 mg/dL   Creatinine, Ser 0.80 0.61 - 1.24 mg/dL   Calcium 9.7 8.9 - 10.3 mg/dL   GFR calc non Af Amer >60 >60 mL/min   GFR calc Af Amer >60 >60 mL/min    Comment: (NOTE) The eGFR has been calculated using the CKD EPI equation. This calculation has not been validated in all clinical situations. eGFR's persistently <60  mL/min signify possible Chronic Kidney Disease.    Anion gap 8 5 - 15  CBC with Differential/Platelet     Status: None   Collection Time: 05/05/17 11:51 AM  Result Value Ref Range   WBC 8.5 4.0 - 10.5 K/uL   RBC 5.24 4.22 - 5.81 MIL/uL   Hemoglobin 15.4 13.0 - 17.0 g/dL   HCT 44.3 39.0 - 52.0 %   MCV 84.5 78.0 - 100.0 fL   MCH 29.4 26.0 - 34.0 pg   MCHC 34.8 30.0 - 36.0 g/dL   RDW 13.4 11.5 - 15.5 %   Platelets 212 150 - 400 K/uL   Neutrophils Relative % 73 %   Neutro Abs 6.1 1.7 - 7.7 K/uL   Lymphocytes Relative 18 %   Lymphs Abs 1.6 0.7 - 4.0 K/uL   Monocytes Relative 8 %   Monocytes Absolute 0.7 0.1 - 1.0 K/uL   Eosinophils Relative 1 %   Eosinophils Absolute 0.1 0.0 - 0.7 K/uL   Basophils Relative 0 %   Basophils Absolute 0.0 0.0 - 0.1 K/uL  Protime-INR     Status: None   Collection Time: 05/05/17 11:51 AM  Result Value Ref Range   Prothrombin Time 12.3 11.4 - 15.2 seconds   INR 0.92     Imaging: No results found.  Assessment/Plan 1. Adenoid cystic carcinoma of submandibular gland  Will proceed with PAC placement today so the patient can start chemotherapy in the new year.  His labs and vitals have been reviewed.  Risks and benefits discussed with the patient including, but not limited to bleeding, infection, pneumothorax, or fibrin sheath development and need for additional procedures. All of the patient's questions were answered, patient is agreeable to proceed. Consent signed and in chart.  Thank you for this interesting consult.  I greatly enjoyed meeting [name] and look forward to participating in their care.  A copy of this report was sent to the requesting provider on this date.  Electronically Signed: Henreitta Cea 05/05/2017, 1:02 PM   I spent a total of    25 Minutes in face to face in clinical consultation, greater than 50% of which was counseling/coordinating care for adenoid cystic carcinoma of submandibular gland

## 2017-05-05 NOTE — Discharge Instructions (Signed)
Moderate Conscious Sedation, Adult, Care After °These instructions provide you with information about caring for yourself after your procedure. Your health care provider may also give you more specific instructions. Your treatment has been planned according to current medical practices, but problems sometimes occur. Call your health care provider if you have any problems or questions after your procedure. °What can I expect after the procedure? °After your procedure, it is common: °· To feel sleepy for several hours. °· To feel clumsy and have poor balance for several hours. °· To have poor judgment for several hours. °· To vomit if you eat too soon. ° °Follow these instructions at home: °For at least 24 hours after the procedure: ° °· Do not: °? Participate in activities where you could fall or become injured. °? Drive. °? Use heavy machinery. °? Drink alcohol. °? Take sleeping pills or medicines that cause drowsiness. °? Make important decisions or sign legal documents. °? Take care of children on your own. °· Rest. °Eating and drinking °· Follow the diet recommended by your health care provider. °· If you vomit: °? Drink water, juice, or soup when you can drink without vomiting. °? Make sure you have little or no nausea before eating solid foods. °General instructions °· Have a responsible adult stay with you until you are awake and alert. °· Take over-the-counter and prescription medicines only as told by your health care provider. °· If you smoke, do not smoke without supervision. °· Keep all follow-up visits as told by your health care provider. This is important. °Contact a health care provider if: °· You keep feeling nauseous or you keep vomiting. °· You feel light-headed. °· You develop a rash. °· You have a fever. °Get help right away if: °· You have trouble breathing. °This information is not intended to replace advice given to you by your health care provider. Make sure you discuss any questions you have  with your health care provider. °Document Released: 02/14/2013 Document Revised: 09/29/2015 Document Reviewed: 08/16/2015 °Elsevier Interactive Patient Education © 2018 [employer] ° ° °Implanted Port Insertion, Care After °This sheet gives you information about how to care for yourself after your procedure. Your health care provider may also give you more specific instructions. If you have problems or questions, contact your health care provider. °What can I expect after the procedure? °After your procedure, it is common to have: °· Discomfort at the port insertion site. °· Bruising on the skin over the port. This should improve over 3-4 days. ° °Follow these instructions at home: °Port care °· After your port is placed, you will get a manufacturer's information card. The card has information about your port. Keep this card with you at all times. °· Take care of the port as told by your health care provider. Ask your health care provider if you or a family member can get training for taking care of the port at home. A home health care nurse may also take care of the port. °· Make sure to remember what type of port you have. °Incision care °· Follow instructions from your health care provider about how to take care of your port insertion site. Make sure you: °? Wash your hands with soap and water before you change your bandage (dressing). If soap and water are not available, use hand sanitizer. °? Change your dressing as told by your health care provider.  You may remove your dressing tomorrow. °? Leave skin glue in place. These skin closures   may need to stay in place for 2 weeks or longer. If adhesive strip edges start to loosen and curl up, you may trim the loose edges. Do not remove adhesive strips completely unless your health care provider tells you to do that.  DO NOT use EMLA cream for 2 weeks after port placement as this cream will remove surgical glue on your incision.  Check your port insertion site  every day for signs of infection. Check for: ? More redness, swelling, or pain. ? More fluid or blood. ? Warmth. ? Pus or a bad smell. General instructions  Do not take baths, swim, or use a hot tub until your health care provider approves.  You may shower tomorrow.  Do not lift anything that is heavier than 10 lb (4.5 kg) for a week, or as told by your health care provider.  Ask your health care provider when it is okay to: ? Return to work or school. ? Resume usual physical activities or sports.  Do not drive for 24 hours if you were given a medicine to help you relax (sedative).  Take over-the-counter and prescription medicines only as told by your health care provider.  Wear a medical alert bracelet in case of an emergency. This will tell any health care providers that you have a port.  Keep all follow-up visits as told by your health care provider. This is important. Contact a health care provider if:  You have a fever or chills.  You have more redness, swelling, or pain around your port insertion site.  You have more fluid or blood coming from your port insertion site.  Your port insertion site feels warm to the touch.  You have pus or a bad smell coming from the port insertion site. Get help right away if:  You have chest pain or shortness of breath.  You have bleeding from your port that you cannot control. Summary  Take care of the port as told by your health care provider.  Change your dressing as told by your health care provider.  Keep all follow-up visits as told by your health care provider. This information is not intended to replace advice given to you by your health care provider. Make sure you discuss any questions you have with your health care provider. Document Released: 02/14/2013 Document Revised: 03/17/2016 Document Reviewed: 03/17/2016 Elsevier Interactive Patient Education  2017 [employer].

## 2017-05-09 ENCOUNTER — Other Ambulatory Visit: Payer: Self-pay | Admitting: Family Medicine

## 2017-05-09 DIAGNOSIS — F419 Anxiety disorder, unspecified: Secondary | ICD-10-CM

## 2017-05-09 DIAGNOSIS — F329 Major depressive disorder, single episode, unspecified: Secondary | ICD-10-CM

## 2017-05-09 DIAGNOSIS — F32A Depression, unspecified: Secondary | ICD-10-CM

## 2017-05-09 DIAGNOSIS — C080 Malignant neoplasm of submandibular gland: Secondary | ICD-10-CM

## 2017-05-09 DIAGNOSIS — M5416 Radiculopathy, lumbar region: Secondary | ICD-10-CM

## 2017-05-09 DIAGNOSIS — M5441 Lumbago with sciatica, right side: Secondary | ICD-10-CM

## 2017-05-11 ENCOUNTER — Other Ambulatory Visit: Payer: Self-pay | Admitting: Hematology and Oncology

## 2017-05-11 ENCOUNTER — Telehealth: Payer: Self-pay | Admitting: Hematology and Oncology

## 2017-05-11 ENCOUNTER — Other Ambulatory Visit (HOSPITAL_BASED_OUTPATIENT_CLINIC_OR_DEPARTMENT_OTHER): Payer: 59

## 2017-05-11 ENCOUNTER — Ambulatory Visit (HOSPITAL_BASED_OUTPATIENT_CLINIC_OR_DEPARTMENT_OTHER): Payer: 59 | Admitting: Hematology and Oncology

## 2017-05-11 ENCOUNTER — Other Ambulatory Visit: Payer: 59

## 2017-05-11 ENCOUNTER — Encounter: Payer: Self-pay | Admitting: *Deleted

## 2017-05-11 VITALS — BP 127/90 | HR 54 | Resp 18 | Ht 72.0 in | Wt 206.5 lb

## 2017-05-11 DIAGNOSIS — C7800 Secondary malignant neoplasm of unspecified lung: Secondary | ICD-10-CM

## 2017-05-11 DIAGNOSIS — C7801 Secondary malignant neoplasm of right lung: Secondary | ICD-10-CM | POA: Diagnosis not present

## 2017-05-11 DIAGNOSIS — C080 Malignant neoplasm of submandibular gland: Secondary | ICD-10-CM

## 2017-05-11 DIAGNOSIS — C7802 Secondary malignant neoplasm of left lung: Secondary | ICD-10-CM | POA: Diagnosis not present

## 2017-05-11 DIAGNOSIS — Z7189 Other specified counseling: Secondary | ICD-10-CM

## 2017-05-11 LAB — CBC WITH DIFFERENTIAL/PLATELET
BASO%: 0.4 % (ref 0.0–2.0)
BASOS ABS: 0 10*3/uL (ref 0.0–0.1)
EOS%: 3.5 % (ref 0.0–7.0)
Eosinophils Absolute: 0.2 10*3/uL (ref 0.0–0.5)
HCT: 45.1 % (ref 38.4–49.9)
HGB: 14.9 g/dL (ref 13.0–17.1)
LYMPH%: 34.2 % (ref 14.0–49.0)
MCH: 28.5 pg (ref 27.2–33.4)
MCHC: 33 g/dL (ref 32.0–36.0)
MCV: 86.2 fL (ref 79.3–98.0)
MONO#: 0.6 10*3/uL (ref 0.1–0.9)
MONO%: 11.5 % (ref 0.0–14.0)
NEUT#: 2.4 10*3/uL (ref 1.5–6.5)
NEUT%: 50.4 % (ref 39.0–75.0)
Platelets: 159 10*3/uL (ref 140–400)
RBC: 5.23 10*6/uL (ref 4.20–5.82)
RDW: 13.3 % (ref 11.0–14.6)
WBC: 4.8 10*3/uL (ref 4.0–10.3)
lymph#: 1.6 10*3/uL (ref 0.9–3.3)

## 2017-05-11 LAB — COMPREHENSIVE METABOLIC PANEL
ALT: 61 U/L — ABNORMAL HIGH (ref 0–55)
AST: 53 U/L — AB (ref 5–34)
Albumin: 4.2 g/dL (ref 3.5–5.0)
Alkaline Phosphatase: 114 U/L (ref 40–150)
Anion Gap: 10 mEq/L (ref 3–11)
BUN: 14.2 mg/dL (ref 7.0–26.0)
CO2: 26 mEq/L (ref 22–29)
Calcium: 9.6 mg/dL (ref 8.4–10.4)
Chloride: 107 mEq/L (ref 98–109)
Creatinine: 0.9 mg/dL (ref 0.7–1.3)
EGFR: >60 mL/min/{1.73_m2} (ref >60)
GLUCOSE: 82 mg/dL (ref 70–140)
POTASSIUM: 4.1 meq/L (ref 3.5–5.1)
Sodium: 142 mEq/L (ref 136–145)
Total Bilirubin: 0.48 mg/dL (ref 0.20–1.20)
Total Protein: 7.7 g/dL (ref 6.4–8.3)

## 2017-05-11 LAB — MAGNESIUM: Magnesium: 2.2 mg/dl (ref 1.5–2.5)

## 2017-05-11 MED ORDER — DULOXETINE HCL 60 MG PO CPEP: 60.0000 mg | ORAL_CAPSULE | Freq: Every day | ORAL | 0 refills | Status: DC

## 2017-05-11 MED ORDER — LIDOCAINE-PRILOCAINE 2.5-2.5 % EX CREA: 1.0000 "application " | TOPICAL_CREAM | CUTANEOUS | 6 refills | Status: DC | PRN

## 2017-05-11 MED ORDER — ONDANSETRON HCL 8 MG PO TABS: 8.0000 mg | ORAL_TABLET | Freq: Two times a day (BID) | ORAL | 1 refills | Status: DC | PRN

## 2017-05-11 MED ORDER — PROCHLORPERAZINE MALEATE 10 MG PO TABS: 10.0000 mg | ORAL_TABLET | Freq: Four times a day (QID) | ORAL | 1 refills | Status: DC | PRN

## 2017-05-11 NOTE — Telephone Encounter (Signed)
Patient scheduled per 1/2 los. Patient declined AVs and calendar will receive update in MyChart.

## 2017-05-12 ENCOUNTER — Encounter: Payer: Self-pay | Admitting: Hematology and Oncology

## 2017-05-12 NOTE — Progress Notes (Signed)
Called patient to introduce myself as Arboriculturist and to ask about financial questions or concerns. Asked patient if his insurance renewed this year and amounts to be met started over for the new year. He states he thinks so because he received new cards. Advised patient that he may apply for copay assistance for Neulasta through [place] which would cover his first injection at 100% and each additional injection after would only leave him with a $5 copay. Patient gave me permission to enroll him. Enrolled patient online. He was approved for $10,000 for calendar year. Advised him that there is nothing he needs to keep up with as I will submit claims and process payments on my end. 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  Also discussed with him the one-time $400 [place] which can assist with personal expenses such as gas cards and other personal household expenses. Patient is currently not working and has no income therefore I will be approving him for the grant. Advised him I will go over thi with him on 05/16/17.  Also mentioned [employer] which helps cancer patients whom reside in [place]. Advised him I will give him the application on 08/17/15 as well if interested and he states he is.  Patient asked about disability and states his job does not offer short or long term. Advised patient to discuss with doctor and then he would need to speak with a social worker regarding SSDI. He verbalized understanding. I will also give him my card on 05/16/17.

## 2017-05-13 ENCOUNTER — Encounter: Payer: Self-pay | Admitting: Hematology and Oncology

## 2017-05-13 DIAGNOSIS — Z7189 Other specified counseling: Secondary | ICD-10-CM | POA: Insufficient documentation

## 2017-05-13 NOTE — Progress Notes (Signed)
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 No acute intracranial abnormality. 2. Swollen right submandibular gland containing prominent calcifications consistent with submandibular gland stones. Submandibular gland enlargement is most likely inflammatory. Malignancy cannot be entirely excluded .      07/12/2014 Pathology Results    Submandibular gland, Right - ADENOID CYSTIC CARCINOMA, GRADE I OF III, SEE COMMENT. - POSITIVE FOR PERINEURAL INVASION. - TUMOR INVOLVES SURGICAL EXCISIONAL MARGIN - SEE TUMOR SYNOPTIC TEMPLATE BELOW. Microscopic Comment ONCOLOGY TABLE - SALIVARY GLAND 1. Specimen: Right submandibular gland 2. Procedure: Excision. 3. Tumor site and laterality: Right submandibular gland 4. Tumor focality: Unifocal 5. Maximum tumor size (cm): 3.8 cm 6. Histologic type: Adenoid cystic carcinoma. 7. Grade: I of III 8. Margins: Positive Distance of tumor from closest margin: Present at margin 9. Perineural invasion: Present 10. Lymph-Vascular invasion: Absent. 11. Lymph nodes: # examined - 0; # positive - N/A; extracapsular extension - N/A 12. TNM code: pT2, pNX, pMX      07/12/2014 Surgery    Right submandibular gland excision       07/24/2014 Miscellaneous    The patient was seen by radiation oncologist locally who offered him adjuvant radiation treatment.  The patient obtained second opinion at [facility] who recommended against adjuvant radiation treatment.  The patient finally decided not proceed with radiation therapy      07/29/2014 Imaging    No findings specific for metastatic disease in the chest or  abdomen. 4 mm subpleural nodule in the medial left lower lobe, likely reflecting a benign subpleural lymph node, although technically indeterminate. Attention on follow-up is suggested.       07/30/2014 Imaging    MR face: Postop resection of right submandibular gland for adenoid cystic carcinoma. No evidence of perineural spread. No skullbase or cavernous sinus involvement.       08/12/2014 Surgery    Right level I, 2 neck selective dissection       08/12/2014 Pathology Results    Lymph nodes, radical neck dissection, limited right neck - BENIGN SALIVARY GLAND TISSUE; NEGATIVE FOR ATYPIA OR MALIGNANCY. - BENIGN SKIN WITH DERMAL AND SUBCUTANEOUS SCAR AND INFLAMMATORY TISSUE REACTION. - TWO INTRA-SALIVARY GLAND LYMPH NODES, NEGATIVE FOR TUMOR (0/2) - ONE LEVEL ONE-A LYMPH NODE, NEGATIVE FOR TUMOR (0/1). - SIX LEVEL TWO LYMPH NODES, NEGATIVE FOR TUMOR (0/6)      04/07/2015 Imaging    MR neck: 1. Status post resection of right submandibular gland. 2. No evidence for perineural spread or tumor recurrence. 3. Stable central and bilateral foraminal narrowing at C6-7.      09/25/2015 Imaging    MR neck: Stable exam. No evidence of residual or recurrent right submandibular carcinoma.      07/08/2016 Procedure    He underwent repeat excision of right submandibular mass: A 2 x 3 cm ellipse was made around mass incorporating the scar. This was carried down through skin and subcutaneous fat and the dissection was completed at the level of the sternomastoid muscle. Examination of the specimen revealed complete removal. This was sent for pathologic interpretation.  07/09/2016 Pathology Results    Adenoid cystic carcinoma (margins free of neoplasm)      08/06/2016 Imaging    CT scan of chest, abdomen and pelvis: 1. There are now multiple pulmonary nodules consistent with metastatic disease to the lungs. One nodule was present previously, measuring 4 mm, noted along the pleural margin of  the left lower lobe. This is most likely a benign granuloma. The remaining visualized nodules are new consistent with metastatic disease, given the patient's history. 2. No other findings of metastatic disease within the chest. 3. No evidence of metastatic disease in the abdomen or pelvis. 4. No acute findings.      08/06/2016 Imaging    Ct neck: 1. Multiple new bilateral apical pulmonary nodules measuring up to 1.3 cm and concerning for metastatic disease. 2. 22 x 7 mm right level III lymph node, enlarged from 2016 and indeterminate. 3. Prior right submandibular gland resection without evidence of locally recurrent mass.      08/06/2016 Imaging    MR face: Prior right submandibular gland resection without evidence of recurrent tumor      10/12/2016 Imaging    There is no new mediastinal or hilar lymphadenopathy.  The heart is normal in size and there is no pericardial or pleural effusion.  Bilateral pulmonary nodules have slightly increased in size, suspicious for metastatic disease. For instance a 14 mm nodule on image 39 previously measured 12 mm. The largest nodule in the right lower lobe measures 24 mm in diameter.  Limited imaging through the upper abdomen shows normal adrenal glands.      10/12/2016 Imaging    CT neck 1.No evidence for primary or nodal recurrence in the neck. 2.Bilateral pulmonary nodules.      02/16/2017 Imaging    Ct chest showed enlarging pulmonary metastases.      02/16/2017 Imaging    1. Unchanged examination of the neck with stable size of right level 3 and left level IIa lymph nodes. 2. Please see dedicated report for concomitant chest CT for findings below thoracic inlet.      03/25/2017 Procedure    Technically successful CT guided core needle core biopsy of indeterminate enlarging left lower lobe pulmonary nodule.      03/25/2017 Pathology Results    Lung, biopsy, Left lower lobe - FINDINGS CONSISTENT WITH METASTATIC ADENOID CYSTIC CARCINOMA,  SEE COMMENT. Microscopic Comment There is a small focus of tumor with similar morphology to the patient's prior adenoid cystic carcinoma of the submandibular gland ([id number], reviewed).       04/18/2017 Imaging    Progressive bilateral pulmonary metastases, as above. Three subcentimeter hypervascular lesions in the right liver, unchanged from March 2018, indeterminate.      05/05/2017 Procedure    Successful placement of a right internal jugular approach power injectable Port-A-Cath. The catheter is ready for immediate use       INTERVAL HISTORY: Please see below for problem oriented charting. He returns for chemotherapy consent He continues to have intermittent chest discomfort He denies cough, fever or chills. REVIEW OF SYSTEMS:   Constitutional: Denies fevers, chills or abnormal weight loss Eyes: Denies blurriness of vision Ears, nose, mouth, throat, and face: Denies mucositis or sore throat Respiratory: Denies cough, dyspnea or wheezes Gastrointestinal:  Denies nausea, heartburn or change in bowel habits Skin: Denies abnormal skin rashes Lymphatics: Denies new lymphadenopathy or easy bruising Neurological:Denies numbness, tingling or new weaknesses Behavioral/Psych: Mood is stable, no new changes  All other systems were reviewed with  the patient and are negative.  I have reviewed the past medical history, past surgical history, social history and family history with the patient and they are unchanged from previous note.  ALLERGIES:  is allergic to hydrocodone.  MEDICATIONS:  Current Outpatient Medications  Medication Sig Dispense Refill  . DULoxetine (CYMBALTA) 60 MG capsule Take 1 capsule (60 mg total) by mouth daily. 30 capsule 0  . famotidine (PEPCID AC) 10 MG tablet Take 10 mg daily as needed by mouth for heartburn or indigestion.    Marland Kitchen ibuprofen (ADVIL,MOTRIN) 200 MG tablet Take 200-800 mg 2 (two) times daily as needed by mouth for moderate pain.    Marland Kitchen  lidocaine-prilocaine (EMLA) cream Apply 1 application topically as needed. 30 g 6  . loratadine (CLARITIN) 10 MG tablet Take 10 mg by mouth daily as needed for allergies.     . Multiple Vitamin (MULTIVITAMIN WITH MINERALS) TABS tablet Take 1 tablet daily by mouth.    . naproxen sodium (ALEVE) 220 MG tablet Take 440 mg daily as needed by mouth (pain).    . ondansetron (ZOFRAN) 8 MG tablet Take 1 tablet (8 mg total) by mouth 2 (two) times daily as needed. Start on the third day after chemotherapy. 30 tablet 1  . prochlorperazine (COMPAZINE) 10 MG tablet Take 1 tablet (10 mg total) by mouth every 6 (six) hours as needed (Nausea or vomiting). 30 tablet 1   No current facility-administered medications for this visit.     PHYSICAL EXAMINATION: ECOG PERFORMANCE STATUS: 1 - Symptomatic but completely ambulatory  Vitals:   05/11/17 1140  BP: 127/90  Pulse: (!) 54  Resp: 18  SpO2: 100%   Filed Weights   05/11/17 1140  Weight: 206 lb 8 oz (93.7 kg)    GENERAL:alert, no distress and comfortable SKIN: skin color, texture, turgor are normal, no rashes or significant lesions EYES: normal, Conjunctiva are pink and non-injected, sclera clear Musculoskeletal:no cyanosis of digits and no clubbing  NEURO: alert & oriented x 3 with fluent speech, no focal motor/sensory deficits  LABORATORY DATA:  I have reviewed the data as listed    Component Value Date/Time   NA 142 05/11/2017 0917   K 4.1 05/11/2017 0917   CL 107 05/05/2017 1151   CO2 26 05/11/2017 0917   GLUCOSE 82 05/11/2017 0917   BUN 14.2 05/11/2017 0917   CREATININE 0.9 05/11/2017 0917   CALCIUM 9.6 05/11/2017 0917   PROT 7.7 05/11/2017 0917   ALBUMIN 4.2 05/11/2017 0917   AST 53 (H) 05/11/2017 0917   ALT 61 (H) 05/11/2017 0917   ALKPHOS 114 05/11/2017 0917   BILITOT 0.48 05/11/2017 0917   GFRNONAA >60 05/05/2017 1151   GFRAA >60 05/05/2017 1151    No results found for: SPEP, UPEP  Lab Results  Component Value Date   WBC  4.8 05/11/2017   NEUTROABS 2.4 05/11/2017   HGB 14.9 05/11/2017   HCT 45.1 05/11/2017   MCV 86.2 05/11/2017   PLT 159 05/11/2017      Chemistry      Component Value Date/Time   NA 142 05/11/2017 0917   K 4.1 05/11/2017 0917   CL 107 05/05/2017 1151   CO2 26 05/11/2017 0917   BUN 14.2 05/11/2017 0917   CREATININE 0.9 05/11/2017 0917      Component Value Date/Time   CALCIUM 9.6 05/11/2017 0917   ALKPHOS 114 05/11/2017 0917   AST 53 (H) 05/11/2017 0917   ALT 61 (H) 05/11/2017 0917   BILITOT  0.48 05/11/2017 0917       RADIOGRAPHIC STUDIES: I have personally reviewed the radiological images as listed and agreed with the findings in the report. 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 CONTRAST:  75 mL Isovue 300 IV COMPARISON:  02/16/2017 FINDINGS: Cardiovascular: The heart is normal in size. No pericardial effusion. No evidence of thoracic aortic aneurysm. Mediastinum/Nodes: No suspicious mediastinal, hilar, or axillary lymphadenopathy. Visualized thyroid is unremarkable. Lungs/Pleura: Progressive bilateral pulmonary metastases. For example: --2.0 cm left upper lobe nodule (series 7/ image 38), previously 1.6 cm --1.8 cm right upper lobe nodule (series 7/ image 48), previously 1.6 cm --3.1 cm subpleural left lower lobe mass adjacent to the left heart border (series 7/ image 100), previously 2.9 cm --1.6 x 3.6 cm subpleural right lower lobe mass adjacent to the posterior right heart border (series 7/image 101), previously 1.4 x 3.5 cm --1.6 cm left lower lobe nodule (series 7/ image 103), previously 1.4 cm No focal consolidation. No pleural effusion or pneumothorax. Upper Abdomen: Three subcentimeter hypervascular lesions in the right liver (series 2/ images 157 and  175), similar to March 2018, although slight increase in conspicuity is favored to be related to phase of enhancement. Musculoskeletal: Degenerative changes of the midthoracic spine. IMPRESSION: Progressive bilateral pulmonary metastases, as above. Three subcentimeter hypervascular lesions in the right liver, unchanged from March 2018, indeterminate. Electronically Signed   By: Julian Hy M.D.   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 In need of durable intravenous access for chemotherapy administration. EXAM: IMPLANTED PORT A CATH PLACEMENT WITH ULTRASOUND AND FLUOROSCOPIC GUIDANCE COMPARISON:  Chest CT - 04/18/2017 MEDICATIONS: Ancef 2 gm IV; The antibiotic was administered within an appropriate time interval prior to skin puncture. ANESTHESIA/SEDATION: Moderate (conscious) sedation was employed during this procedure. A total of Versed 4 mg and Fentanyl 200 mcg was administered intravenously. Moderate Sedation Time: 23 minutes. The patient's level of consciousness and vital signs were monitored continuously by radiology nursing throughout the procedure under my direct supervision. CONTRAST:  None FLUOROSCOPY TIME:  12 seconds (3 mGy) COMPLICATIONS: None immediate. PROCEDURE: The procedure, risks, benefits, and alternatives were explained to the patient. Questions regarding the procedure were encouraged and answered. The patient understands and consents to the procedure. The right neck and chest were prepped with chlorhexidine in a sterile fashion, and a sterile drape was applied covering the operative field. Maximum barrier sterile technique with sterile gowns and gloves were used for the procedure. A timeout was performed prior to the initiation of the procedure. Local anesthesia was provided with 1% lidocaine with epinephrine. After creating a small venotomy incision, a  micropuncture kit was utilized to access the internal jugular vein. Real-time ultrasound guidance was utilized for vascular access including the acquisition of a permanent ultrasound image documenting patency of the accessed vessel. The microwire was utilized to measure appropriate catheter length. A subcutaneous port pocket was then created along the upper chest wall utilizing a combination of sharp and blunt dissection. The pocket was irrigated with sterile saline. A single lumen thin power injectable port was chosen for placement. The 8 Fr catheter was tunneled from the port pocket site to the venotomy incision. The port was placed in  the pocket. The external catheter was trimmed to appropriate length. At the venotomy, an 8 Fr peel-away sheath was placed over a guidewire under fluoroscopic guidance. The catheter was then placed through the sheath and the sheath was removed. Final catheter positioning was confirmed and documented with a fluoroscopic spot radiograph. The port was accessed with a Huber needle, aspirated and flushed with heparinized saline. The venotomy site was closed with an interrupted 4-0 Vicryl suture. The port pocket incision was closed with interrupted 2-0 Vicryl suture and the skin was opposed with a running subcuticular 4-0 Vicryl suture. Dermabond and Steri-strips were applied to both incisions. Dressings were placed. The patient tolerated the procedure well without immediate post procedural complication. FINDINGS: After catheter placement, the tip lies within the superior cavoatrial junction. The catheter aspirates and flushes normally and is ready for immediate use. IMPRESSION: Successful placement of a right internal jugular approach power injectable Port-A-Cath. The catheter is ready for immediate use. Electronically Signed   By: Sandi Mariscal M.D.   On: 05/05/2017 14:48   Ir Fluoro Guide Port Insertion Right  Result Date: 05/05/2017 INDICATION: History of adenoid cystic carcinoma of  the submandibular gland, now metastatic. In need of durable intravenous access for chemotherapy administration. EXAM: IMPLANTED PORT A CATH PLACEMENT WITH ULTRASOUND AND FLUOROSCOPIC GUIDANCE COMPARISON:  Chest CT - 04/18/2017 MEDICATIONS: Ancef 2 gm IV; The antibiotic was administered within an appropriate time interval prior to skin puncture. ANESTHESIA/SEDATION: Moderate (conscious) sedation was employed during this procedure. A total of Versed 4 mg and Fentanyl 200 mcg was administered intravenously. Moderate Sedation Time: 23 minutes. The patient's level of consciousness and vital signs were monitored continuously by radiology nursing throughout the procedure under my direct supervision. CONTRAST:  None FLUOROSCOPY TIME:  12 seconds (3 mGy) COMPLICATIONS: None immediate. PROCEDURE: The procedure, risks, benefits, and alternatives were explained to the patient. Questions regarding the procedure were encouraged and answered. The patient understands and consents to the procedure. The right neck and chest were prepped with chlorhexidine in a sterile fashion, and a sterile drape was applied covering the operative field. Maximum barrier sterile technique with sterile gowns and gloves were used for the procedure. A timeout was performed prior to the initiation of the procedure. Local anesthesia was provided with 1% lidocaine with epinephrine. After creating a small venotomy incision, a micropuncture kit was utilized to access the internal jugular vein. Real-time ultrasound guidance was utilized for vascular access including the acquisition of a permanent ultrasound image documenting patency of the accessed vessel. The microwire was utilized to measure appropriate catheter length. A subcutaneous port pocket was then created along the upper chest wall utilizing a combination of sharp and blunt dissection. The pocket was irrigated with sterile saline. A single lumen thin power injectable port was chosen for placement.  The 8 Fr catheter was tunneled from the port pocket site to the venotomy incision. The port was placed in the pocket. The external catheter was trimmed to appropriate length. At the venotomy, an 8 Fr peel-away sheath was placed over a guidewire under fluoroscopic guidance. The catheter was then placed through the sheath and the sheath was removed. Final catheter positioning was confirmed and documented with a fluoroscopic spot radiograph. The port was accessed with a Huber needle, aspirated and flushed with heparinized saline. The venotomy site was closed with an interrupted 4-0 Vicryl suture. The port pocket incision was closed with interrupted 2-0 Vicryl suture and the skin was opposed with a running subcuticular 4-0 Vicryl suture. Dermabond  and Steri-strips were applied to both incisions. Dressings were placed. The patient tolerated the procedure well without immediate post procedural complication. FINDINGS: After catheter placement, the tip lies within the superior cavoatrial junction. The catheter aspirates and flushes normally and is ready for immediate use. IMPRESSION: Successful placement of a right internal jugular approach power injectable Port-A-Cath. The catheter is ready for immediate use. Electronically Signed   By: Sandi Mariscal M.D.   On: 05/05/2017 14:48    ASSESSMENT & PLAN:  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[facility]) All molecular studies came back negative for actionable mutation The patient is concerned about disease progression noted on imaging study He is mildly symptomatic with occasional chest wall discomfort We discussed briefly the risks, benefits, side effects of chemotherapy and he wants to proceed We discussed the role of chemotherapy. The intent is for palliative intent.  We discussed some of the risks, benefits and side-effects of Cytoxan, Adriamycin and cisplatin.   Some of the short term side-effects included, though not limited to, risk of fatigue, weight  loss, tumor lysis syndrome, risk of allergic reactions, pancytopenia, life-threatening infections, need for transfusions of blood products, nausea, vomiting, change in bowel habits, hair loss, risk of congestive heart failure, admission to hospital for various reasons, and risks of death.   Long term side-effects are also discussed including permanent damage to nerve function, chronic fatigue, and rare secondary malignancy including bone marrow disorders.   The patient is aware that the response rates discussed earlier is not guaranteed.    After a long discussion, patient made an informed decision to proceed with the prescribed plan of care.  Given high risk of severe infection, I will cover him with G-CSF support I will see him back prior to cycle 2 of therapy  Metastasis to lung [facility]) He is mildly symptomatic with atypical chest discomfort He denies cough or shortness of breath I plan to repeat imaging study after 3 cycles of treatment  Goals of care, counseling/discussion The patient is aware he has incurable disease and treatment is strictly palliative. We discussed importance of Advanced Directives and Living will. We discussed CODE STATUS; the patient desires to remain in full code.   No orders of the defined types were placed in this encounter.  All questions were answered. The patient knows to call the clinic with any problems, questions or concerns. No barriers to learning was detected. I spent 20 minutes counseling the patient face to face. The total time spent in the appointment was 30 minutes and more than 50% was on counseling and review of test results     Heath Lark, MD 05/13/2017 8:19 AM

## 2017-05-13 NOTE — Assessment & Plan Note (Signed)
The patient is aware he has incurable disease and treatment is strictly palliative. We discussed importance of Advanced Directives and Living will. We discussed CODE STATUS; the patient desires to remain in full code.

## 2017-05-13 NOTE — Assessment & Plan Note (Signed)
All molecular studies came back negative for actionable mutation The patient is concerned about disease progression noted on imaging study He is mildly symptomatic with occasional chest wall discomfort We discussed briefly the risks, benefits, side effects of chemotherapy and he wants to proceed We discussed the role of chemotherapy. The intent is for palliative intent.  We discussed some of the risks, benefits and side-effects of Cytoxan, Adriamycin and cisplatin.   Some of the short term side-effects included, though not limited to, risk of fatigue, weight loss, tumor lysis syndrome, risk of allergic reactions, pancytopenia, life-threatening infections, need for transfusions of blood products, nausea, vomiting, change in bowel habits, hair loss, risk of congestive heart failure, admission to hospital for various reasons, and risks of death.   Long term side-effects are also discussed including permanent damage to nerve function, chronic fatigue, and rare secondary malignancy including bone marrow disorders.   The patient is aware that the response rates discussed earlier is not guaranteed.    After a long discussion, patient made an informed decision to proceed with the prescribed plan of care.  Given high risk of severe infection, I will cover him with G-CSF support I will see him back prior to cycle 2 of therapy

## 2017-05-13 NOTE — Assessment & Plan Note (Signed)
He is mildly symptomatic with atypical chest discomfort He denies cough or shortness of breath I plan to repeat imaging study after 3 cycles of treatment

## 2017-05-16 ENCOUNTER — Inpatient Hospital Stay: Payer: 59 | Attending: Hematology and Oncology

## 2017-05-16 ENCOUNTER — Encounter: Payer: Self-pay | Admitting: Hematology and Oncology

## 2017-05-16 ENCOUNTER — Other Ambulatory Visit: Payer: Self-pay | Admitting: Hematology and Oncology

## 2017-05-16 ENCOUNTER — Inpatient Hospital Stay (HOSPITAL_BASED_OUTPATIENT_CLINIC_OR_DEPARTMENT_OTHER): Payer: 59 | Admitting: Medical

## 2017-05-16 VITALS — BP 131/72 | HR 66 | Temp 98.2°F | Resp 17

## 2017-05-16 DIAGNOSIS — Z5111 Encounter for antineoplastic chemotherapy: Secondary | ICD-10-CM | POA: Diagnosis present

## 2017-05-16 DIAGNOSIS — C080 Malignant neoplasm of submandibular gland: Secondary | ICD-10-CM

## 2017-05-16 DIAGNOSIS — Z5189 Encounter for other specified aftercare: Secondary | ICD-10-CM | POA: Diagnosis not present

## 2017-05-16 DIAGNOSIS — R231 Pallor: Secondary | ICD-10-CM | POA: Insufficient documentation

## 2017-05-16 DIAGNOSIS — T451X5A Adverse effect of antineoplastic and immunosuppressive drugs, initial encounter: Secondary | ICD-10-CM | POA: Insufficient documentation

## 2017-05-16 DIAGNOSIS — R0789 Other chest pain: Secondary | ICD-10-CM

## 2017-05-16 MED ORDER — FUROSEMIDE 10 MG/ML IJ SOLN: 20.0000 mg | Freq: Once | INTRAMUSCULAR | Status: DC

## 2017-05-16 MED ORDER — HEPARIN SOD (PORK) LOCK FLUSH 100 UNIT/ML IV SOLN
500.0000 [IU] | Freq: Once | INTRAVENOUS | Status: DC | PRN
  Filled 2017-05-16: qty 5

## 2017-05-16 MED ORDER — SODIUM CHLORIDE 0.9 % IV SOLN
500.0000 mg/m2 | Freq: Once | INTRAVENOUS | Status: AC
  Administered 2017-05-16: 1080 mg via INTRAVENOUS
  Filled 2017-05-16: qty 54

## 2017-05-16 MED ORDER — SODIUM CHLORIDE 0.9 % IV SOLN
Freq: Once | INTRAVENOUS | Status: AC
  Administered 2017-05-16: 11:00:00 via INTRAVENOUS
  Filled 2017-05-16: qty 5

## 2017-05-16 MED ORDER — PALONOSETRON HCL INJECTION 0.25 MG/5ML
0.2500 mg | Freq: Once | INTRAVENOUS | Status: AC
  Administered 2017-05-16: 0.25 mg via INTRAVENOUS

## 2017-05-16 MED ORDER — PEGFILGRASTIM 6 MG/0.6ML ~~LOC~~ PSKT: 6.0000 mg | PREFILLED_SYRINGE | Freq: Once | SUBCUTANEOUS | Status: DC

## 2017-05-16 MED ORDER — POTASSIUM CHLORIDE 2 MEQ/ML IV SOLN
Freq: Once | INTRAVENOUS | Status: AC
  Administered 2017-05-16: 10:00:00 via INTRAVENOUS
  Filled 2017-05-16: qty 10

## 2017-05-16 MED ORDER — DOXORUBICIN HCL CHEMO IV INJECTION 2 MG/ML
50.0000 mg/m2 | Freq: Once | INTRAVENOUS | Status: AC
  Administered 2017-05-16: 108 mg via INTRAVENOUS
  Filled 2017-05-16: qty 54

## 2017-05-16 MED ORDER — SODIUM CHLORIDE 0.9 % IV SOLN
100.0000 mg | Freq: Once | INTRAVENOUS | Status: AC
  Administered 2017-05-16: 100 mg via INTRAVENOUS
  Filled 2017-05-16: qty 100

## 2017-05-16 MED ORDER — SODIUM CHLORIDE 0.9 % IV SOLN
Freq: Once | INTRAVENOUS | Status: AC
  Administered 2017-05-16: 10:00:00 via INTRAVENOUS

## 2017-05-16 MED ORDER — SODIUM CHLORIDE 0.9% FLUSH
10.0000 mL | INTRAVENOUS | Status: DC | PRN
  Filled 2017-05-16: qty 10

## 2017-05-16 MED ORDER — METHYLPREDNISOLONE SODIUM SUCC 125 MG IJ SOLR
125.0000 mg | Freq: Once | INTRAMUSCULAR | Status: AC
Start: 2017-05-16 — End: 2017-05-16
  Administered 2017-05-16: 125 mg via INTRAVENOUS

## 2017-05-16 NOTE — Patient Instructions (Addendum)
[facility] Discharge Instructions for Patients Receiving Chemotherapy  Today you received the following chemotherapy agents:  Adriamycin, Cytoxan, and Cisplatin.  To help prevent nausea and vomiting after your treatment, we encourage you to take your nausea medication as directed.   If you develop nausea and vomiting that is not controlled by your nausea medication, call the clinic.   BELOW ARE SYMPTOMS THAT SHOULD BE REPORTED IMMEDIATELY:  *FEVER GREATER THAN 100.5 F  *CHILLS WITH OR WITHOUT FEVER  NAUSEA AND VOMITING THAT IS NOT CONTROLLED WITH YOUR NAUSEA MEDICATION  *UNUSUAL SHORTNESS OF BREATH  *UNUSUAL BRUISING OR BLEEDING  TENDERNESS IN MOUTH AND THROAT WITH OR WITHOUT PRESENCE OF ULCERS  *URINARY PROBLEMS  *BOWEL PROBLEMS  UNUSUAL RASH Items with * indicate a potential emergency and should be followed up as soon as possible.  Feel free to call the clinic should you have any questions or concerns. The clinic phone number is [phone].  Please show the [facility] at check-in to the Emergency Department and triage nurse.  Doxorubicin injection What is this medicine? DOXORUBICIN (dox oh ROO bi sin) is a chemotherapy drug. It is used to treat many kinds of cancer like leukemia, lymphoma, neuroblastoma, sarcoma, and Wilms' tumor. It is also used to treat bladder cancer, breast cancer, lung cancer, ovarian cancer, stomach cancer, and thyroid cancer. This medicine may be used for other purposes; ask your health care provider or pharmacist if you have questions. COMMON BRAND NAME(S): Adriamycin, Adriamycin PFS, Adriamycin RDF, Rubex What should I tell my health care provider before I take this medicine? They need to know if you have any of these conditions: -heart disease -history of low blood counts caused by a medicine -liver disease -recent or ongoing radiation therapy -an unusual or allergic reaction to doxorubicin, other chemotherapy agents,  other medicines, foods, dyes, or preservatives -pregnant or trying to get pregnant -breast-feeding How should I use this medicine? This drug is given as an infusion into a vein. It is administered in a hospital or clinic by a specially trained health care professional. If you have pain, swelling, burning or any unusual feeling around the site of your injection, tell your health care professional right away. Talk to your pediatrician regarding the use of this medicine in children. Special care may be needed. Overdosage: If you think you have taken too much of this medicine contact a poison control center or emergency room at once. NOTE: This medicine is only for you. Do not share this medicine with others. What if I miss a dose? It is important not to miss your dose. Call your doctor or health care professional if you are unable to keep an appointment. What may interact with this medicine? This medicine may interact with the following medications: -6-mercaptopurine -paclitaxel -phenytoin -St. John's Wort -trastuzumab -verapamil This list may not describe all possible interactions. Give your health care provider a list of all the medicines, herbs, non-prescription drugs, or dietary supplements you use. Also tell them if you smoke, drink alcohol, or use illegal drugs. Some items may interact with your medicine. What should I watch for while using this medicine? This drug may make you feel generally unwell. This is not uncommon, as chemotherapy can affect healthy cells as well as cancer cells. Report any side effects. Continue your course of treatment even though you feel ill unless your doctor tells you to stop. There is a maximum amount of this medicine you should receive throughout your life. The amount  depends on the medical condition being treated and your overall health. Your doctor will watch how much of this medicine you receive in your lifetime. Tell your doctor if you have taken this  medicine before. You may need blood work done while you are taking this medicine. Your urine may turn red for a few days after your dose. This is not blood. If your urine is dark or brown, call your doctor. In some cases, you may be given additional medicines to help with side effects. Follow all directions for their use. Call your doctor or health care professional for advice if you get a fever, chills or sore throat, or other symptoms of a cold or flu. Do not treat yourself. This drug decreases your body's ability to fight infections. Try to avoid being around people who are sick. This medicine may increase your risk to bruise or bleed. Call your doctor or health care professional if you notice any unusual bleeding. Talk to your doctor about your risk of cancer. You may be more at risk for certain types of cancers if you take this medicine. Do not become pregnant while taking this medicine or for 6 months after stopping it. Women should inform their doctor if they wish to become pregnant or think they might be pregnant. Men should not father a child while taking this medicine and for 6 months after stopping it. There is a potential for serious side effects to an unborn child. Talk to your health care professional or pharmacist for more information. Do not breast-feed an infant while taking this medicine. This medicine has caused ovarian failure in some women and reduced sperm counts in some men This medicine may interfere with the ability to have a child. Talk with your doctor or health care professional if you are concerned about your fertility. What side effects may I notice from receiving this medicine? Side effects that you should report to your doctor or health care professional as soon as possible: -allergic reactions like skin rash, itching or hives, swelling of the face, lips, or tongue -breathing problems -chest pain -fast or irregular heartbeat -low blood counts - this medicine may  decrease the number of white blood cells, red blood cells and platelets. You may be at increased risk for infections and bleeding. -pain, redness, or irritation at site where injected -signs of infection - fever or chills, cough, sore throat, pain or difficulty passing urine -signs of decreased platelets or bleeding - bruising, pinpoint red spots on the skin, black, tarry stools, blood in the urine -swelling of the ankles, feet, hands -tiredness -weakness Side effects that usually do not require medical attention (report to your doctor or health care professional if they continue or are bothersome): -diarrhea -hair loss -mouth sores -nail discoloration or damage -nausea -red colored urine -vomiting This list may not describe all possible side effects. Call your doctor for medical advice about side effects. You may report side effects to FDA at 1-800-FDA-1088. Where should I keep my medicine? This drug is given in a hospital or clinic and will not be stored at home. NOTE: This sheet is a summary. It may not cover all possible information. If you have questions about this medicine, talk to your doctor, pharmacist, or health care provider.  2018 Elsevier/Gold Standard (2015-06-23 11:28:51)  Cyclophosphamide injection What is this medicine? CYCLOPHOSPHAMIDE (sye kloe FOSS fa mide) is a chemotherapy drug. It slows the growth of cancer cells. This medicine is used to treat many types of cancer  like lymphoma, myeloma, leukemia, breast cancer, and ovarian cancer, to name a few. This medicine may be used for other purposes; ask your health care provider or pharmacist if you have questions. COMMON BRAND NAME(S): Cytoxan, Neosar What should I tell my health care provider before I take this medicine? They need to know if you have any of these conditions: -blood disorders -history of other chemotherapy -infection -kidney disease -liver disease -recent or ongoing radiation therapy -tumors in the  bone marrow -an unusual or allergic reaction to cyclophosphamide, other chemotherapy, other medicines, foods, dyes, or preservatives -pregnant or trying to get pregnant -breast-feeding How should I use this medicine? This drug is usually given as an injection into a vein or muscle or by infusion into a vein. It is administered in a hospital or clinic by a specially trained health care professional. Talk to your pediatrician regarding the use of this medicine in children. Special care may be needed. Overdosage: If you think you have taken too much of this medicine contact a poison control center or emergency room at once. NOTE: This medicine is only for you. Do not share this medicine with others. What if I miss a dose? It is important not to miss your dose. Call your doctor or health care professional if you are unable to keep an appointment. What may interact with this medicine? This medicine may interact with the following medications: -amiodarone -amphotericin B -azathioprine -certain antiviral medicines for HIV or AIDS such as protease inhibitors (e.g., indinavir, ritonavir) and zidovudine -certain blood pressure medications such as benazepril, captopril, enalapril, fosinopril, lisinopril, moexipril, monopril, perindopril, quinapril, ramipril, trandolapril -certain cancer medications such as anthracyclines (e.g., daunorubicin, doxorubicin), busulfan, cytarabine, paclitaxel, pentostatin, tamoxifen, trastuzumab -certain diuretics such as chlorothiazide, chlorthalidone, hydrochlorothiazide, indapamide, metolazone -certain medicines that treat or prevent blood clots like warfarin -certain muscle relaxants such as succinylcholine -cyclosporine -etanercept -indomethacin -medicines to increase blood counts like filgrastim, pegfilgrastim, sargramostim -medicines used as general anesthesia -metronidazole -natalizumab This list may not describe all possible interactions. Give your health care  provider a list of all the medicines, herbs, non-prescription drugs, or dietary supplements you use. Also tell them if you smoke, drink alcohol, or use illegal drugs. Some items may interact with your medicine. What should I watch for while using this medicine? Visit your doctor for checks on your progress. This drug may make you feel generally unwell. This is not uncommon, as chemotherapy can affect healthy cells as well as cancer cells. Report any side effects. Continue your course of treatment even though you feel ill unless your doctor tells you to stop. Drink water or other fluids as directed. Urinate often, even at night. In some cases, you may be given additional medicines to help with side effects. Follow all directions for their use. Call your doctor or health care professional for advice if you get a fever, chills or sore throat, or other symptoms of a cold or flu. Do not treat yourself. This drug decreases your body's ability to fight infections. Try to avoid being around people who are sick. This medicine may increase your risk to bruise or bleed. Call your doctor or health care professional if you notice any unusual bleeding. Be careful brushing and flossing your teeth or using a toothpick because you may get an infection or bleed more easily. If you have any dental work done, tell your dentist you are receiving this medicine. You may get drowsy or dizzy. Do not drive, use machinery, or do anything that needs  mental alertness until you know how this medicine affects you. Do not become pregnant while taking this medicine or for 1 year after stopping it. Women should inform their doctor if they wish to become pregnant or think they might be pregnant. Men should not father a child while taking this medicine and for 4 months after stopping it. There is a potential for serious side effects to an unborn child. Talk to your health care professional or pharmacist for more information. Do not  breast-feed an infant while taking this medicine. This medicine may interfere with the ability to have a child. This medicine has caused ovarian failure in some women. This medicine has caused reduced sperm counts in some men. You should talk with your doctor or health care professional if you are concerned about your fertility. If you are going to have surgery, tell your doctor or health care professional that you have taken this medicine. What side effects may I notice from receiving this medicine? Side effects that you should report to your doctor or health care professional as soon as possible: -allergic reactions like skin rash, itching or hives, swelling of the face, lips, or tongue -low blood counts - this medicine may decrease the number of white blood cells, red blood cells and platelets. You may be at increased risk for infections and bleeding. -signs of infection - fever or chills, cough, sore throat, pain or difficulty passing urine -signs of decreased platelets or bleeding - bruising, pinpoint red spots on the skin, black, tarry stools, blood in the urine -signs of decreased red blood cells - unusually weak or tired, fainting spells, lightheadedness -breathing problems -dark urine -dizziness -palpitations -swelling of the ankles, feet, hands -trouble passing urine or change in the amount of urine -weight gain -yellowing of the eyes or skin Side effects that usually do not require medical attention (report to your doctor or health care professional if they continue or are bothersome): -changes in nail or skin color -hair loss -missed menstrual periods -mouth sores -nausea, vomiting This list may not describe all possible side effects. Call your doctor for medical advice about side effects. You may report side effects to FDA at 1-800-FDA-1088. Where should I keep my medicine? This drug is given in a hospital or clinic and will not be stored at home. NOTE: This sheet is a  summary. It may not cover all possible information. If you have questions about this medicine, talk to your doctor, pharmacist, or health care provider.  2018 Elsevier/Gold Standard (2012-03-10 16:22:58)  Cisplatin injection What is this medicine? CISPLATIN (SIS pla tin) is a chemotherapy drug. It targets fast dividing cells, like cancer cells, and causes these cells to die. This medicine is used to treat many types of cancer like bladder, ovarian, and testicular cancers. This medicine may be used for other purposes; ask your health care provider or pharmacist if you have questions. COMMON BRAND NAME(S): Platinol, Platinol -AQ What should I tell my health care provider before I take this medicine? They need to know if you have any of these conditions: -blood disorders -hearing problems -kidney disease -recent or ongoing radiation therapy -an unusual or allergic reaction to cisplatin, carboplatin, other chemotherapy, other medicines, foods, dyes, or preservatives -pregnant or trying to get pregnant -breast-feeding How should I use this medicine? This drug is given as an infusion into a vein. It is administered in a hospital or clinic by a specially trained health care professional. Talk to your pediatrician regarding the use of  this medicine in children. Special care may be needed. Overdosage: If you think you have taken too much of this medicine contact a poison control center or emergency room at once. NOTE: This medicine is only for you. Do not share this medicine with others. What if I miss a dose? It is important not to miss a dose. Call your doctor or health care professional if you are unable to keep an appointment. What may interact with this medicine? -dofetilide -foscarnet -medicines for seizures -medicines to increase blood counts like filgrastim, pegfilgrastim, sargramostim -probenecid -pyridoxine used with altretamine -rituximab -some antibiotics like amikacin,  gentamicin, neomycin, polymyxin B, streptomycin, tobramycin -sulfinpyrazone -vaccines -zalcitabine Talk to your doctor or health care professional before taking any of these medicines: -acetaminophen -aspirin -ibuprofen -ketoprofen -naproxen This list may not describe all possible interactions. Give your health care provider a list of all the medicines, herbs, non-prescription drugs, or dietary supplements you use. Also tell them if you smoke, drink alcohol, or use illegal drugs. Some items may interact with your medicine. What should I watch for while using this medicine? Your condition will be monitored carefully while you are receiving this medicine. You will need important blood work done while you are taking this medicine. This drug may make you feel generally unwell. This is not uncommon, as chemotherapy can affect healthy cells as well as cancer cells. Report any side effects. Continue your course of treatment even though you feel ill unless your doctor tells you to stop. In some cases, you may be given additional medicines to help with side effects. Follow all directions for their use. Call your doctor or health care professional for advice if you get a fever, chills or sore throat, or other symptoms of a cold or flu. Do not treat yourself. This drug decreases your body's ability to fight infections. Try to avoid being around people who are sick. This medicine may increase your risk to bruise or bleed. Call your doctor or health care professional if you notice any unusual bleeding. Be careful brushing and flossing your teeth or using a toothpick because you may get an infection or bleed more easily. If you have any dental work done, tell your dentist you are receiving this medicine. Avoid taking products that contain aspirin, acetaminophen, ibuprofen, naproxen, or ketoprofen unless instructed by your doctor. These medicines may hide a fever. Do not become pregnant while taking this  medicine. Women should inform their doctor if they wish to become pregnant or think they might be pregnant. There is a potential for serious side effects to an unborn child. Talk to your health care professional or pharmacist for more information. Do not breast-feed an infant while taking this medicine. Drink fluids as directed while you are taking this medicine. This will help protect your kidneys. Call your doctor or health care professional if you get diarrhea. Do not treat yourself. What side effects may I notice from receiving this medicine? Side effects that you should report to your doctor or health care professional as soon as possible: -allergic reactions like skin rash, itching or hives, swelling of the face, lips, or tongue -signs of infection - fever or chills, cough, sore throat, pain or difficulty passing urine -signs of decreased platelets or bleeding - bruising, pinpoint red spots on the skin, black, tarry stools, nosebleeds -signs of decreased red blood cells - unusually weak or tired, fainting spells, lightheadedness -breathing problems -changes in hearing -gout pain -low blood counts - This drug may decrease the  number of white blood cells, red blood cells and platelets. You may be at increased risk for infections and bleeding. -nausea and vomiting -pain, swelling, redness or irritation at the injection site -pain, tingling, numbness in the hands or feet -problems with balance, movement -trouble passing urine or change in the amount of urine Side effects that usually do not require medical attention (report to your doctor or health care professional if they continue or are bothersome): -changes in vision -loss of appetite -metallic taste in the mouth or changes in taste This list may not describe all possible side effects. Call your doctor for medical advice about side effects. You may report side effects to FDA at 1-800-FDA-1088. Where should I keep my medicine? This drug  is given in a hospital or clinic and will not be stored at home. NOTE: This sheet is a summary. It may not cover all possible information. If you have questions about this medicine, talk to your doctor, pharmacist, or health care provider.  2018 Elsevier/Gold Standard (2007-08-01 14:40:54)

## 2017-05-16 NOTE — Progress Notes (Signed)
1315- After beginning cisplatin, pt c/o flushing. He is diaphoretic. C/o a "strange feeling" on his face. VSS. Infusion paused and NS started. 1318- Patient states that his symptoms have resolved. [place], [place] at chairside. Order for solumedrol 125 mg given. We will resume infusion in 15 minutes. 1414-Patient is tolerating infusion without any complaint or difficulty.

## 2017-05-16 NOTE — Progress Notes (Signed)
Per scheduling notes, ok to proceed with treatment with labs from 05/11/2017.

## 2017-05-16 NOTE — Progress Notes (Signed)
Met with patient in treatment area to introduce myself and to have him sign grant application. Also gave him [employer] application and advised on what and how to turn in. Patient verbalized understanding.  Patient given a copy of grant approval and expense sheet for the one-time $400 [place] and expenses discussed. Patient states he does not need anything today from his grant. Patient given my card for any additional financial questions or concerns.

## 2017-05-17 ENCOUNTER — Telehealth: Payer: Self-pay | Admitting: *Deleted

## 2017-05-17 NOTE — Telephone Encounter (Signed)
-----   Message from Sinda Du, RN sent at 05/16/2017  9:14 AM EST ----- Regarding: Dr. Alvy Bimler - 1st chemo f/u 1st chemo f/u

## 2017-05-17 NOTE — Progress Notes (Signed)
[facility] Progress Note   NOHA KARASIK [id number] [birth year] 42 y.o.  DOUGLAS SMOLINSKY is managed by Dr. Heath Lark  Actively treated with chemotherapy: yes  Current Therapy:  Cisplatin, cyclophosphamide, and doxorubicin with Neulasta support.  Last Treated: 05/16/2017 (cycle 1, day 1)  Assessment: Plan:    Chemotherapy adverse reaction, initial encounter  Tekoa R Jupin was seen in the infusion room for a suspected chemotherapy reaction. He was receiving cisplatin at the time of his reaction. He had received a total of approximately 5-6 minutes of his infusion prior to onset of symptoms. His symptoms included: Mild chest tightness and pallor. He was premedicated with Emend, dexamethasone, and Aloxi prior to starting chemotherapy. Cisplatin was paused and Jerre R Zanetti was given IV fluids and Solu-Medrol 125 mg IV after onset of his symptoms. Sidi R Goetzinger did  respond to intervention.  Mr. Roussel was able to complete his infusion of cisplatin without any additional incidents.   Please see After Visit Summary for patient specific instructions.  Future Appointments  Date Time Provider [place]  05/18/2017 12:45 PM [facility] INJ NURSE [facility] None  06/13/2017  7:45 AM [facility] LAB 6 [facility] None  06/13/2017  8:00 AM [facility] J32 DNS [facility] None  06/13/2017  8:30 AM Gorsuch, Ni, MD [facility] None  06/13/2017  9:30 AM [facility] D13 [facility] None    No orders of the defined types were placed in this encounter.      Subjective:   Patient ID:  DAMANTE SPRAGG is a 42 y.o. (DOB 01-Jan-1976) male.  Chief Complaint: No chief complaint on file.   HPI HYMAN CROSSAN was seen in the infusion room for a suspected reaction to chemotherapy. He was receiving cisplatin at the time of his reaction. He had received a total of approximately 5-6 minutes of his infusion prior to onset of symptoms. His symptoms included: Mild chest tightness and  pallor. He was premedicated with Emend, dexamethasone, and Aloxi prior to starting chemotherapy. Cisplatin was paused and Keziah R Dambrosia was given IV fluids and Solu-Medrol 125 mg IV after onset of his symptoms. Efosa R Lauf did  respond to intervention.  Mr. General was able to complete his infusion of cisplatin without any additional incidents.  Medications: I have reviewed the patient's current medications.  Allergies:  Allergies  Allergen Reactions  . Hydrocodone Itching    Past Medical History:  Diagnosis Date  . Arthritis   . Cancer ([place])   . 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 Depression   . GERD (gastroesophageal reflux disease)    otc  . History of kidney stones   . History of stress test    workup done for syncope   . 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 Syncopal episodes   . Tinnitus of both ears     Past Surgical History:  Procedure Laterality Date  . IR FLUORO GUIDE PORT INSERTION RIGHT  05/05/2017  . IR US GUIDE VASC ACCESS RIGHT  05/05/2017  . LUMBAR LAMINECTOMY/DECOMPRESSION MICRODISCECTOMY N/A 11/12/2016   Procedure: MICRODISCECTOMY RIGHT L5-S1;  Surgeon: Jessy Oto, MD;  Location: [facility];  Service: Orthopedics;  Laterality: N/A;  . NECK SURGERY    . none    . SUBMANDIBULAR GLAND EXCISION Right 02/13/1218   DR Erik Obey  . SUBMANDIBULAR GLAND EXCISION Right 07/12/2014   Procedure: EXCISION RIGHT SUBMANDIBULAR GLAND;  Surgeon: [name], MD;  Location: [facility];  Service: ENT;  Laterality: Right;  .  SUBMANDIBULAR GLAND EXCISION Right 08/12/2014   Procedure: Selected neck dissection;  Surgeon: [name], MD;  Location: [facility];  Service: ENT;  Laterality: Right;    Family History  Adopted: Yes  Family history unknown: Yes    Social History   Socioeconomic History  . Marital status: Legally Separated    Spouse name: Not on file  . Number of children: 1  . Years of education: Not on file  . Highest education level: Not on file  Social Needs  .  Financial resource strain: Not on file  . Food insecurity - worry: Not on file  . Food insecurity - inability: Not on file  . Transportation needs - medical: Not on file  . Transportation needs - non-medical: Not on file  Occupational History  . Not on file  Tobacco Use  . Smoking status: Former Smoker    Packs/day: 0.50    Years: 9.00    Pack years: 4.50    Last attempt to quit: 02/22/2011    Years since quitting: 6.2  . Smokeless tobacco: Former Systems developer    Types: Chew  Substance and Sexual Activity  . Alcohol use: Yes    Alcohol/week: 0.0 oz    Comment: occas. beer  STOPPED ON 3/4  . Drug use: Yes    Types: Marijuana    Comment: once weekly---STOPPED IT 3/4  . Sexual activity: Not on file  Other Topics Concern  . Not on file  Social History Narrative   Separated from his wife. Lives with his son.  Education: high school and [employer].  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   Lives with Lavella Lemons    Past Medical History, Surgical history, Social history, and Family history were reviewed and updated as appropriate.   Please see review of systems for further details on the patient's review from today.   Review of Systems:  Review of Systems  HENT: Negative for congestion, sneezing and trouble swallowing.   Respiratory: Positive for chest tightness. Negative for cough, choking and shortness of breath.   Cardiovascular: Negative for chest pain, palpitations and leg swelling.  Gastrointestinal: Negative for nausea.  Skin: Positive for pallor.    Objective:   Physical Exam:  There were no vitals taken for this visit.  Physical Exam  Constitutional: No distress.  HENT:  Head: Normocephalic.  Cardiovascular: Normal rate, regular rhythm and normal heart sounds. Exam reveals no gallop and no friction rub.  No murmur heard. Pulmonary/Chest: Effort normal and breath sounds normal. No respiratory distress. He has no wheezes. He  has no rales.  Neurological: He is alert.  Skin: He is not diaphoretic. There is pallor.  Psychiatric: He has a normal mood and affect. His behavior is normal. Judgment and thought content normal.    Lab Review:     Component Value Date/Time   NA 142 05/11/2017 0917   K 4.1 05/11/2017 0917   CL 107 05/05/2017 1151   CO2 26 05/11/2017 0917   GLUCOSE 82 05/11/2017 0917   BUN 14.2 05/11/2017 0917   CREATININE 0.9 05/11/2017 0917   CALCIUM 9.6 05/11/2017 0917   PROT 7.7 05/11/2017 0917   ALBUMIN 4.2 05/11/2017 0917   AST 53 (H) 05/11/2017 0917   ALT 61 (H) 05/11/2017 0917   ALKPHOS 114 05/11/2017 0917   BILITOT 0.48 05/11/2017 0917   GFRNONAA >60 05/05/2017 1151   GFRAA >60 05/05/2017 1151       Component Value Date/Time  WBC 4.8 05/11/2017 0918   WBC 8.5 05/05/2017 1151   RBC 5.23 05/11/2017 0918   RBC 5.24 05/05/2017 1151   HGB 14.9 05/11/2017 0918   HCT 45.1 05/11/2017 0918   PLT 159 05/11/2017 0918   MCV 86.2 05/11/2017 0918   MCH 28.5 05/11/2017 0918   MCH 29.4 05/05/2017 1151   MCHC 33.0 05/11/2017 0918   MCHC 34.8 05/05/2017 1151   RDW 13.3 05/11/2017 0918   LYMPHSABS 1.6 05/11/2017 0918   MONOABS 0.6 05/11/2017 0918   EOSABS 0.2 05/11/2017 0918   BASOSABS 0.0 05/11/2017 0918   -------------------------------  Imaging from last 24 hours (if applicable):  Radiology interpretation: Ct Chest W Contrast  Result Date: 04/18/2017 CLINICAL DATA:  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 CONTRAST:  75 mL Isovue 300 IV COMPARISON:  02/16/2017 FINDINGS: Cardiovascular: The heart is normal in size. No pericardial effusion. No evidence of thoracic aortic aneurysm. Mediastinum/Nodes: No suspicious mediastinal, hilar, or axillary lymphadenopathy. Visualized thyroid is unremarkable. Lungs/Pleura: Progressive  bilateral pulmonary metastases. For example: --2.0 cm left upper lobe nodule (series 7/ image 38), previously 1.6 cm --1.8 cm right upper lobe nodule (series 7/ image 48), previously 1.6 cm --3.1 cm subpleural left lower lobe mass adjacent to the left heart border (series 7/ image 100), previously 2.9 cm --1.6 x 3.6 cm subpleural right lower lobe mass adjacent to the posterior right heart border (series 7/image 101), previously 1.4 x 3.5 cm --1.6 cm left lower lobe nodule (series 7/ image 103), previously 1.4 cm No focal consolidation. No pleural effusion or pneumothorax. Upper Abdomen: Three subcentimeter hypervascular lesions in the right liver (series 2/ images 157 and 175), similar to March 2018, although slight increase in conspicuity is favored to be related to phase of enhancement. Musculoskeletal: Degenerative changes of the midthoracic spine. IMPRESSION: Progressive bilateral pulmonary metastases, as above. Three subcentimeter hypervascular lesions in the right liver, unchanged from March 2018, indeterminate. Electronically Signed   By: Julian Hy M.D.   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 In need of durable intravenous access for chemotherapy administration. EXAM: IMPLANTED PORT A CATH PLACEMENT WITH ULTRASOUND AND FLUOROSCOPIC GUIDANCE COMPARISON:  Chest CT - 04/18/2017 MEDICATIONS: Ancef 2 gm IV; The antibiotic was administered within an appropriate time interval prior to skin puncture. ANESTHESIA/SEDATION: Moderate (conscious) sedation was employed during this procedure. A total of Versed 4 mg and Fentanyl 200 mcg was administered intravenously. Moderate Sedation Time: 23 minutes. The patient's level of consciousness and vital signs were monitored continuously by radiology nursing throughout the procedure under my direct supervision. CONTRAST:  None  FLUOROSCOPY TIME:  12 seconds (3 mGy) COMPLICATIONS: None immediate. PROCEDURE: The procedure, risks, benefits, and alternatives were explained to the patient. Questions regarding the procedure were encouraged and answered. The patient understands and consents to the procedure. The right neck and chest were prepped with chlorhexidine in a sterile fashion, and a sterile drape was applied covering the operative field. Maximum barrier sterile technique with sterile gowns and gloves were used for the procedure. A timeout was performed prior to the initiation of the procedure. Local anesthesia was provided with 1% lidocaine with epinephrine. After creating a small venotomy incision, a micropuncture kit was utilized to access the internal jugular vein. Real-time ultrasound guidance  was utilized for vascular access including the acquisition of a permanent ultrasound image documenting patency of the accessed vessel. The microwire was utilized to measure appropriate catheter length. A subcutaneous port pocket was then created along the upper chest wall utilizing a combination of sharp and blunt dissection. The pocket was irrigated with sterile saline. A single lumen thin power injectable port was chosen for placement. The 8 Fr catheter was tunneled from the port pocket site to the venotomy incision. The port was placed in the pocket. The external catheter was trimmed to appropriate length. At the venotomy, an 8 Fr peel-away sheath was placed over a guidewire under fluoroscopic guidance. The catheter was then placed through the sheath and the sheath was removed. Final catheter positioning was confirmed and documented with a fluoroscopic spot radiograph. The port was accessed with a Huber needle, aspirated and flushed with heparinized saline. The venotomy site was closed with an interrupted 4-0 Vicryl suture. The port pocket incision was closed with interrupted 2-0 Vicryl suture and the skin was opposed with a running  subcuticular 4-0 Vicryl suture. Dermabond and Steri-strips were applied to both incisions. Dressings were placed. The patient tolerated the procedure well without immediate post procedural complication. FINDINGS: After catheter placement, the tip lies within the superior cavoatrial junction. The catheter aspirates and flushes normally and is ready for immediate use. IMPRESSION: Successful placement of a right internal jugular approach power injectable Port-A-Cath. The catheter is ready for immediate use. Electronically Signed   By: Sandi Mariscal M.D.   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 In need of durable intravenous access for chemotherapy administration. EXAM: IMPLANTED PORT A CATH PLACEMENT WITH ULTRASOUND AND FLUOROSCOPIC GUIDANCE COMPARISON:  Chest CT - 04/18/2017 MEDICATIONS: Ancef 2 gm IV; The antibiotic was administered within an appropriate time interval prior to skin puncture. ANESTHESIA/SEDATION: Moderate (conscious) sedation was employed during this procedure. A total of Versed 4 mg and Fentanyl 200 mcg was administered intravenously. Moderate Sedation Time: 23 minutes. The patient's level of consciousness and vital signs were monitored continuously by radiology nursing throughout the procedure under my direct supervision. CONTRAST:  None FLUOROSCOPY TIME:  12 seconds (3 mGy) COMPLICATIONS: None immediate. PROCEDURE: The procedure, risks, benefits, and alternatives were explained to the patient. Questions regarding the procedure were encouraged and answered. The patient understands and consents to the procedure. The right neck and chest were prepped with chlorhexidine in a sterile fashion, and a sterile drape was applied covering the operative field. Maximum barrier sterile technique with sterile gowns and gloves were used for the procedure. A timeout was  performed prior to the initiation of the procedure. Local anesthesia was provided with 1% lidocaine with epinephrine. After creating a small venotomy incision, a micropuncture kit was utilized to access the internal jugular vein. Real-time ultrasound guidance was utilized for vascular access including the acquisition of a permanent ultrasound image documenting patency of the accessed vessel. The microwire was utilized to measure appropriate catheter length. A subcutaneous port pocket was then created along the upper chest wall utilizing a combination of sharp and blunt dissection. The pocket was irrigated with sterile saline. A single lumen thin power injectable port was chosen for placement. The 8 Fr catheter was tunneled from the port pocket site to the venotomy incision. The port was placed in the pocket. The external catheter was trimmed to appropriate length. At the venotomy, an  8 Fr peel-away sheath was placed over a guidewire under fluoroscopic guidance. The catheter was then placed through the sheath and the sheath was removed. Final catheter positioning was confirmed and documented with a fluoroscopic spot radiograph. The port was accessed with a Huber needle, aspirated and flushed with heparinized saline. The venotomy site was closed with an interrupted 4-0 Vicryl suture. The port pocket incision was closed with interrupted 2-0 Vicryl suture and the skin was opposed with a running subcuticular 4-0 Vicryl suture. Dermabond and Steri-strips were applied to both incisions. Dressings were placed. The patient tolerated the procedure well without immediate post procedural complication. FINDINGS: After catheter placement, the tip lies within the superior cavoatrial junction. The catheter aspirates and flushes normally and is ready for immediate use. IMPRESSION: Successful placement of a right internal jugular approach power injectable Port-A-Cath. The catheter is ready for immediate use. Electronically Signed    By: Sandi Mariscal M.D.   On: 05/05/2017 14:48

## 2017-05-17 NOTE — Telephone Encounter (Signed)
Called for chemo follow up. States he had " a little reaction" to cisplatin. States they took care of it- was loopy and they gave him steroids and he was OK.   Has been drinking OK. Slight nausea, but took nausea med and is feeling fine.

## 2017-05-18 ENCOUNTER — Inpatient Hospital Stay: Payer: 59

## 2017-05-18 VITALS — BP 135/80 | HR 72 | Temp 98.5°F | Resp 18

## 2017-05-18 DIAGNOSIS — C080 Malignant neoplasm of submandibular gland: Secondary | ICD-10-CM

## 2017-05-18 DIAGNOSIS — Z5111 Encounter for antineoplastic chemotherapy: Secondary | ICD-10-CM | POA: Diagnosis not present

## 2017-05-18 MED ORDER — PEGFILGRASTIM INJECTION 6 MG/0.6ML ~~LOC~~
6.0000 mg | PREFILLED_SYRINGE | Freq: Once | SUBCUTANEOUS | Status: AC
  Administered 2017-05-18: 6 mg via SUBCUTANEOUS

## 2017-05-18 NOTE — Patient Instructions (Signed)
Pegfilgrastim injection What is this medicine? PEGFILGRASTIM (PEG fil gra stim) is a long-acting granulocyte colony-stimulating factor that stimulates the growth of neutrophils, a type of white blood cell important in the body's fight against infection. It is used to reduce the incidence of fever and infection in patients with certain types of cancer who are receiving chemotherapy that affects the bone marrow, and to increase survival after being exposed to high doses of radiation. This medicine may be used for other purposes; ask your health care provider or pharmacist if you have questions. COMMON BRAND NAME(S): Neulasta What should I tell my health care provider before I take this medicine? They need to know if you have any of these conditions: -kidney disease -latex allergy -ongoing radiation therapy -sickle cell disease -skin reactions to acrylic adhesives (On-Body Injector only) -an unusual or allergic reaction to pegfilgrastim, filgrastim, other medicines, foods, dyes, or preservatives -pregnant or trying to get pregnant -breast-feeding How should I use this medicine? This medicine is for injection under the skin. If you get this medicine at home, you will be taught how to prepare and give the pre-filled syringe or how to use the On-body Injector. Refer to the patient Instructions for Use for detailed instructions. Use exactly as directed. Tell your healthcare provider immediately if you suspect that the On-body Injector may not have performed as intended or if you suspect the use of the On-body Injector resulted in a missed or partial dose. It is important that you put your used needles and syringes in a special sharps container. Do not put them in a trash can. If you do not have a sharps container, call your pharmacist or healthcare provider to get one. Talk to your pediatrician regarding the use of this medicine in children. While this drug may be prescribed for selected conditions,  precautions do apply. Overdosage: If you think you have taken too much of this medicine contact a poison control center or emergency room at once. NOTE: This medicine is only for you. Do not share this medicine with others. What if I miss a dose? It is important not to miss your dose. Call your doctor or health care professional if you miss your dose. If you miss a dose due to an On-body Injector failure or leakage, a new dose should be administered as soon as possible using a single prefilled syringe for manual use. What may interact with this medicine? Interactions have not been studied. Give your health care provider a list of all the medicines, herbs, non-prescription drugs, or dietary supplements you use. Also tell them if you smoke, drink alcohol, or use illegal drugs. Some items may interact with your medicine. This list may not describe all possible interactions. Give your health care provider a list of all the medicines, herbs, non-prescription drugs, or dietary supplements you use. Also tell them if you smoke, drink alcohol, or use illegal drugs. Some items may interact with your medicine. What should I watch for while using this medicine? You may need blood work done while you are taking this medicine. If you are going to need a MRI, CT scan, or other procedure, tell your doctor that you are using this medicine (On-Body Injector only). What side effects may I notice from receiving this medicine? Side effects that you should report to your doctor or health care professional as soon as possible: -allergic reactions like skin rash, itching or hives, swelling of the face, lips, or tongue -dizziness -fever -pain, redness, or irritation at site   where injected -pinpoint red spots on the skin -red or dark-brown urine -shortness of breath or breathing problems -stomach or side pain, or pain at the shoulder -swelling -tiredness -trouble passing urine or change in the amount of urine Side  effects that usually do not require medical attention (report to your doctor or health care professional if they continue or are bothersome): -bone pain -muscle pain This list may not describe all possible side effects. Call your doctor for medical advice about side effects. You may report side effects to FDA at 1-800-FDA-1088. Where should I keep my medicine? Keep out of the reach of children. Store pre-filled syringes in a refrigerator between 2 and 8 degrees C (36 and 46 degrees F). Do not freeze. Keep in carton to protect from light. Throw away this medicine if it is left out of the refrigerator for more than 48 hours. Throw away any unused medicine after the expiration date. NOTE: This sheet is a summary. It may not cover all possible information. If you have questions about this medicine, talk to your doctor, pharmacist, or health care provider.  2018 Elsevier/Gold Standard (2016-04-22 12:58:03)  

## 2017-05-23 ENCOUNTER — Telehealth: Payer: Self-pay

## 2017-05-23 NOTE — Telephone Encounter (Signed)
Patient called and left message that he had a incident this am. He was on the floor and convulsing. Was  coherent but was unable to act for a few minutes.   Called Mr. Towery back and left message that he needs to go to ER to be evaluated. Instructed to call for questions.  Called [place] Crane and told her that he needs to go ER to be evaluated and to not drive. She said she will take him.

## 2017-05-24 ENCOUNTER — Telehealth: Payer: Self-pay

## 2017-05-24 NOTE — Telephone Encounter (Signed)
Called patient regarding below message. He does not want to come in today for appt. He feels better. He thinks that he got so nauseated that that cause the episode yesterday, with the convulsing and being unable to act for a few minutes. Taking anti-nausea medications and feels better today. States that he will call or go to ER if it happens again.

## 2017-05-24 NOTE — Telephone Encounter (Signed)
-----   Message from Heath Lark, MD sent at 05/24/2017  7:46 AM EST ----- Regarding: was he evaluated? Can you call and see if he is OK? I can see him today at 1 pm if needed

## 2017-05-24 NOTE — Telephone Encounter (Signed)
Patient called and left message to call him.   Called back. He is planning a cruise on 4/27 for 10 days. Will he be able to go on the cruise? Should he cancel? He needs to know by the end of January. He can change the dates if needed.  He would like to go back to work if possible. He feels good at this moment. He wants to talk about it with the next appt on 2/4. If he cannot go back to work he would like to apply for disability. He does not have short term or long term disability.

## 2017-05-25 NOTE — Telephone Encounter (Signed)
Called with below message. Verbalized understanding. 

## 2017-05-25 NOTE — Telephone Encounter (Signed)
I cannot predict what happens in the future. He will have a scan after 3 cycles (planned around early March to assess response to treatment). If chemo does not work, we need to discuss more. If chemo works, then no problem for cruise. If I have to make a quick guess he can probably go

## 2017-05-31 ENCOUNTER — Encounter: Payer: Self-pay | Admitting: Hematology and Oncology

## 2017-06-12 ENCOUNTER — Other Ambulatory Visit: Payer: Self-pay | Admitting: Family Medicine

## 2017-06-12 DIAGNOSIS — C080 Malignant neoplasm of submandibular gland: Secondary | ICD-10-CM

## 2017-06-12 DIAGNOSIS — F419 Anxiety disorder, unspecified: Secondary | ICD-10-CM

## 2017-06-12 DIAGNOSIS — F329 Major depressive disorder, single episode, unspecified: Secondary | ICD-10-CM

## 2017-06-12 DIAGNOSIS — M5441 Lumbago with sciatica, right side: Secondary | ICD-10-CM

## 2017-06-12 DIAGNOSIS — F32A Depression, unspecified: Secondary | ICD-10-CM

## 2017-06-12 DIAGNOSIS — M5416 Radiculopathy, lumbar region: Secondary | ICD-10-CM

## 2017-06-13 ENCOUNTER — Inpatient Hospital Stay: Payer: 59

## 2017-06-13 ENCOUNTER — Inpatient Hospital Stay: Payer: 59 | Attending: Hematology and Oncology

## 2017-06-13 ENCOUNTER — Telehealth: Payer: Self-pay | Admitting: Hematology and Oncology

## 2017-06-13 ENCOUNTER — Inpatient Hospital Stay (HOSPITAL_BASED_OUTPATIENT_CLINIC_OR_DEPARTMENT_OTHER): Payer: 59 | Admitting: Hematology and Oncology

## 2017-06-13 DIAGNOSIS — C7802 Secondary malignant neoplasm of left lung: Secondary | ICD-10-CM

## 2017-06-13 DIAGNOSIS — C7801 Secondary malignant neoplasm of right lung: Secondary | ICD-10-CM | POA: Insufficient documentation

## 2017-06-13 DIAGNOSIS — Z5111 Encounter for antineoplastic chemotherapy: Secondary | ICD-10-CM | POA: Insufficient documentation

## 2017-06-13 DIAGNOSIS — Z5189 Encounter for other specified aftercare: Secondary | ICD-10-CM | POA: Diagnosis not present

## 2017-06-13 DIAGNOSIS — C080 Malignant neoplasm of submandibular gland: Secondary | ICD-10-CM

## 2017-06-13 DIAGNOSIS — K5909 Other constipation: Secondary | ICD-10-CM

## 2017-06-13 DIAGNOSIS — C7800 Secondary malignant neoplasm of unspecified lung: Secondary | ICD-10-CM

## 2017-06-13 LAB — CBC WITH DIFFERENTIAL/PLATELET
Basophils Absolute: 0.1 10*3/uL (ref 0.0–0.1)
Basophils Relative: 1 %
EOS ABS: 0.1 10*3/uL (ref 0.0–0.5)
Eosinophils Relative: 1 %
HEMATOCRIT: 38.9 % (ref 38.4–49.9)
HEMOGLOBIN: 12.8 g/dL — AB (ref 13.0–17.1)
LYMPHS ABS: 1 10*3/uL (ref 0.9–3.3)
Lymphocytes Relative: 19 %
MCH: 28.3 pg (ref 27.2–33.4)
MCHC: 32.9 g/dL (ref 32.0–36.0)
MCV: 86.1 fL (ref 79.3–98.0)
Monocytes Absolute: 0.8 10*3/uL (ref 0.1–0.9)
Monocytes Relative: 15 %
NEUTROS ABS: 3.4 10*3/uL (ref 1.5–6.5)
NEUTROS PCT: 64 %
Platelets: 271 10*3/uL (ref 140–400)
RBC: 4.52 MIL/uL (ref 4.20–5.82)
RDW: 14.4 % (ref 11.0–14.6)
WBC: 5.3 10*3/uL (ref 4.0–10.3)

## 2017-06-13 LAB — COMPREHENSIVE METABOLIC PANEL
ALK PHOS: 118 U/L (ref 40–150)
ALT: 25 U/L (ref 0–55)
AST: 22 U/L (ref 5–34)
Albumin: 3.8 g/dL (ref 3.5–5.0)
Anion gap: 7 (ref 3–11)
BILIRUBIN TOTAL: 0.3 mg/dL (ref 0.2–1.2)
BUN: 13 mg/dL (ref 7–26)
CALCIUM: 9.3 mg/dL (ref 8.4–10.4)
CO2: 28 mmol/L (ref 22–29)
CREATININE: 0.83 mg/dL (ref 0.70–1.30)
Chloride: 108 mmol/L (ref 98–109)
GFR calc non Af Amer: >60 mL/min (ref >60)
GLUCOSE: 91 mg/dL (ref 70–140)
Potassium: 3.9 mmol/L (ref 3.5–5.1)
SODIUM: 143 mmol/L (ref 136–145)
Total Protein: 6.8 g/dL (ref 6.4–8.3)

## 2017-06-13 LAB — MAGNESIUM: Magnesium: 2.3 mg/dL (ref 1.5–2.5)

## 2017-06-13 MED ORDER — SODIUM CHLORIDE 0.9 % IV SOLN
46.0000 mg/m2 | Freq: Once | INTRAVENOUS | Status: AC
  Administered 2017-06-13: 100 mg via INTRAVENOUS
  Filled 2017-06-13: qty 100

## 2017-06-13 MED ORDER — DOXORUBICIN HCL CHEMO IV INJECTION 2 MG/ML
50.0000 mg/m2 | Freq: Once | INTRAVENOUS | Status: AC
  Administered 2017-06-13: 108 mg via INTRAVENOUS
  Filled 2017-06-13: qty 54

## 2017-06-13 MED ORDER — POTASSIUM CHLORIDE 2 MEQ/ML IV SOLN
Freq: Once | INTRAVENOUS | Status: AC
  Administered 2017-06-13: 09:00:00 via INTRAVENOUS
  Filled 2017-06-13: qty 10

## 2017-06-13 MED ORDER — CYCLOPHOSPHAMIDE CHEMO INJECTION 1 GM
500.0000 mg/m2 | Freq: Once | INTRAMUSCULAR | Status: AC
  Administered 2017-06-13: 1080 mg via INTRAVENOUS
  Filled 2017-06-13: qty 54

## 2017-06-13 MED ORDER — SODIUM CHLORIDE 0.9% FLUSH
10.0000 mL | INTRAVENOUS | Status: DC | PRN
  Administered 2017-06-13: 10 mL
  Filled 2017-06-13: qty 10

## 2017-06-13 MED ORDER — SODIUM CHLORIDE 0.9% FLUSH
10.0000 mL | Freq: Once | INTRAVENOUS | Status: AC
  Administered 2017-06-13: 10 mL
  Filled 2017-06-13: qty 10

## 2017-06-13 MED ORDER — SODIUM CHLORIDE 0.9 % IV SOLN
Freq: Once | INTRAVENOUS | Status: AC
  Administered 2017-06-13: 12:00:00 via INTRAVENOUS
  Filled 2017-06-13: qty 5

## 2017-06-13 MED ORDER — PEGFILGRASTIM 6 MG/0.6ML ~~LOC~~ PSKT
PREFILLED_SYRINGE | SUBCUTANEOUS | Status: AC
  Filled 2017-06-13: qty 0.6

## 2017-06-13 MED ORDER — PEGFILGRASTIM 6 MG/0.6ML ~~LOC~~ PSKT
6.0000 mg | PREFILLED_SYRINGE | Freq: Once | SUBCUTANEOUS | Status: AC
  Administered 2017-06-13: 6 mg via SUBCUTANEOUS

## 2017-06-13 MED ORDER — SODIUM CHLORIDE 0.9 % IV SOLN
Freq: Once | INTRAVENOUS | Status: AC
  Administered 2017-06-13: 09:00:00 via INTRAVENOUS

## 2017-06-13 MED ORDER — HEPARIN SOD (PORK) LOCK FLUSH 100 UNIT/ML IV SOLN
500.0000 [IU] | Freq: Once | INTRAVENOUS | Status: AC | PRN
  Administered 2017-06-13: 500 [IU]
  Filled 2017-06-13: qty 5

## 2017-06-13 MED ORDER — PALONOSETRON HCL INJECTION 0.25 MG/5ML
0.2500 mg | Freq: Once | INTRAVENOUS | Status: AC
  Administered 2017-06-13: 0.25 mg via INTRAVENOUS

## 2017-06-13 MED ORDER — FAMOTIDINE IN NACL 20-0.9 MG/50ML-% IV SOLN
INTRAVENOUS | Status: AC
  Filled 2017-06-13: qty 50

## 2017-06-13 MED ORDER — PALONOSETRON HCL INJECTION 0.25 MG/5ML
INTRAVENOUS | Status: AC
  Filled 2017-06-13: qty 5

## 2017-06-13 MED ORDER — FAMOTIDINE IN NACL 20-0.9 MG/50ML-% IV SOLN
20.0000 mg | Freq: Once | INTRAVENOUS | Status: AC
Start: 2017-06-13 — End: 2017-06-13
  Administered 2017-06-13: 20 mg via INTRAVENOUS

## 2017-06-13 NOTE — Telephone Encounter (Signed)
Scheduled appts per 2/4 los - patient did not want avs or calendar.

## 2017-06-13 NOTE — Patient Instructions (Signed)
[place] Discharge Instructions for Patients Receiving Chemotherapy  Today you received the following chemotherapy agents:  Adriamycin, Cytoxan, and Cisplatin.  To help prevent nausea and vomiting after your treatment, we encourage you to take your nausea medication as directed.   If you develop nausea and vomiting that is not controlled by your nausea medication, call the clinic.   BELOW ARE SYMPTOMS THAT SHOULD BE REPORTED IMMEDIATELY:  *FEVER GREATER THAN 100.5 F  *CHILLS WITH OR WITHOUT FEVER  NAUSEA AND VOMITING THAT IS NOT CONTROLLED WITH YOUR NAUSEA MEDICATION  *UNUSUAL SHORTNESS OF BREATH  *UNUSUAL BRUISING OR BLEEDING  TENDERNESS IN MOUTH AND THROAT WITH OR WITHOUT PRESENCE OF ULCERS  *URINARY PROBLEMS  *BOWEL PROBLEMS  UNUSUAL RASH Items with * indicate a potential emergency and should be followed up as soon as possible.  Feel free to call the clinic should you have any questions or concerns. The clinic phone number is [phone].  Please show the [place] at check-in to the Emergency Department and triage nurse.  Doxorubicin injection What is this medicine? DOXORUBICIN (dox oh ROO bi sin) is a chemotherapy drug. It is used to treat many kinds of cancer like leukemia, lymphoma, neuroblastoma, sarcoma, and Wilms' tumor. It is also used to treat bladder cancer, breast cancer, lung cancer, ovarian cancer, stomach cancer, and thyroid cancer. This medicine may be used for other purposes; ask your health care provider or pharmacist if you have questions. COMMON BRAND NAME(S): Adriamycin, Adriamycin PFS, Adriamycin RDF, Rubex What should I tell my health care provider before I take this medicine? They need to know if you have any of these conditions: -heart disease -history of low blood counts caused by a medicine -liver disease -recent or ongoing radiation therapy -an unusual or allergic reaction to doxorubicin, other chemotherapy agents,  other medicines, foods, dyes, or preservatives -pregnant or trying to get pregnant -breast-feeding How should I use this medicine? This drug is given as an infusion into a vein. It is administered in a hospital or clinic by a specially trained health care professional. If you have pain, swelling, burning or any unusual feeling around the site of your injection, tell your health care professional right away. Talk to your pediatrician regarding the use of this medicine in children. Special care may be needed. Overdosage: If you think you have taken too much of this medicine contact a poison control center or emergency room at once. NOTE: This medicine is only for you. Do not share this medicine with others. What if I miss a dose? It is important not to miss your dose. Call your doctor or health care professional if you are unable to keep an appointment. What may interact with this medicine? This medicine may interact with the following medications: -6-mercaptopurine -paclitaxel -phenytoin -St. John's Wort -trastuzumab -verapamil This list may not describe all possible interactions. Give your health care provider a list of all the medicines, herbs, non-prescription drugs, or dietary supplements you use. Also tell them if you smoke, drink alcohol, or use illegal drugs. Some items may interact with your medicine. What should I watch for while using this medicine? This drug may make you feel generally unwell. This is not uncommon, as chemotherapy can affect healthy cells as well as cancer cells. Report any side effects. Continue your course of treatment even though you feel ill unless your doctor tells you to stop. There is a maximum amount of this medicine you should receive throughout your life. The amount  depends on the medical condition being treated and your overall health. Your doctor will watch how much of this medicine you receive in your lifetime. Tell your doctor if you have taken this  medicine before. You may need blood work done while you are taking this medicine. Your urine may turn red for a few days after your dose. This is not blood. If your urine is dark or brown, call your doctor. In some cases, you may be given additional medicines to help with side effects. Follow all directions for their use. Call your doctor or health care professional for advice if you get a fever, chills or sore throat, or other symptoms of a cold or flu. Do not treat yourself. This drug decreases your body's ability to fight infections. Try to avoid being around people who are sick. This medicine may increase your risk to bruise or bleed. Call your doctor or health care professional if you notice any unusual bleeding. Talk to your doctor about your risk of cancer. You may be more at risk for certain types of cancers if you take this medicine. Do not become pregnant while taking this medicine or for 6 months after stopping it. Women should inform their doctor if they wish to become pregnant or think they might be pregnant. Men should not father a child while taking this medicine and for 6 months after stopping it. There is a potential for serious side effects to an unborn child. Talk to your health care professional or pharmacist for more information. Do not breast-feed an infant while taking this medicine. This medicine has caused ovarian failure in some women and reduced sperm counts in some men This medicine may interfere with the ability to have a child. Talk with your doctor or health care professional if you are concerned about your fertility. What side effects may I notice from receiving this medicine? Side effects that you should report to your doctor or health care professional as soon as possible: -allergic reactions like skin rash, itching or hives, swelling of the face, lips, or tongue -breathing problems -chest pain -fast or irregular heartbeat -low blood counts - this medicine may  decrease the number of white blood cells, red blood cells and platelets. You may be at increased risk for infections and bleeding. -pain, redness, or irritation at site where injected -signs of infection - fever or chills, cough, sore throat, pain or difficulty passing urine -signs of decreased platelets or bleeding - bruising, pinpoint red spots on the skin, black, tarry stools, blood in the urine -swelling of the ankles, feet, hands -tiredness -weakness Side effects that usually do not require medical attention (report to your doctor or health care professional if they continue or are bothersome): -diarrhea -hair loss -mouth sores -nail discoloration or damage -nausea -red colored urine -vomiting This list may not describe all possible side effects. Call your doctor for medical advice about side effects. You may report side effects to FDA at 1-800-FDA-1088. Where should I keep my medicine? This drug is given in a hospital or clinic and will not be stored at home. NOTE: This sheet is a summary. It may not cover all possible information. If you have questions about this medicine, talk to your doctor, pharmacist, or health care provider.  2018 Elsevier/Gold Standard (2015-06-23 11:28:51)  Cyclophosphamide injection What is this medicine? CYCLOPHOSPHAMIDE (sye kloe FOSS fa mide) is a chemotherapy drug. It slows the growth of cancer cells. This medicine is used to treat many types of cancer  like lymphoma, myeloma, leukemia, breast cancer, and ovarian cancer, to name a few. This medicine may be used for other purposes; ask your health care provider or pharmacist if you have questions. COMMON BRAND NAME(S): Cytoxan, Neosar What should I tell my health care provider before I take this medicine? They need to know if you have any of these conditions: -blood disorders -history of other chemotherapy -infection -kidney disease -liver disease -recent or ongoing radiation therapy -tumors in the  bone marrow -an unusual or allergic reaction to cyclophosphamide, other chemotherapy, other medicines, foods, dyes, or preservatives -pregnant or trying to get pregnant -breast-feeding How should I use this medicine? This drug is usually given as an injection into a vein or muscle or by infusion into a vein. It is administered in a hospital or clinic by a specially trained health care professional. Talk to your pediatrician regarding the use of this medicine in children. Special care may be needed. Overdosage: If you think you have taken too much of this medicine contact a poison control center or emergency room at once. NOTE: This medicine is only for you. Do not share this medicine with others. What if I miss a dose? It is important not to miss your dose. Call your doctor or health care professional if you are unable to keep an appointment. What may interact with this medicine? This medicine may interact with the following medications: -amiodarone -amphotericin B -azathioprine -certain antiviral medicines for HIV or AIDS such as protease inhibitors (e.g., indinavir, ritonavir) and zidovudine -certain blood pressure medications such as benazepril, captopril, enalapril, fosinopril, lisinopril, moexipril, monopril, perindopril, quinapril, ramipril, trandolapril -certain cancer medications such as anthracyclines (e.g., daunorubicin, doxorubicin), busulfan, cytarabine, paclitaxel, pentostatin, tamoxifen, trastuzumab -certain diuretics such as chlorothiazide, chlorthalidone, hydrochlorothiazide, indapamide, metolazone -certain medicines that treat or prevent blood clots like warfarin -certain muscle relaxants such as succinylcholine -cyclosporine -etanercept -indomethacin -medicines to increase blood counts like filgrastim, pegfilgrastim, sargramostim -medicines used as general anesthesia -metronidazole -natalizumab This list may not describe all possible interactions. Give your health care  provider a list of all the medicines, herbs, non-prescription drugs, or dietary supplements you use. Also tell them if you smoke, drink alcohol, or use illegal drugs. Some items may interact with your medicine. What should I watch for while using this medicine? Visit your doctor for checks on your progress. This drug may make you feel generally unwell. This is not uncommon, as chemotherapy can affect healthy cells as well as cancer cells. Report any side effects. Continue your course of treatment even though you feel ill unless your doctor tells you to stop. Drink water or other fluids as directed. Urinate often, even at night. In some cases, you may be given additional medicines to help with side effects. Follow all directions for their use. Call your doctor or health care professional for advice if you get a fever, chills or sore throat, or other symptoms of a cold or flu. Do not treat yourself. This drug decreases your body's ability to fight infections. Try to avoid being around people who are sick. This medicine may increase your risk to bruise or bleed. Call your doctor or health care professional if you notice any unusual bleeding. Be careful brushing and flossing your teeth or using a toothpick because you may get an infection or bleed more easily. If you have any dental work done, tell your dentist you are receiving this medicine. You may get drowsy or dizzy. Do not drive, use machinery, or do anything that needs  mental alertness until you know how this medicine affects you. Do not become pregnant while taking this medicine or for 1 year after stopping it. Women should inform their doctor if they wish to become pregnant or think they might be pregnant. Men should not father a child while taking this medicine and for 4 months after stopping it. There is a potential for serious side effects to an unborn child. Talk to your health care professional or pharmacist for more information. Do not  breast-feed an infant while taking this medicine. This medicine may interfere with the ability to have a child. This medicine has caused ovarian failure in some women. This medicine has caused reduced sperm counts in some men. You should talk with your doctor or health care professional if you are concerned about your fertility. If you are going to have surgery, tell your doctor or health care professional that you have taken this medicine. What side effects may I notice from receiving this medicine? Side effects that you should report to your doctor or health care professional as soon as possible: -allergic reactions like skin rash, itching or hives, swelling of the face, lips, or tongue -low blood counts - this medicine may decrease the number of white blood cells, red blood cells and platelets. You may be at increased risk for infections and bleeding. -signs of infection - fever or chills, cough, sore throat, pain or difficulty passing urine -signs of decreased platelets or bleeding - bruising, pinpoint red spots on the skin, black, tarry stools, blood in the urine -signs of decreased red blood cells - unusually weak or tired, fainting spells, lightheadedness -breathing problems -dark urine -dizziness -palpitations -swelling of the ankles, feet, hands -trouble passing urine or change in the amount of urine -weight gain -yellowing of the eyes or skin Side effects that usually do not require medical attention (report to your doctor or health care professional if they continue or are bothersome): -changes in nail or skin color -hair loss -missed menstrual periods -mouth sores -nausea, vomiting This list may not describe all possible side effects. Call your doctor for medical advice about side effects. You may report side effects to FDA at 1-800-FDA-1088. Where should I keep my medicine? This drug is given in a hospital or clinic and will not be stored at home. NOTE: This sheet is a  summary. It may not cover all possible information. If you have questions about this medicine, talk to your doctor, pharmacist, or health care provider.  2018 Elsevier/Gold Standard (2012-03-10 16:22:58)  Cisplatin injection What is this medicine? CISPLATIN (SIS pla tin) is a chemotherapy drug. It targets fast dividing cells, like cancer cells, and causes these cells to die. This medicine is used to treat many types of cancer like bladder, ovarian, and testicular cancers. This medicine may be used for other purposes; ask your health care provider or pharmacist if you have questions. COMMON BRAND NAME(S): Platinol, Platinol -AQ What should I tell my health care provider before I take this medicine? They need to know if you have any of these conditions: -blood disorders -hearing problems -kidney disease -recent or ongoing radiation therapy -an unusual or allergic reaction to cisplatin, carboplatin, other chemotherapy, other medicines, foods, dyes, or preservatives -pregnant or trying to get pregnant -breast-feeding How should I use this medicine? This drug is given as an infusion into a vein. It is administered in a hospital or clinic by a specially trained health care professional. Talk to your pediatrician regarding the use of  this medicine in children. Special care may be needed. Overdosage: If you think you have taken too much of this medicine contact a poison control center or emergency room at once. NOTE: This medicine is only for you. Do not share this medicine with others. What if I miss a dose? It is important not to miss a dose. Call your doctor or health care professional if you are unable to keep an appointment. What may interact with this medicine? -dofetilide -foscarnet -medicines for seizures -medicines to increase blood counts like filgrastim, pegfilgrastim, sargramostim -probenecid -pyridoxine used with altretamine -rituximab -some antibiotics like amikacin,  gentamicin, neomycin, polymyxin B, streptomycin, tobramycin -sulfinpyrazone -vaccines -zalcitabine Talk to your doctor or health care professional before taking any of these medicines: -acetaminophen -aspirin -ibuprofen -ketoprofen -naproxen This list may not describe all possible interactions. Give your health care provider a list of all the medicines, herbs, non-prescription drugs, or dietary supplements you use. Also tell them if you smoke, drink alcohol, or use illegal drugs. Some items may interact with your medicine. What should I watch for while using this medicine? Your condition will be monitored carefully while you are receiving this medicine. You will need important blood work done while you are taking this medicine. This drug may make you feel generally unwell. This is not uncommon, as chemotherapy can affect healthy cells as well as cancer cells. Report any side effects. Continue your course of treatment even though you feel ill unless your doctor tells you to stop. In some cases, you may be given additional medicines to help with side effects. Follow all directions for their use. Call your doctor or health care professional for advice if you get a fever, chills or sore throat, or other symptoms of a cold or flu. Do not treat yourself. This drug decreases your body's ability to fight infections. Try to avoid being around people who are sick. This medicine may increase your risk to bruise or bleed. Call your doctor or health care professional if you notice any unusual bleeding. Be careful brushing and flossing your teeth or using a toothpick because you may get an infection or bleed more easily. If you have any dental work done, tell your dentist you are receiving this medicine. Avoid taking products that contain aspirin, acetaminophen, ibuprofen, naproxen, or ketoprofen unless instructed by your doctor. These medicines may hide a fever. Do not become pregnant while taking this  medicine. Women should inform their doctor if they wish to become pregnant or think they might be pregnant. There is a potential for serious side effects to an unborn child. Talk to your health care professional or pharmacist for more information. Do not breast-feed an infant while taking this medicine. Drink fluids as directed while you are taking this medicine. This will help protect your kidneys. Call your doctor or health care professional if you get diarrhea. Do not treat yourself. What side effects may I notice from receiving this medicine? Side effects that you should report to your doctor or health care professional as soon as possible: -allergic reactions like skin rash, itching or hives, swelling of the face, lips, or tongue -signs of infection - fever or chills, cough, sore throat, pain or difficulty passing urine -signs of decreased platelets or bleeding - bruising, pinpoint red spots on the skin, black, tarry stools, nosebleeds -signs of decreased red blood cells - unusually weak or tired, fainting spells, lightheadedness -breathing problems -changes in hearing -gout pain -low blood counts - This drug may decrease the  number of white blood cells, red blood cells and platelets. You may be at increased risk for infections and bleeding. -nausea and vomiting -pain, swelling, redness or irritation at the injection site -pain, tingling, numbness in the hands or feet -problems with balance, movement -trouble passing urine or change in the amount of urine Side effects that usually do not require medical attention (report to your doctor or health care professional if they continue or are bothersome): -changes in vision -loss of appetite -metallic taste in the mouth or changes in taste This list may not describe all possible side effects. Call your doctor for medical advice about side effects. You may report side effects to FDA at 1-800-FDA-1088. Where should I keep my medicine? This drug  is given in a hospital or clinic and will not be stored at home. NOTE: This sheet is a summary. It may not cover all possible information. If you have questions about this medicine, talk to your doctor, pharmacist, or health care provider.  2018 Elsevier/Gold Standard (2007-08-01 14:40:54)

## 2017-06-14 ENCOUNTER — Encounter: Payer: Self-pay | Admitting: Hematology and Oncology

## 2017-06-14 DIAGNOSIS — K5909 Other constipation: Secondary | ICD-10-CM | POA: Insufficient documentation

## 2017-06-14 NOTE — Assessment & Plan Note (Addendum)
The patient had a bizarre reaction that causes dystonic reaction manifesting like seizure activity that resolved spontaneously I do not believe this is related to chemotherapy We will proceed with treatment without dose adjustment I plan to repeat imaging study after 3 cycles of chemotherapy

## 2017-06-14 NOTE — Progress Notes (Signed)
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 No acute intracranial abnormality. 2. Swollen right submandibular gland containing prominent calcifications consistent with submandibular gland stones. Submandibular gland enlargement is most likely inflammatory. Malignancy cannot be entirely excluded .      07/12/2014 Pathology Results    Submandibular gland, Right - ADENOID CYSTIC CARCINOMA, GRADE I OF III, SEE COMMENT. - POSITIVE FOR PERINEURAL INVASION. - TUMOR INVOLVES SURGICAL EXCISIONAL MARGIN - SEE TUMOR SYNOPTIC TEMPLATE BELOW. Microscopic Comment ONCOLOGY TABLE - SALIVARY GLAND 1. Specimen: Right submandibular gland 2. Procedure: Excision. 3. Tumor site and laterality: Right submandibular gland 4. Tumor focality: Unifocal 5. Maximum tumor size (cm): 3.8 cm 6. Histologic type: Adenoid cystic carcinoma. 7. Grade: I of III 8. Margins: Positive Distance of tumor from closest margin: Present at margin 9. Perineural invasion: Present 10. Lymph-Vascular invasion: Absent. 11. Lymph nodes: # examined - 0; # positive - N/A; extracapsular extension - N/A 12. TNM code: pT2, pNX, pMX      07/12/2014 Surgery    Right submandibular gland excision       07/24/2014 Miscellaneous    The patient was seen by radiation oncologist locally who offered him adjuvant radiation treatment.  The patient obtained second opinion at [facility] who recommended against adjuvant radiation treatment.  The patient finally decided not proceed with radiation therapy      07/29/2014 Imaging    No findings specific for metastatic disease in the chest or  abdomen. 4 mm subpleural nodule in the medial left lower lobe, likely reflecting a benign subpleural lymph node, although technically indeterminate. Attention on follow-up is suggested.       07/30/2014 Imaging    MR face: Postop resection of right submandibular gland for adenoid cystic carcinoma. No evidence of perineural spread. No skullbase or cavernous sinus involvement.       08/12/2014 Surgery    Right level I, 2 neck selective dissection       08/12/2014 Pathology Results    Lymph nodes, radical neck dissection, limited right neck - BENIGN SALIVARY GLAND TISSUE; NEGATIVE FOR ATYPIA OR MALIGNANCY. - BENIGN SKIN WITH DERMAL AND SUBCUTANEOUS SCAR AND INFLAMMATORY TISSUE REACTION. - TWO INTRA-SALIVARY GLAND LYMPH NODES, NEGATIVE FOR TUMOR (0/2) - ONE LEVEL ONE-A LYMPH NODE, NEGATIVE FOR TUMOR (0/1). - SIX LEVEL TWO LYMPH NODES, NEGATIVE FOR TUMOR (0/6)      04/07/2015 Imaging    MR neck: 1. Status post resection of right submandibular gland. 2. No evidence for perineural spread or tumor recurrence. 3. Stable central and bilateral foraminal narrowing at C6-7.      09/25/2015 Imaging    MR neck: Stable exam. No evidence of residual or recurrent right submandibular carcinoma.      07/08/2016 Procedure    He underwent repeat excision of right submandibular mass: A 2 x 3 cm ellipse was made around mass incorporating the scar. This was carried down through skin and subcutaneous fat and the dissection was completed at the level of the sternomastoid muscle. Examination of the specimen revealed complete removal. This was sent for pathologic interpretation.  07/09/2016 Pathology Results    Adenoid cystic carcinoma (margins free of neoplasm)      08/06/2016 Imaging    CT scan of chest, abdomen and pelvis: 1. There are now multiple pulmonary nodules consistent with metastatic disease to the lungs. One nodule was present previously, measuring 4 mm, noted along the pleural margin of  the left lower lobe. This is most likely a benign granuloma. The remaining visualized nodules are new consistent with metastatic disease, given the patient's history. 2. No other findings of metastatic disease within the chest. 3. No evidence of metastatic disease in the abdomen or pelvis. 4. No acute findings.      08/06/2016 Imaging    Ct neck: 1. Multiple new bilateral apical pulmonary nodules measuring up to 1.3 cm and concerning for metastatic disease. 2. 22 x 7 mm right level III lymph node, enlarged from 2016 and indeterminate. 3. Prior right submandibular gland resection without evidence of locally recurrent mass.      08/06/2016 Imaging    MR face: Prior right submandibular gland resection without evidence of recurrent tumor      10/12/2016 Imaging    There is no new mediastinal or hilar lymphadenopathy.  The heart is normal in size and there is no pericardial or pleural effusion.  Bilateral pulmonary nodules have slightly increased in size, suspicious for metastatic disease. For instance a 14 mm nodule on image 39 previously measured 12 mm. The largest nodule in the right lower lobe measures 24 mm in diameter.  Limited imaging through the upper abdomen shows normal adrenal glands.      10/12/2016 Imaging    CT neck 1.No evidence for primary or nodal recurrence in the neck. 2.Bilateral pulmonary nodules.      02/16/2017 Imaging    Ct chest showed enlarging pulmonary metastases.      02/16/2017 Imaging    1. Unchanged examination of the neck with stable size of right level 3 and left level IIa lymph nodes. 2. Please see dedicated report for concomitant chest CT for findings below thoracic inlet.      03/25/2017 Procedure    Technically successful CT guided core needle core biopsy of indeterminate enlarging left lower lobe pulmonary nodule.      03/25/2017 Pathology Results    Lung, biopsy, Left lower lobe - FINDINGS CONSISTENT WITH METASTATIC ADENOID CYSTIC CARCINOMA,  SEE COMMENT. Microscopic Comment There is a small focus of tumor with similar morphology to the patient's prior adenoid cystic carcinoma of the submandibular gland ([id number], reviewed).       04/18/2017 Imaging    Progressive bilateral pulmonary metastases, as above. Three subcentimeter hypervascular lesions in the right liver, unchanged from March 2018, indeterminate.      05/05/2017 Procedure    Successful placement of a right internal jugular approach power injectable Port-A-Cath. The catheter is ready for immediate use      05/16/2017 -  Chemotherapy    The patient had chemotherapy with cisplatin, cytoxan and doxorubicin       INTERVAL HISTORY: Please see below for problem oriented charting. He returns for cycle 2 of treatment With cycle 1 of therapy, he developed dystonic reaction with profound weakness and jerking movement but no loss of consciousness He also had mild constipation No recent neuropathy Denies chest pain or shortness of breath No recent infection  REVIEW OF SYSTEMS:   Constitutional: Denies fevers, chills or abnormal weight loss Eyes: Denies blurriness of vision Ears, nose, mouth, throat, and face: Denies mucositis or sore  throat Respiratory: Denies cough, dyspnea or wheezes Cardiovascular: Denies palpitation, chest discomfort or lower extremity swelling Skin: Denies abnormal skin rashes Lymphatics: Denies new lymphadenopathy or easy bruising Behavioral/Psych: Mood is stable, no new changes  All other systems were reviewed with the patient and are negative.  I have reviewed the past medical history, past surgical history, social history and family history with the patient and they are unchanged from previous note.  ALLERGIES:  is allergic to hydrocodone.  MEDICATIONS:  Current Outpatient Medications  Medication Sig Dispense Refill  . DULoxetine (CYMBALTA) 60 MG capsule Take 1 capsule (60 mg total) by mouth daily. 30 capsule 0  . famotidine (PEPCID  AC) 10 MG tablet Take 10 mg daily as needed by mouth for heartburn or indigestion.    Marland Kitchen ibuprofen (ADVIL,MOTRIN) 200 MG tablet Take 200-800 mg 2 (two) times daily as needed by mouth for moderate pain.    Marland Kitchen lidocaine-prilocaine (EMLA) cream Apply 1 application topically as needed. 30 g 6  . loratadine (CLARITIN) 10 MG tablet Take 10 mg by mouth daily as needed for allergies.     . Multiple Vitamin (MULTIVITAMIN WITH MINERALS) TABS tablet Take 1 tablet daily by mouth.    . naproxen sodium (ALEVE) 220 MG tablet Take 440 mg daily as needed by mouth (pain).    . ondansetron (ZOFRAN) 8 MG tablet Take 1 tablet (8 mg total) by mouth 2 (two) times daily as needed. Start on the third day after chemotherapy. 30 tablet 1  . prochlorperazine (COMPAZINE) 10 MG tablet Take 1 tablet (10 mg total) by mouth every 6 (six) hours as needed (Nausea or vomiting). 30 tablet 1   No current facility-administered medications for this visit.     PHYSICAL EXAMINATION: ECOG PERFORMANCE STATUS: 1 - Symptomatic but completely ambulatory  Vitals:   06/13/17 0831  BP: 133/72  Pulse: 75  Resp: 18  Temp: 98.5 F (36.9 C)  SpO2: 99%   Filed Weights   06/13/17 0831  Weight: 208 lb 11.2 oz (94.7 kg)    GENERAL:alert, no distress and comfortable SKIN: skin color, texture, turgor are normal, no rashes or significant lesions EYES: normal, Conjunctiva are pink and non-injected, sclera clear OROPHARYNX:no exudate, no erythema and lips, buccal mucosa, and tongue normal  NECK: supple, thyroid normal size, non-tender, without nodularity LYMPH:  no palpable lymphadenopathy in the cervical, axillary or inguinal LUNGS: clear to auscultation and percussion with normal breathing effort HEART: regular rate & rhythm and no murmurs and no lower extremity edema ABDOMEN:abdomen soft, non-tender and normal bowel sounds Musculoskeletal:no cyanosis of digits and no clubbing  NEURO: alert & oriented x 3 with fluent speech, no focal  motor/sensory deficits  LABORATORY DATA:  I have reviewed the data as listed    Component Value Date/Time   NA 143 06/13/2017 0800   NA 142 05/11/2017 0917   K 3.9 06/13/2017 0800   K 4.1 05/11/2017 0917   CL 108 06/13/2017 0800   CO2 28 06/13/2017 0800   CO2 26 05/11/2017 0917   GLUCOSE 91 06/13/2017 0800   GLUCOSE 82 05/11/2017 0917   BUN 13 06/13/2017 0800   BUN 14.2 05/11/2017 0917   CREATININE 0.83 06/13/2017 0800   CREATININE 0.9 05/11/2017 0917   CALCIUM 9.3 06/13/2017 0800   CALCIUM 9.6 05/11/2017 0917   PROT 6.8 06/13/2017 0800   PROT 7.7 05/11/2017 0917   ALBUMIN 3.8 06/13/2017 0800   ALBUMIN 4.2 05/11/2017 0917   AST 22 06/13/2017 0800   AST  53 (H) 05/11/2017 0917   ALT 25 06/13/2017 0800   ALT 61 (H) 05/11/2017 0917   ALKPHOS 118 06/13/2017 0800   ALKPHOS 114 05/11/2017 0917   BILITOT 0.3 06/13/2017 0800   BILITOT 0.48 05/11/2017 0917   GFRNONAA >60 06/13/2017 0800   GFRAA >60 06/13/2017 0800    No results found for: SPEP, UPEP  Lab Results  Component Value Date   WBC 5.3 06/13/2017   NEUTROABS 3.4 06/13/2017   HGB 12.8 (L) 06/13/2017   HCT 38.9 06/13/2017   MCV 86.1 06/13/2017   PLT 271 06/13/2017      Chemistry      Component Value Date/Time   NA 143 06/13/2017 0800   NA 142 05/11/2017 0917   K 3.9 06/13/2017 0800   K 4.1 05/11/2017 0917   CL 108 06/13/2017 0800   CO2 28 06/13/2017 0800   CO2 26 05/11/2017 0917   BUN 13 06/13/2017 0800   BUN 14.2 05/11/2017 0917   CREATININE 0.83 06/13/2017 0800   CREATININE 0.9 05/11/2017 0917      Component Value Date/Time   CALCIUM 9.3 06/13/2017 0800   CALCIUM 9.6 05/11/2017 0917   ALKPHOS 118 06/13/2017 0800   ALKPHOS 114 05/11/2017 0917   AST 22 06/13/2017 0800   AST 53 (H) 05/11/2017 0917   ALT 25 06/13/2017 0800   ALT 61 (H) 05/11/2017 0917   BILITOT 0.3 06/13/2017 0800   BILITOT 0.48 05/11/2017 0917      ASSESSMENT & PLAN:  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[place]) The  patient had a bizarre reaction that causes dystonic reaction manifesting like seizure activity that resolved spontaneously I do not believe this is related to chemotherapy We will proceed with treatment without dose adjustment I plan to repeat imaging study after 3 cycles of chemotherapy  Metastasis to lung [facility]) He is mildly symptomatic with atypical chest discomfort He denies cough or shortness of breath I plan to repeat imaging study after 3 cycles of treatment  Other constipation This is likely induced by side effects of treatment I recommend over-the-counter laxatives   No orders of the defined types were placed in this encounter.  All questions were answered. The patient knows to call the clinic with any problems, questions or concerns. No barriers to learning was detected. I spent 15 minutes counseling the patient face to face. The total time spent in the appointment was 20 minutes and more than 50% was on counseling and review of test results     Heath Lark, MD 06/14/2017 10:33 AM

## 2017-06-14 NOTE — Assessment & Plan Note (Signed)
He is mildly symptomatic with atypical chest discomfort He denies cough or shortness of breath I plan to repeat imaging study after 3 cycles of treatment

## 2017-06-14 NOTE — Assessment & Plan Note (Signed)
This is likely induced by side effects of treatment I recommend over-the-counter laxatives

## 2017-06-15 ENCOUNTER — Other Ambulatory Visit: Payer: Self-pay | Admitting: Family Medicine

## 2017-06-15 DIAGNOSIS — M5441 Lumbago with sciatica, right side: Secondary | ICD-10-CM

## 2017-06-15 DIAGNOSIS — F329 Major depressive disorder, single episode, unspecified: Secondary | ICD-10-CM

## 2017-06-15 DIAGNOSIS — C080 Malignant neoplasm of submandibular gland: Secondary | ICD-10-CM

## 2017-06-15 DIAGNOSIS — F32A Depression, unspecified: Secondary | ICD-10-CM

## 2017-06-15 DIAGNOSIS — M5416 Radiculopathy, lumbar region: Secondary | ICD-10-CM

## 2017-06-15 DIAGNOSIS — F419 Anxiety disorder, unspecified: Secondary | ICD-10-CM

## 2017-06-23 ENCOUNTER — Other Ambulatory Visit: Payer: Self-pay | Admitting: Hematology and Oncology

## 2017-06-23 DIAGNOSIS — C080 Malignant neoplasm of submandibular gland: Secondary | ICD-10-CM

## 2017-07-11 ENCOUNTER — Inpatient Hospital Stay: Payer: 59

## 2017-07-11 ENCOUNTER — Other Ambulatory Visit: Payer: Self-pay | Admitting: Hematology and Oncology

## 2017-07-11 ENCOUNTER — Inpatient Hospital Stay: Payer: 59 | Attending: Hematology and Oncology | Admitting: Hematology and Oncology

## 2017-07-11 ENCOUNTER — Encounter: Payer: Self-pay | Admitting: Hematology and Oncology

## 2017-07-11 VITALS — BP 115/80 | HR 73 | Temp 98.0°F | Resp 18 | Ht 72.0 in | Wt 204.6 lb

## 2017-07-11 VITALS — BP 110/81 | HR 68 | Temp 97.8°F | Resp 18

## 2017-07-11 DIAGNOSIS — C7800 Secondary malignant neoplasm of unspecified lung: Secondary | ICD-10-CM | POA: Insufficient documentation

## 2017-07-11 DIAGNOSIS — C080 Malignant neoplasm of submandibular gland: Secondary | ICD-10-CM | POA: Diagnosis not present

## 2017-07-11 DIAGNOSIS — R110 Nausea: Secondary | ICD-10-CM | POA: Diagnosis not present

## 2017-07-11 DIAGNOSIS — Z5111 Encounter for antineoplastic chemotherapy: Secondary | ICD-10-CM | POA: Diagnosis present

## 2017-07-11 DIAGNOSIS — Z5189 Encounter for other specified aftercare: Secondary | ICD-10-CM | POA: Insufficient documentation

## 2017-07-11 DIAGNOSIS — T451X5A Adverse effect of antineoplastic and immunosuppressive drugs, initial encounter: Secondary | ICD-10-CM

## 2017-07-11 DIAGNOSIS — G620 Drug-induced polyneuropathy: Secondary | ICD-10-CM | POA: Insufficient documentation

## 2017-07-11 LAB — CBC WITH DIFFERENTIAL/PLATELET
Basophils Absolute: 0 10*3/uL (ref 0.0–0.1)
Basophils Relative: 0 %
EOS PCT: 1 %
Eosinophils Absolute: 0 10*3/uL (ref 0.0–0.5)
HCT: 41.9 % (ref 38.4–49.9)
Hemoglobin: 14 g/dL (ref 13.0–17.1)
LYMPHS PCT: 18 %
Lymphs Abs: 0.9 10*3/uL (ref 0.9–3.3)
MCH: 28.9 pg (ref 27.2–33.4)
MCHC: 33.4 g/dL (ref 32.0–36.0)
MCV: 86.4 fL (ref 79.3–98.0)
Monocytes Absolute: 0.9 10*3/uL (ref 0.1–0.9)
Monocytes Relative: 17 %
NEUTROS PCT: 64 %
Neutro Abs: 3.2 10*3/uL (ref 1.5–6.5)
PLATELETS: 242 10*3/uL (ref 140–400)
RBC: 4.85 MIL/uL (ref 4.20–5.82)
RDW: 15.5 % — ABNORMAL HIGH (ref 11.0–14.6)
WBC: 5 10*3/uL (ref 4.0–10.3)

## 2017-07-11 LAB — COMPREHENSIVE METABOLIC PANEL
ALT: 27 U/L (ref 0–55)
AST: 27 U/L (ref 5–34)
Albumin: 4.4 g/dL (ref 3.5–5.0)
Alkaline Phosphatase: 79 U/L (ref 40–150)
Anion gap: 11 (ref 3–11)
BILIRUBIN TOTAL: 0.6 mg/dL (ref 0.2–1.2)
BUN: 14 mg/dL (ref 7–26)
CO2: 23 mmol/L (ref 22–29)
CREATININE: 0.93 mg/dL (ref 0.70–1.30)
Calcium: 9.8 mg/dL (ref 8.4–10.4)
Chloride: 106 mmol/L (ref 98–109)
Glucose, Bld: 81 mg/dL (ref 70–140)
POTASSIUM: 4 mmol/L (ref 3.5–5.1)
Sodium: 140 mmol/L (ref 136–145)
Total Protein: 7.5 g/dL (ref 6.4–8.3)

## 2017-07-11 LAB — MAGNESIUM: MAGNESIUM: 2.3 mg/dL (ref 1.5–2.5)

## 2017-07-11 MED ORDER — POTASSIUM CHLORIDE 2 MEQ/ML IV SOLN
Freq: Once | INTRAVENOUS | Status: AC
  Administered 2017-07-11: 11:00:00 via INTRAVENOUS
  Filled 2017-07-11: qty 10

## 2017-07-11 MED ORDER — PALONOSETRON HCL INJECTION 0.25 MG/5ML
INTRAVENOUS | Status: AC
  Filled 2017-07-11: qty 5

## 2017-07-11 MED ORDER — FAMOTIDINE IN NACL 20-0.9 MG/50ML-% IV SOLN: 20.0000 mg | Freq: Two times a day (BID) | INTRAVENOUS | Status: DC

## 2017-07-11 MED ORDER — SODIUM CHLORIDE 0.9% FLUSH
10.0000 mL | Freq: Once | INTRAVENOUS | Status: AC
  Administered 2017-07-11: 10 mL
  Filled 2017-07-11: qty 10

## 2017-07-11 MED ORDER — PEGFILGRASTIM 6 MG/0.6ML ~~LOC~~ PSKT
PREFILLED_SYRINGE | SUBCUTANEOUS | Status: AC
  Filled 2017-07-11: qty 0.6

## 2017-07-11 MED ORDER — HEPARIN SOD (PORK) LOCK FLUSH 100 UNIT/ML IV SOLN
500.0000 [IU] | Freq: Once | INTRAVENOUS | Status: AC | PRN
  Administered 2017-07-11: 500 [IU]
  Filled 2017-07-11: qty 5

## 2017-07-11 MED ORDER — PEGFILGRASTIM 6 MG/0.6ML ~~LOC~~ PSKT
6.0000 mg | PREFILLED_SYRINGE | Freq: Once | SUBCUTANEOUS | Status: AC
  Administered 2017-07-11: 6 mg via SUBCUTANEOUS

## 2017-07-11 MED ORDER — SODIUM CHLORIDE 0.9 % IV SOLN
20.0000 mg | Freq: Once | INTRAVENOUS | Status: AC
  Administered 2017-07-11: 20 mg via INTRAVENOUS
  Filled 2017-07-11: qty 2

## 2017-07-11 MED ORDER — SODIUM CHLORIDE 0.9 % IV SOLN
500.0000 mg/m2 | Freq: Once | INTRAVENOUS | Status: AC
  Administered 2017-07-11: 1080 mg via INTRAVENOUS
  Filled 2017-07-11: qty 54

## 2017-07-11 MED ORDER — DOXORUBICIN HCL CHEMO IV INJECTION 2 MG/ML
50.0000 mg/m2 | Freq: Once | INTRAVENOUS | Status: AC
Start: 2017-07-11 — End: 2017-07-11
  Administered 2017-07-11: 108 mg via INTRAVENOUS
  Filled 2017-07-11: qty 54

## 2017-07-11 MED ORDER — SODIUM CHLORIDE 0.9 % IV SOLN
46.0000 mg/m2 | Freq: Once | INTRAVENOUS | Status: AC
  Administered 2017-07-11: 100 mg via INTRAVENOUS
  Filled 2017-07-11: qty 100

## 2017-07-11 MED ORDER — PALONOSETRON HCL INJECTION 0.25 MG/5ML
0.2500 mg | Freq: Once | INTRAVENOUS | Status: AC
  Administered 2017-07-11: 0.25 mg via INTRAVENOUS

## 2017-07-11 MED ORDER — SODIUM CHLORIDE 0.9% FLUSH
10.0000 mL | INTRAVENOUS | Status: DC | PRN
  Administered 2017-07-11: 10 mL
  Filled 2017-07-11: qty 10

## 2017-07-11 MED ORDER — SODIUM CHLORIDE 0.9 % IV SOLN
Freq: Once | INTRAVENOUS | Status: AC
  Administered 2017-07-11: 11:00:00 via INTRAVENOUS

## 2017-07-11 MED ORDER — FOSAPREPITANT DIMEGLUMINE INJECTION 150 MG
Freq: Once | INTRAVENOUS | Status: AC
  Administered 2017-07-11: 13:00:00 via INTRAVENOUS
  Filled 2017-07-11: qty 5

## 2017-07-11 NOTE — Assessment & Plan Note (Signed)
He has chemotherapy induced delayed nausea and vomiting He will continue taking antiemetics as prescribed

## 2017-07-11 NOTE — Progress Notes (Signed)
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) The patient had a bizarre reaction that causes dystonic reaction manifesting like seizure activity that resolved spontaneously with cycle 1 of treatment I do not believe this is related to chemotherapy We will proceed with treatment without dose adjustment I plan to repeat imaging study after 3 cycles of chemotherapy  Metastasis to lung (HCC) He is mildly symptomatic with atypical chest discomfort He denies cough or shortness of breath I plan to repeat imaging study after 3 cycles of treatment  Chemotherapy-induced nausea He has chemotherapy induced delayed nausea and vomiting He will continue taking antiemetics as prescribed  Peripheral neuropathy due to chemotherapy (HCC) he has mild peripheral neuropathy, likely related to side effects of treatment. It is only mild, not bothering the patient. I will observe for now If it gets worse in the future, I will consider modifying the dose of the treatment    Orders Placed This Encounter  Procedures  . CT CHEST W CONTRAST    Standing Status:   Future    Standing Expiration Date:   07/12/2018    Order Specific Question:   If indicated for the ordered procedure, I authorize the administration of contrast media per Radiology protocol    Answer:   Yes    Order Specific Question:   Preferred imaging location?    Answer:   [facility]    Order Specific Question:   Radiology Contrast Protocol - do NOT remove file path    Answer:   \\[email or web address].[email or web address]  . CT Soft Tissue Neck W Contrast    Standing Status:   Future    Standing Expiration Date:   07/11/2018    Order Specific Question:   If indicated for the ordered procedure, I authorize the administration of contrast media per Radiology protocol    Answer:    Yes    Order Specific Question:   Preferred imaging location?    Answer:   [facility]    Order Specific Question:   Radiology Contrast Protocol - do NOT remove file path    Answer:   \\[email or web address].pdf  . ECHOCARDIOGRAM COMPLETE    Standing Status:   Future    Standing Expiration Date:   10/12/2018    Scheduling Instructions:     3/29    Order Specific Question:   Where should this test be performed    Answer:   [place]    Order Specific Question:   Perflutren DEFINITY (image enhancing agent) should be administered unless hypersensitivity or allergy exist    Answer:   Administer Perflutren    Order Specific Question:   Expected Date:    Answer:   Other - See Comments    INTERVAL HISTORY: Please see below for problem oriented charting. 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 No acute intracranial abnormality. 2. Swollen right submandibular gland containing prominent calcifications consistent with submandibular gland stones. Submandibular gland enlargement is most likely inflammatory. Malignancy cannot be entirely excluded .      07/12/2014 Pathology Results    Submandibular gland, Right - ADENOID CYSTIC CARCINOMA, GRADE I OF III, SEE COMMENT. - POSITIVE FOR PERINEURAL INVASION. - TUMOR INVOLVES SURGICAL EXCISIONAL MARGIN - SEE TUMOR SYNOPTIC TEMPLATE  BELOW. Microscopic Comment ONCOLOGY TABLE - SALIVARY GLAND 1. Specimen: Right submandibular gland 2. Procedure: Excision. 3. Tumor site and laterality: Right submandibular gland 4. Tumor focality: Unifocal 5. Maximum tumor size (cm): 3.8 cm 6. Histologic type: Adenoid cystic carcinoma. 7. Grade: I of III 8. Margins: Positive Distance of tumor from closest margin: Present at margin 9. Perineural invasion: Present 10. Lymph-Vascular invasion: Absent. 11. Lymph nodes: # examined - 0; # positive - N/A; extracapsular extension - N/A 12. TNM code: pT2, pNX, pMX      07/12/2014 Surgery    Right submandibular gland excision       07/24/2014 Miscellaneous    The patient was seen by radiation oncologist locally who offered him adjuvant radiation treatment.  The patient obtained second opinion at [facility] who recommended against adjuvant radiation treatment.  The patient finally decided not proceed with radiation therapy      07/29/2014 Imaging    No findings specific for metastatic disease in the chest or abdomen. 4 mm subpleural nodule in the medial left lower lobe, likely reflecting a benign subpleural lymph node, although technically indeterminate. Attention on follow-up is suggested.       07/30/2014 Imaging    MR face: Postop resection of right submandibular gland for adenoid cystic carcinoma. No evidence of perineural spread. No skullbase or cavernous sinus involvement.       08/12/2014 Surgery    Right level I, 2 neck selective dissection       08/12/2014 Pathology Results    Lymph nodes, radical neck dissection, limited right neck - BENIGN SALIVARY GLAND TISSUE; NEGATIVE FOR ATYPIA OR MALIGNANCY. - BENIGN SKIN WITH DERMAL AND SUBCUTANEOUS SCAR AND INFLAMMATORY TISSUE REACTION. - TWO INTRA-SALIVARY GLAND LYMPH NODES, NEGATIVE FOR TUMOR (0/2) - ONE LEVEL ONE-A LYMPH NODE, NEGATIVE FOR TUMOR (0/1). - SIX LEVEL TWO LYMPH NODES, NEGATIVE FOR TUMOR (0/6)       04/07/2015 Imaging    MR neck: 1. Status post resection of right submandibular gland. 2. No evidence for perineural spread or tumor recurrence. 3. Stable central and bilateral foraminal narrowing at C6-7.      09/25/2015 Imaging    MR neck: Stable exam. No evidence of residual or recurrent right submandibular carcinoma.      07/08/2016 Procedure    He underwent repeat excision of right submandibular mass: A 2 x 3 cm ellipse was made around mass incorporating the scar. This was carried down through skin and subcutaneous fat and the dissection was completed at the level of the sternomastoid muscle. Examination of the specimen revealed complete removal. This was sent for pathologic interpretation.           07/09/2016 Pathology Results    Adenoid cystic carcinoma (margins free of neoplasm)      08/06/2016 Imaging    CT scan of chest, abdomen and pelvis: 1. There are now multiple pulmonary nodules consistent with metastatic disease to the lungs. One nodule was present previously, measuring 4 mm, noted along the pleural margin of the left lower   lobe. This is most likely a benign granuloma. The remaining visualized nodules are new consistent with metastatic disease, given the patient's history. 2. No other findings of metastatic disease within the chest. 3. No evidence of metastatic disease in the abdomen or pelvis. 4. No acute findings.      08/06/2016 Imaging    Ct neck: 1. Multiple new bilateral apical pulmonary nodules measuring up to 1.3 cm and concerning for metastatic disease. 2. 22 x 7 mm right level III lymph node, enlarged from 2016 and indeterminate. 3. Prior right submandibular gland resection without evidence of locally recurrent mass.      08/06/2016 Imaging    MR face: Prior right submandibular gland resection without evidence of recurrent tumor      10/12/2016 Imaging    There is no new mediastinal or hilar lymphadenopathy.  The heart is normal in size and there is no  pericardial or pleural effusion.  Bilateral pulmonary nodules have slightly increased in size, suspicious for metastatic disease. For instance a 14 mm nodule on image 39 previously measured 12 mm. The largest nodule in the right lower lobe measures 24 mm in diameter.  Limited imaging through the upper abdomen shows normal adrenal glands.      10/12/2016 Imaging    CT neck 1.No evidence for primary or nodal recurrence in the neck. 2.Bilateral pulmonary nodules.      02/16/2017 Imaging    Ct chest showed enlarging pulmonary metastases.      02/16/2017 Imaging    1. Unchanged examination of the neck with stable size of right level 3 and left level IIa lymph nodes. 2. Please see dedicated report for concomitant chest CT for findings below thoracic inlet.      03/25/2017 Procedure    Technically successful CT guided core needle core biopsy of indeterminate enlarging left lower lobe pulmonary nodule.      03/25/2017 Pathology Results    Lung, biopsy, Left lower lobe - FINDINGS CONSISTENT WITH METASTATIC ADENOID CYSTIC CARCINOMA, SEE COMMENT. Microscopic Comment There is a small focus of tumor with similar morphology to the patient's prior adenoid cystic carcinoma of the submandibular gland ([id number], reviewed).       04/18/2017 Imaging    Progressive bilateral pulmonary metastases, as above. Three subcentimeter hypervascular lesions in the right liver, unchanged from March 2018, indeterminate.      05/05/2017 Procedure    Successful placement of a right internal jugular approach power injectable Port-A-Cath. The catheter is ready for immediate use      05/16/2017 -  Chemotherapy    The patient had chemotherapy with cisplatin, cytoxan and doxorubicin       REVIEW OF SYSTEMS:   Constitutional: Denies fevers, chills or abnormal weight loss Eyes: Denies blurriness of vision Ears, nose, mouth, throat, and face: Denies mucositis or sore throat Respiratory: Denies cough,  dyspnea or wheezes Cardiovascular: Denies palpitation, chest discomfort or lower extremity swelling Skin: Denies abnormal skin rashes Lymphatics: Denies new lymphadenopathy or easy bruising Neurological:Denies numbness, tingling or new weaknesses Behavioral/Psych: Mood is stable, no new changes  All other systems were reviewed with the patient and are negative.  I have reviewed the past medical history, past surgical history, social history and family history with the patient and they are unchanged from previous note.  ALLERGIES:  is allergic to hydrocodone.  MEDICATIONS:  Current Outpatient Medications  Medication Sig Dispense Refill  . DULoxetine (CYMBALTA) 60 MG capsule Take 1 capsule (60 mg total) by mouth daily. 30 capsule 0  .  famotidine (PEPCID AC) 10 MG tablet Take 10 mg daily as needed by mouth for heartburn or indigestion.    Marland Kitchen ibuprofen (ADVIL,MOTRIN) 200 MG tablet Take 200-800 mg 2 (two) times daily as needed by mouth for moderate pain.    Marland Kitchen lidocaine-prilocaine (EMLA) cream Apply 1 application topically as needed. 30 g 6  . loratadine (CLARITIN) 10 MG tablet Take 10 mg by mouth daily as needed for allergies.     . Multiple Vitamin (MULTIVITAMIN WITH MINERALS) TABS tablet Take 1 tablet daily by mouth.    . naproxen sodium (ALEVE) 220 MG tablet Take 440 mg daily as needed by mouth (pain).    . ondansetron (ZOFRAN) 8 MG tablet TAKE 1 TABLET BY MOUTH TWICE DAILY AS NEEDED --  START  ON  THE  THIRD  DAY  AFTER  CHEMOTHERAPY 90 tablet 1  . prochlorperazine (COMPAZINE) 10 MG tablet TAKE 1 TABLET BY MOUTH EVERY 6 HOURS AS NEEDED FOR NAUSEA AND VOMITING 90 tablet 1   No current facility-administered medications for this visit.     PHYSICAL EXAMINATION: ECOG PERFORMANCE STATUS: 1 - Symptomatic but completely ambulatory  Vitals:   07/11/17 0911  BP: 115/80  Pulse: 73  Resp: 18  Temp: 98 F (36.7 C)  SpO2: 100%   Filed Weights   07/11/17 0911  Weight: 204 lb 9.6 oz (92.8  kg)    GENERAL:alert, no distress and comfortable SKIN: skin color, texture, turgor are normal, no rashes or significant lesions EYES: normal, Conjunctiva are pink and non-injected, sclera clear OROPHARYNX:no exudate, no erythema and lips, buccal mucosa, and tongue normal  NECK: supple, thyroid normal size, non-tender, without nodularity LYMPH:  no palpable lymphadenopathy in the cervical, axillary or inguinal LUNGS: clear to auscultation and percussion with normal breathing effort HEART: regular rate & rhythm and no murmurs and no lower extremity edema ABDOMEN:abdomen soft, non-tender and normal bowel sounds Musculoskeletal:no cyanosis of digits and no clubbing  NEURO: alert & oriented x 3 with fluent speech, no focal motor/sensory deficits  LABORATORY DATA:  I have reviewed the data as listed    Component Value Date/Time   NA 140 07/11/2017 0840   NA 142 05/11/2017 0917   K 4.0 07/11/2017 0840   K 4.1 05/11/2017 0917   CL 106 07/11/2017 0840   CO2 23 07/11/2017 0840   CO2 26 05/11/2017 0917   GLUCOSE 81 07/11/2017 0840   GLUCOSE 82 05/11/2017 0917   BUN 14 07/11/2017 0840   BUN 14.2 05/11/2017 0917   CREATININE 0.93 07/11/2017 0840   CREATININE 0.9 05/11/2017 0917   CALCIUM 9.8 07/11/2017 0840   CALCIUM 9.6 05/11/2017 0917   PROT 7.5 07/11/2017 0840   PROT 7.7 05/11/2017 0917   ALBUMIN 4.4 07/11/2017 0840   ALBUMIN 4.2 05/11/2017 0917   AST 27 07/11/2017 0840   AST 53 (H) 05/11/2017 0917   ALT 27 07/11/2017 0840   ALT 61 (H) 05/11/2017 0917   ALKPHOS 79 07/11/2017 0840   ALKPHOS 114 05/11/2017 0917   BILITOT 0.6 07/11/2017 0840   BILITOT 0.48 05/11/2017 0917   GFRNONAA >60 07/11/2017 0840   GFRAA >60 07/11/2017 0840    No results found for: SPEP, UPEP  Lab Results  Component Value Date   WBC 5.0 07/11/2017   NEUTROABS 3.2 07/11/2017   HGB 14.0 07/11/2017   HCT 41.9 07/11/2017   MCV 86.4 07/11/2017   PLT 242 07/11/2017      Chemistry      Component  Value  Date/Time   NA 140 07/11/2017 0840   NA 142 05/11/2017 0917   K 4.0 07/11/2017 0840   K 4.1 05/11/2017 0917   CL 106 07/11/2017 0840   CO2 23 07/11/2017 0840   CO2 26 05/11/2017 0917   BUN 14 07/11/2017 0840   BUN 14.2 05/11/2017 0917   CREATININE 0.93 07/11/2017 0840   CREATININE 0.9 05/11/2017 0917      Component Value Date/Time   CALCIUM 9.8 07/11/2017 0840   CALCIUM 9.6 05/11/2017 0917   ALKPHOS 79 07/11/2017 0840   ALKPHOS 114 05/11/2017 0917   AST 27 07/11/2017 0840   AST 53 (H) 05/11/2017 0917   ALT 27 07/11/2017 0840   ALT 61 (H) 05/11/2017 0917   BILITOT 0.6 07/11/2017 0840   BILITOT 0.48 05/11/2017 0917      All questions were answered. The patient knows to call the clinic with any problems, questions or concerns. No barriers to learning was detected.  I spent 25 minutes counseling the patient face to face. The total time spent in the appointment was 30 minutes and more than 50% was on counseling and review of test results   , MD 07/11/2017 10:19 AM  

## 2017-07-11 NOTE — Assessment & Plan Note (Signed)
he has mild peripheral neuropathy, likely related to side effects of treatment. It is only mild, not bothering the patient. I will observe for now If it gets worse in the future, I will consider modifying the dose of the treatment  

## 2017-07-11 NOTE — Assessment & Plan Note (Addendum)
The patient had a bizarre reaction that causes dystonic reaction manifesting like seizure activity that resolved spontaneously with cycle 1 of treatment I do not believe this is related to chemotherapy We will proceed with treatment without dose adjustment I plan to repeat imaging study after 3 cycles of chemotherapy

## 2017-07-11 NOTE — Patient Instructions (Signed)
[facility] Discharge Instructions for Patients Receiving Chemotherapy  Today you received the following chemotherapy agents:  Adriamycin, Cytoxan, and Cisplatin.  To help prevent nausea and vomiting after your treatment, we encourage you to take your nausea medication as directed.   If you develop nausea and vomiting that is not controlled by your nausea medication, call the clinic.   BELOW ARE SYMPTOMS THAT SHOULD BE REPORTED IMMEDIATELY:  *FEVER GREATER THAN 100.5 F  *CHILLS WITH OR WITHOUT FEVER  NAUSEA AND VOMITING THAT IS NOT CONTROLLED WITH YOUR NAUSEA MEDICATION  *UNUSUAL SHORTNESS OF BREATH  *UNUSUAL BRUISING OR BLEEDING  TENDERNESS IN MOUTH AND THROAT WITH OR WITHOUT PRESENCE OF ULCERS  *URINARY PROBLEMS  *BOWEL PROBLEMS  UNUSUAL RASH Items with * indicate a potential emergency and should be followed up as soon as possible.  Feel free to call the clinic should you have any questions or concerns. The clinic phone number is [phone].  Please show the [place] at check-in to the Emergency Department and triage nurse.  Doxorubicin injection What is this medicine? DOXORUBICIN (dox oh ROO bi sin) is a chemotherapy drug. It is used to treat many kinds of cancer like leukemia, lymphoma, neuroblastoma, sarcoma, and Wilms' tumor. It is also used to treat bladder cancer, breast cancer, lung cancer, ovarian cancer, stomach cancer, and thyroid cancer. This medicine may be used for other purposes; ask your health care provider or pharmacist if you have questions. COMMON BRAND NAME(S): Adriamycin, Adriamycin PFS, Adriamycin RDF, Rubex What should I tell my health care provider before I take this medicine? They need to know if you have any of these conditions: -heart disease -history of low blood counts caused by a medicine -liver disease -recent or ongoing radiation therapy -an unusual or allergic reaction to doxorubicin, other chemotherapy agents,  other medicines, foods, dyes, or preservatives -pregnant or trying to get pregnant -breast-feeding How should I use this medicine? This drug is given as an infusion into a vein. It is administered in a hospital or clinic by a specially trained health care professional. If you have pain, swelling, burning or any unusual feeling around the site of your injection, tell your health care professional right away. Talk to your pediatrician regarding the use of this medicine in children. Special care may be needed. Overdosage: If you think you have taken too much of this medicine contact a poison control center or emergency room at once. NOTE: This medicine is only for you. Do not share this medicine with others. What if I miss a dose? It is important not to miss your dose. Call your doctor or health care professional if you are unable to keep an appointment. What may interact with this medicine? This medicine may interact with the following medications: -6-mercaptopurine -paclitaxel -phenytoin -St. John's Wort -trastuzumab -verapamil This list may not describe all possible interactions. Give your health care provider a list of all the medicines, herbs, non-prescription drugs, or dietary supplements you use. Also tell them if you smoke, drink alcohol, or use illegal drugs. Some items may interact with your medicine. What should I watch for while using this medicine? This drug may make you feel generally unwell. This is not uncommon, as chemotherapy can affect healthy cells as well as cancer cells. Report any side effects. Continue your course of treatment even though you feel ill unless your doctor tells you to stop. There is a maximum amount of this medicine you should receive throughout your life. The amount  depends on the medical condition being treated and your overall health. Your doctor will watch how much of this medicine you receive in your lifetime. Tell your doctor if you have taken this  medicine before. You may need blood work done while you are taking this medicine. Your urine may turn red for a few days after your dose. This is not blood. If your urine is dark or brown, call your doctor. In some cases, you may be given additional medicines to help with side effects. Follow all directions for their use. Call your doctor or health care professional for advice if you get a fever, chills or sore throat, or other symptoms of a cold or flu. Do not treat yourself. This drug decreases your body's ability to fight infections. Try to avoid being around people who are sick. This medicine may increase your risk to bruise or bleed. Call your doctor or health care professional if you notice any unusual bleeding. Talk to your doctor about your risk of cancer. You may be more at risk for certain types of cancers if you take this medicine. Do not become pregnant while taking this medicine or for 6 months after stopping it. Women should inform their doctor if they wish to become pregnant or think they might be pregnant. Men should not father a child while taking this medicine and for 6 months after stopping it. There is a potential for serious side effects to an unborn child. Talk to your health care professional or pharmacist for more information. Do not breast-feed an infant while taking this medicine. This medicine has caused ovarian failure in some women and reduced sperm counts in some men This medicine may interfere with the ability to have a child. Talk with your doctor or health care professional if you are concerned about your fertility. What side effects may I notice from receiving this medicine? Side effects that you should report to your doctor or health care professional as soon as possible: -allergic reactions like skin rash, itching or hives, swelling of the face, lips, or tongue -breathing problems -chest pain -fast or irregular heartbeat -low blood counts - this medicine may  decrease the number of white blood cells, red blood cells and platelets. You may be at increased risk for infections and bleeding. -pain, redness, or irritation at site where injected -signs of infection - fever or chills, cough, sore throat, pain or difficulty passing urine -signs of decreased platelets or bleeding - bruising, pinpoint red spots on the skin, black, tarry stools, blood in the urine -swelling of the ankles, feet, hands -tiredness -weakness Side effects that usually do not require medical attention (report to your doctor or health care professional if they continue or are bothersome): -diarrhea -hair loss -mouth sores -nail discoloration or damage -nausea -red colored urine -vomiting This list may not describe all possible side effects. Call your doctor for medical advice about side effects. You may report side effects to FDA at 1-800-FDA-1088. Where should I keep my medicine? This drug is given in a hospital or clinic and will not be stored at home. NOTE: This sheet is a summary. It may not cover all possible information. If you have questions about this medicine, talk to your doctor, pharmacist, or health care provider.  2018 Elsevier/Gold Standard (2015-06-23 11:28:51)  Cyclophosphamide injection What is this medicine? CYCLOPHOSPHAMIDE (sye kloe FOSS fa mide) is a chemotherapy drug. It slows the growth of cancer cells. This medicine is used to treat many types of cancer  like lymphoma, myeloma, leukemia, breast cancer, and ovarian cancer, to name a few. This medicine may be used for other purposes; ask your health care provider or pharmacist if you have questions. COMMON BRAND NAME(S): Cytoxan, Neosar What should I tell my health care provider before I take this medicine? They need to know if you have any of these conditions: -blood disorders -history of other chemotherapy -infection -kidney disease -liver disease -recent or ongoing radiation therapy -tumors in the  bone marrow -an unusual or allergic reaction to cyclophosphamide, other chemotherapy, other medicines, foods, dyes, or preservatives -pregnant or trying to get pregnant -breast-feeding How should I use this medicine? This drug is usually given as an injection into a vein or muscle or by infusion into a vein. It is administered in a hospital or clinic by a specially trained health care professional. Talk to your pediatrician regarding the use of this medicine in children. Special care may be needed. Overdosage: If you think you have taken too much of this medicine contact a poison control center or emergency room at once. NOTE: This medicine is only for you. Do not share this medicine with others. What if I miss a dose? It is important not to miss your dose. Call your doctor or health care professional if you are unable to keep an appointment. What may interact with this medicine? This medicine may interact with the following medications: -amiodarone -amphotericin B -azathioprine -certain antiviral medicines for HIV or AIDS such as protease inhibitors (e.g., indinavir, ritonavir) and zidovudine -certain blood pressure medications such as benazepril, captopril, enalapril, fosinopril, lisinopril, moexipril, monopril, perindopril, quinapril, ramipril, trandolapril -certain cancer medications such as anthracyclines (e.g., daunorubicin, doxorubicin), busulfan, cytarabine, paclitaxel, pentostatin, tamoxifen, trastuzumab -certain diuretics such as chlorothiazide, chlorthalidone, hydrochlorothiazide, indapamide, metolazone -certain medicines that treat or prevent blood clots like warfarin -certain muscle relaxants such as succinylcholine -cyclosporine -etanercept -indomethacin -medicines to increase blood counts like filgrastim, pegfilgrastim, sargramostim -medicines used as general anesthesia -metronidazole -natalizumab This list may not describe all possible interactions. Give your health care  provider a list of all the medicines, herbs, non-prescription drugs, or dietary supplements you use. Also tell them if you smoke, drink alcohol, or use illegal drugs. Some items may interact with your medicine. What should I watch for while using this medicine? Visit your doctor for checks on your progress. This drug may make you feel generally unwell. This is not uncommon, as chemotherapy can affect healthy cells as well as cancer cells. Report any side effects. Continue your course of treatment even though you feel ill unless your doctor tells you to stop. Drink water or other fluids as directed. Urinate often, even at night. In some cases, you may be given additional medicines to help with side effects. Follow all directions for their use. Call your doctor or health care professional for advice if you get a fever, chills or sore throat, or other symptoms of a cold or flu. Do not treat yourself. This drug decreases your body's ability to fight infections. Try to avoid being around people who are sick. This medicine may increase your risk to bruise or bleed. Call your doctor or health care professional if you notice any unusual bleeding. Be careful brushing and flossing your teeth or using a toothpick because you may get an infection or bleed more easily. If you have any dental work done, tell your dentist you are receiving this medicine. You may get drowsy or dizzy. Do not drive, use machinery, or do anything that needs  mental alertness until you know how this medicine affects you. Do not become pregnant while taking this medicine or for 1 year after stopping it. Women should inform their doctor if they wish to become pregnant or think they might be pregnant. Men should not father a child while taking this medicine and for 4 months after stopping it. There is a potential for serious side effects to an unborn child. Talk to your health care professional or pharmacist for more information. Do not  breast-feed an infant while taking this medicine. This medicine may interfere with the ability to have a child. This medicine has caused ovarian failure in some women. This medicine has caused reduced sperm counts in some men. You should talk with your doctor or health care professional if you are concerned about your fertility. If you are going to have surgery, tell your doctor or health care professional that you have taken this medicine. What side effects may I notice from receiving this medicine? Side effects that you should report to your doctor or health care professional as soon as possible: -allergic reactions like skin rash, itching or hives, swelling of the face, lips, or tongue -low blood counts - this medicine may decrease the number of white blood cells, red blood cells and platelets. You may be at increased risk for infections and bleeding. -signs of infection - fever or chills, cough, sore throat, pain or difficulty passing urine -signs of decreased platelets or bleeding - bruising, pinpoint red spots on the skin, black, tarry stools, blood in the urine -signs of decreased red blood cells - unusually weak or tired, fainting spells, lightheadedness -breathing problems -dark urine -dizziness -palpitations -swelling of the ankles, feet, hands -trouble passing urine or change in the amount of urine -weight gain -yellowing of the eyes or skin Side effects that usually do not require medical attention (report to your doctor or health care professional if they continue or are bothersome): -changes in nail or skin color -hair loss -missed menstrual periods -mouth sores -nausea, vomiting This list may not describe all possible side effects. Call your doctor for medical advice about side effects. You may report side effects to FDA at 1-800-FDA-1088. Where should I keep my medicine? This drug is given in a hospital or clinic and will not be stored at home. NOTE: This sheet is a  summary. It may not cover all possible information. If you have questions about this medicine, talk to your doctor, pharmacist, or health care provider.  2018 Elsevier/Gold Standard (2012-03-10 16:22:58)  Cisplatin injection What is this medicine? CISPLATIN (SIS pla tin) is a chemotherapy drug. It targets fast dividing cells, like cancer cells, and causes these cells to die. This medicine is used to treat many types of cancer like bladder, ovarian, and testicular cancers. This medicine may be used for other purposes; ask your health care provider or pharmacist if you have questions. COMMON BRAND NAME(S): Platinol, Platinol -AQ What should I tell my health care provider before I take this medicine? They need to know if you have any of these conditions: -blood disorders -hearing problems -kidney disease -recent or ongoing radiation therapy -an unusual or allergic reaction to cisplatin, carboplatin, other chemotherapy, other medicines, foods, dyes, or preservatives -pregnant or trying to get pregnant -breast-feeding How should I use this medicine? This drug is given as an infusion into a vein. It is administered in a hospital or clinic by a specially trained health care professional. Talk to your pediatrician regarding the use of  this medicine in children. Special care may be needed. Overdosage: If you think you have taken too much of this medicine contact a poison control center or emergency room at once. NOTE: This medicine is only for you. Do not share this medicine with others. What if I miss a dose? It is important not to miss a dose. Call your doctor or health care professional if you are unable to keep an appointment. What may interact with this medicine? -dofetilide -foscarnet -medicines for seizures -medicines to increase blood counts like filgrastim, pegfilgrastim, sargramostim -probenecid -pyridoxine used with altretamine -rituximab -some antibiotics like amikacin,  gentamicin, neomycin, polymyxin B, streptomycin, tobramycin -sulfinpyrazone -vaccines -zalcitabine Talk to your doctor or health care professional before taking any of these medicines: -acetaminophen -aspirin -ibuprofen -ketoprofen -naproxen This list may not describe all possible interactions. Give your health care provider a list of all the medicines, herbs, non-prescription drugs, or dietary supplements you use. Also tell them if you smoke, drink alcohol, or use illegal drugs. Some items may interact with your medicine. What should I watch for while using this medicine? Your condition will be monitored carefully while you are receiving this medicine. You will need important blood work done while you are taking this medicine. This drug may make you feel generally unwell. This is not uncommon, as chemotherapy can affect healthy cells as well as cancer cells. Report any side effects. Continue your course of treatment even though you feel ill unless your doctor tells you to stop. In some cases, you may be given additional medicines to help with side effects. Follow all directions for their use. Call your doctor or health care professional for advice if you get a fever, chills or sore throat, or other symptoms of a cold or flu. Do not treat yourself. This drug decreases your body's ability to fight infections. Try to avoid being around people who are sick. This medicine may increase your risk to bruise or bleed. Call your doctor or health care professional if you notice any unusual bleeding. Be careful brushing and flossing your teeth or using a toothpick because you may get an infection or bleed more easily. If you have any dental work done, tell your dentist you are receiving this medicine. Avoid taking products that contain aspirin, acetaminophen, ibuprofen, naproxen, or ketoprofen unless instructed by your doctor. These medicines may hide a fever. Do not become pregnant while taking this  medicine. Women should inform their doctor if they wish to become pregnant or think they might be pregnant. There is a potential for serious side effects to an unborn child. Talk to your health care professional or pharmacist for more information. Do not breast-feed an infant while taking this medicine. Drink fluids as directed while you are taking this medicine. This will help protect your kidneys. Call your doctor or health care professional if you get diarrhea. Do not treat yourself. What side effects may I notice from receiving this medicine? Side effects that you should report to your doctor or health care professional as soon as possible: -allergic reactions like skin rash, itching or hives, swelling of the face, lips, or tongue -signs of infection - fever or chills, cough, sore throat, pain or difficulty passing urine -signs of decreased platelets or bleeding - bruising, pinpoint red spots on the skin, black, tarry stools, nosebleeds -signs of decreased red blood cells - unusually weak or tired, fainting spells, lightheadedness -breathing problems -changes in hearing -gout pain -low blood counts - This drug may decrease the  number of white blood cells, red blood cells and platelets. You may be at increased risk for infections and bleeding. -nausea and vomiting -pain, swelling, redness or irritation at the injection site -pain, tingling, numbness in the hands or feet -problems with balance, movement -trouble passing urine or change in the amount of urine Side effects that usually do not require medical attention (report to your doctor or health care professional if they continue or are bothersome): -changes in vision -loss of appetite -metallic taste in the mouth or changes in taste This list may not describe all possible side effects. Call your doctor for medical advice about side effects. You may report side effects to FDA at 1-800-FDA-1088. Where should I keep my medicine? This drug  is given in a hospital or clinic and will not be stored at home. NOTE: This sheet is a summary. It may not cover all possible information. If you have questions about this medicine, talk to your doctor, pharmacist, or health care provider.  2018 Elsevier/Gold Standard (2007-08-01 14:40:54)

## 2017-07-11 NOTE — Assessment & Plan Note (Signed)
He is mildly symptomatic with atypical chest discomfort He denies cough or shortness of breath I plan to repeat imaging study after 3 cycles of treatment

## 2017-07-27 ENCOUNTER — Ambulatory Visit (HOSPITAL_COMMUNITY)
Admission: RE | Admit: 2017-07-27 | Discharge: 2017-07-27 | Disposition: A | Discharge status: Home / Self Care | Payer: 59 | Source: Ambulatory Visit | Attending: Hematology and Oncology | Admitting: Hematology and Oncology

## 2017-07-27 DIAGNOSIS — R55 Syncope and collapse: Secondary | ICD-10-CM | POA: Diagnosis not present

## 2017-07-27 DIAGNOSIS — Z5111 Encounter for antineoplastic chemotherapy: Secondary | ICD-10-CM

## 2017-07-27 DIAGNOSIS — C080 Malignant neoplasm of submandibular gland: Secondary | ICD-10-CM

## 2017-07-27 DIAGNOSIS — K219 Gastro-esophageal reflux disease without esophagitis: Secondary | ICD-10-CM | POA: Diagnosis not present

## 2017-07-27 DIAGNOSIS — C7800 Secondary malignant neoplasm of unspecified lung: Secondary | ICD-10-CM | POA: Diagnosis not present

## 2017-07-27 NOTE — Progress Notes (Signed)
  Echocardiogram 2D Echocardiogram has been performed.  Kahli Mayon G Gavinn Collard 07/27/2017, 11:07 AM

## 2017-08-04 ENCOUNTER — Ambulatory Visit (HOSPITAL_COMMUNITY)
Admission: RE | Admit: 2017-08-04 | Discharge: 2017-08-04 | Disposition: A | Discharge status: Home / Self Care | Payer: 59 | Source: Ambulatory Visit | Attending: Hematology and Oncology | Admitting: Hematology and Oncology

## 2017-08-04 DIAGNOSIS — Z5111 Encounter for antineoplastic chemotherapy: Secondary | ICD-10-CM

## 2017-08-04 DIAGNOSIS — C7800 Secondary malignant neoplasm of unspecified lung: Secondary | ICD-10-CM

## 2017-08-04 DIAGNOSIS — C080 Malignant neoplasm of submandibular gland: Secondary | ICD-10-CM | POA: Diagnosis not present

## 2017-08-04 MED ORDER — IOPAMIDOL (ISOVUE-300) INJECTION 61%
INTRAVENOUS | Status: AC
  Filled 2017-08-04: qty 100

## 2017-08-04 MED ORDER — IOPAMIDOL (ISOVUE-300) INJECTION 61%
100.0000 mL | Freq: Once | INTRAVENOUS | Status: AC | PRN
  Administered 2017-08-04: 100 mL via INTRAVENOUS

## 2017-08-08 ENCOUNTER — Inpatient Hospital Stay: Payer: 59

## 2017-08-08 ENCOUNTER — Encounter: Payer: Self-pay | Admitting: Hematology and Oncology

## 2017-08-08 ENCOUNTER — Inpatient Hospital Stay: Payer: 59 | Attending: Hematology and Oncology | Admitting: Hematology and Oncology

## 2017-08-08 ENCOUNTER — Telehealth: Payer: Self-pay | Admitting: Hematology and Oncology

## 2017-08-08 DIAGNOSIS — R0789 Other chest pain: Secondary | ICD-10-CM

## 2017-08-08 DIAGNOSIS — H9319 Tinnitus, unspecified ear: Secondary | ICD-10-CM | POA: Diagnosis not present

## 2017-08-08 DIAGNOSIS — C7800 Secondary malignant neoplasm of unspecified lung: Secondary | ICD-10-CM

## 2017-08-08 DIAGNOSIS — C7802 Secondary malignant neoplasm of left lung: Secondary | ICD-10-CM | POA: Diagnosis not present

## 2017-08-08 DIAGNOSIS — Z79899 Other long term (current) drug therapy: Secondary | ICD-10-CM | POA: Diagnosis not present

## 2017-08-08 DIAGNOSIS — R110 Nausea: Secondary | ICD-10-CM

## 2017-08-08 DIAGNOSIS — C080 Malignant neoplasm of submandibular gland: Secondary | ICD-10-CM | POA: Diagnosis not present

## 2017-08-08 DIAGNOSIS — Z5189 Encounter for other specified aftercare: Secondary | ICD-10-CM | POA: Diagnosis not present

## 2017-08-08 DIAGNOSIS — C7801 Secondary malignant neoplasm of right lung: Secondary | ICD-10-CM | POA: Diagnosis not present

## 2017-08-08 DIAGNOSIS — R112 Nausea with vomiting, unspecified: Secondary | ICD-10-CM | POA: Diagnosis not present

## 2017-08-08 DIAGNOSIS — Z5111 Encounter for antineoplastic chemotherapy: Secondary | ICD-10-CM | POA: Insufficient documentation

## 2017-08-08 DIAGNOSIS — T451X5A Adverse effect of antineoplastic and immunosuppressive drugs, initial encounter: Secondary | ICD-10-CM

## 2017-08-08 LAB — CBC WITH DIFFERENTIAL/PLATELET
Basophils Absolute: 0 10*3/uL (ref 0.0–0.1)
Basophils Relative: 1 %
EOS PCT: 1 %
Eosinophils Absolute: 0.1 10*3/uL (ref 0.0–0.5)
HEMATOCRIT: 39.8 % (ref 38.4–49.9)
Hemoglobin: 13.3 g/dL (ref 13.0–17.1)
LYMPHS ABS: 0.9 10*3/uL (ref 0.9–3.3)
LYMPHS PCT: 16 %
MCH: 29.4 pg (ref 27.2–33.4)
MCHC: 33.4 g/dL (ref 32.0–36.0)
MCV: 87.9 fL (ref 79.3–98.0)
MONO ABS: 0.9 10*3/uL (ref 0.1–0.9)
Monocytes Relative: 16 %
NEUTROS ABS: 3.6 10*3/uL (ref 1.5–6.5)
Neutrophils Relative %: 66 %
PLATELETS: 252 10*3/uL (ref 140–400)
RBC: 4.53 MIL/uL (ref 4.20–5.82)
RDW: 15.9 % — AB (ref 11.0–14.6)
WBC: 5.5 10*3/uL (ref 4.0–10.3)

## 2017-08-08 LAB — COMPREHENSIVE METABOLIC PANEL
ALT: 20 U/L (ref 0–55)
AST: 23 U/L (ref 5–34)
Albumin: 4.4 g/dL (ref 3.5–5.0)
Alkaline Phosphatase: 78 U/L (ref 40–150)
Anion gap: 8 (ref 3–11)
BILIRUBIN TOTAL: 0.5 mg/dL (ref 0.2–1.2)
BUN: 14 mg/dL (ref 7–26)
CO2: 25 mmol/L (ref 22–29)
CREATININE: 0.93 mg/dL (ref 0.70–1.30)
Calcium: 10 mg/dL (ref 8.4–10.4)
Chloride: 107 mmol/L (ref 98–109)
Glucose, Bld: 77 mg/dL (ref 70–140)
POTASSIUM: 4.1 mmol/L (ref 3.5–5.1)
Sodium: 140 mmol/L (ref 136–145)
TOTAL PROTEIN: 7.2 g/dL (ref 6.4–8.3)

## 2017-08-08 LAB — MAGNESIUM: MAGNESIUM: 2.2 mg/dL (ref 1.5–2.5)

## 2017-08-08 MED ORDER — POTASSIUM CHLORIDE 2 MEQ/ML IV SOLN
Freq: Once | INTRAVENOUS | Status: AC
  Administered 2017-08-08: 10:00:00 via INTRAVENOUS
  Filled 2017-08-08: qty 10

## 2017-08-08 MED ORDER — SODIUM CHLORIDE 0.9 % IV SOLN
Freq: Once | INTRAVENOUS | Status: AC
  Administered 2017-08-08: 10:00:00 via INTRAVENOUS

## 2017-08-08 MED ORDER — SODIUM CHLORIDE 0.9 % IV SOLN
500.0000 mg/m2 | Freq: Once | INTRAVENOUS | Status: AC
  Administered 2017-08-08: 1080 mg via INTRAVENOUS
  Filled 2017-08-08: qty 54

## 2017-08-08 MED ORDER — SODIUM CHLORIDE 0.9 % IV SOLN
Freq: Once | INTRAVENOUS | Status: AC
  Administered 2017-08-08: 12:00:00 via INTRAVENOUS
  Filled 2017-08-08: qty 5

## 2017-08-08 MED ORDER — PROCHLORPERAZINE MALEATE 10 MG PO TABS: ORAL_TABLET | ORAL | 1 refills | Status: DC

## 2017-08-08 MED ORDER — FAMOTIDINE IN NACL 20-0.9 MG/50ML-% IV SOLN
20.0000 mg | Freq: Two times a day (BID) | INTRAVENOUS | Status: DC
  Administered 2017-08-08: 20 mg via INTRAVENOUS

## 2017-08-08 MED ORDER — PALONOSETRON HCL INJECTION 0.25 MG/5ML
0.2500 mg | Freq: Once | INTRAVENOUS | Status: AC
  Administered 2017-08-08: 0.25 mg via INTRAVENOUS

## 2017-08-08 MED ORDER — DOXORUBICIN HCL CHEMO IV INJECTION 2 MG/ML
50.0000 mg/m2 | Freq: Once | INTRAVENOUS | Status: AC
  Administered 2017-08-08: 108 mg via INTRAVENOUS
  Filled 2017-08-08: qty 54

## 2017-08-08 MED ORDER — PEGFILGRASTIM 6 MG/0.6ML ~~LOC~~ PSKT
PREFILLED_SYRINGE | SUBCUTANEOUS | Status: AC
  Filled 2017-08-08: qty 0.6

## 2017-08-08 MED ORDER — SODIUM CHLORIDE 0.9% FLUSH
10.0000 mL | INTRAVENOUS | Status: DC | PRN
  Administered 2017-08-08: 10 mL
  Filled 2017-08-08: qty 10

## 2017-08-08 MED ORDER — FAMOTIDINE IN NACL 20-0.9 MG/50ML-% IV SOLN
INTRAVENOUS | Status: AC
  Filled 2017-08-08: qty 50

## 2017-08-08 MED ORDER — SODIUM CHLORIDE 0.9% FLUSH
10.0000 mL | Freq: Once | INTRAVENOUS | Status: AC
  Administered 2017-08-08: 10 mL
  Filled 2017-08-08: qty 10

## 2017-08-08 MED ORDER — HEPARIN SOD (PORK) LOCK FLUSH 100 UNIT/ML IV SOLN
500.0000 [IU] | Freq: Once | INTRAVENOUS | Status: AC | PRN
  Administered 2017-08-08: 500 [IU]
  Filled 2017-08-08: qty 5

## 2017-08-08 MED ORDER — SODIUM CHLORIDE 0.9 % IV SOLN
46.0000 mg/m2 | Freq: Once | INTRAVENOUS | Status: AC
  Administered 2017-08-08: 100 mg via INTRAVENOUS
  Filled 2017-08-08: qty 100

## 2017-08-08 MED ORDER — PALONOSETRON HCL INJECTION 0.25 MG/5ML
INTRAVENOUS | Status: AC
  Filled 2017-08-08: qty 5

## 2017-08-08 MED ORDER — PEGFILGRASTIM 6 MG/0.6ML ~~LOC~~ PSKT
6.0000 mg | PREFILLED_SYRINGE | Freq: Once | SUBCUTANEOUS | Status: AC
  Administered 2017-08-08: 6 mg via SUBCUTANEOUS

## 2017-08-08 MED ORDER — ONDANSETRON HCL 8 MG PO TABS: ORAL_TABLET | ORAL | 1 refills | Status: DC

## 2017-08-08 NOTE — Assessment & Plan Note (Signed)
He has chemotherapy induced delayed nausea and vomiting He will continue taking antiemetics as prescribed

## 2017-08-08 NOTE — Assessment & Plan Note (Signed)
He is mildly symptomatic with atypical chest discomfort He denies cough or shortness of breath Imaging study shows stable lung nodules

## 2017-08-08 NOTE — Patient Instructions (Signed)
[facility] Discharge Instructions for Patients Receiving Chemotherapy  Today you received the following chemotherapy agents cisplatin/adriamycin/cytoxan   To help prevent nausea and vomiting after your treatment, we encourage you to take your nausea medication as directed.    If you develop nausea and vomiting that is not controlled by your nausea medication, call the clinic.   BELOW ARE SYMPTOMS THAT SHOULD BE REPORTED IMMEDIATELY:  *FEVER GREATER THAN 100.5 F  *CHILLS WITH OR WITHOUT FEVER  NAUSEA AND VOMITING THAT IS NOT CONTROLLED WITH YOUR NAUSEA MEDICATION  *UNUSUAL SHORTNESS OF BREATH  *UNUSUAL BRUISING OR BLEEDING  TENDERNESS IN MOUTH AND THROAT WITH OR WITHOUT PRESENCE OF ULCERS  *URINARY PROBLEMS  *BOWEL PROBLEMS  UNUSUAL RASH Items with * indicate a potential emergency and should be followed up as soon as possible.  Feel free to call the clinic you have any questions or concerns. The clinic phone number is [phone].

## 2017-08-08 NOTE — Assessment & Plan Note (Signed)
I have reviewed the imaging study CT scan of the neck show regression of the size of the lymph node while CT scan of the lung is stable Echocardiogram is within normal limits He tolerated treatment with mild expected side effects of treatment I recommend we proceed to complete 6 cycles of chemotherapy He agreed with the plan of care I would delay the start of cycle 5 to accommodate for his planned vacation

## 2017-08-08 NOTE — Telephone Encounter (Signed)
Gave patient AVs and calendar of upcoming May and June appointments.

## 2017-08-08 NOTE — Progress Notes (Signed)
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[place]) I have reviewed the imaging study CT scan of the neck show regression of the size of the lymph node while CT scan of the lung is stable Echocardiogram is within normal limits He tolerated treatment with mild expected side effects of treatment I recommend we proceed to complete 6 cycles of chemotherapy He agreed with the plan of care I would delay the start of cycle 5 to accommodate for his planned vacation  Metastasis to lung [facility]) He is mildly symptomatic with atypical chest discomfort He denies cough or shortness of breath Imaging study shows stable lung nodules  Chemotherapy-induced nausea He has chemotherapy induced delayed nausea and vomiting He will continue taking antiemetics as prescribed   No orders of the defined types were placed in this encounter.   INTERVAL HISTORY: Please see below for problem oriented charting. 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 No acute intracranial abnormality. 2. Swollen right submandibular gland containing prominent calcifications consistent with submandibular gland stones. Submandibular gland enlargement is most likely inflammatory. Malignancy cannot be entirely excluded .      07/12/2014 Pathology Results    Submandibular gland, Right - ADENOID CYSTIC CARCINOMA, GRADE I OF III, SEE  COMMENT. - POSITIVE FOR PERINEURAL INVASION. - TUMOR INVOLVES SURGICAL EXCISIONAL MARGIN - SEE TUMOR SYNOPTIC TEMPLATE BELOW. Microscopic Comment ONCOLOGY TABLE - SALIVARY GLAND 1. Specimen: Right submandibular gland 2. Procedure: Excision. 3. Tumor site and laterality: Right submandibular gland 4. Tumor focality: Unifocal 5. Maximum tumor size (cm): 3.8 cm 6. Histologic type: Adenoid cystic carcinoma. 7. Grade: I of III 8. Margins: Positive Distance of tumor from closest margin: Present at margin 9. Perineural invasion: Present 10. Lymph-Vascular invasion: Absent. 11. Lymph nodes: # examined - 0; # positive - N/A; extracapsular extension - N/A 12. TNM code: pT2, pNX, pMX      07/12/2014 Surgery    Right submandibular gland excision       07/24/2014 Miscellaneous    The patient was seen by radiation oncologist locally who offered him adjuvant radiation treatment.  The patient obtained second opinion at [facility] who recommended against adjuvant radiation treatment.  The patient finally decided not proceed with radiation therapy      07/29/2014 Imaging    No findings specific for metastatic disease in the chest or abdomen. 4 mm subpleural nodule in the medial left lower lobe, likely reflecting a benign subpleural lymph node, although technically indeterminate. Attention on follow-up is suggested.       07/30/2014 Imaging    MR face: Postop resection of right submandibular gland for adenoid cystic carcinoma. No evidence of perineural spread. No skullbase or cavernous sinus involvement.       08/12/2014 Surgery    Right level I, 2 neck selective dissection       08/12/2014 Pathology Results    Lymph nodes, radical neck dissection,  limited right neck - BENIGN SALIVARY GLAND TISSUE; NEGATIVE FOR ATYPIA OR MALIGNANCY. - BENIGN SKIN WITH DERMAL AND SUBCUTANEOUS SCAR AND INFLAMMATORY TISSUE REACTION. - TWO INTRA-SALIVARY GLAND LYMPH NODES, NEGATIVE FOR TUMOR  (0/2) - ONE LEVEL ONE-A LYMPH NODE, NEGATIVE FOR TUMOR (0/1). - SIX LEVEL TWO LYMPH NODES, NEGATIVE FOR TUMOR (0/6)      04/07/2015 Imaging    MR neck: 1. Status post resection of right submandibular gland. 2. No evidence for perineural spread or tumor recurrence. 3. Stable central and bilateral foraminal narrowing at C6-7.      09/25/2015 Imaging    MR neck: Stable exam. No evidence of residual or recurrent right submandibular carcinoma.      07/08/2016 Procedure    He underwent repeat excision of right submandibular mass: A 2 x 3 cm ellipse was made around mass incorporating the scar. This was carried down through skin and subcutaneous fat and the dissection was completed at the level of the sternomastoid muscle. Examination of the specimen revealed complete removal. This was sent for pathologic interpretation.           07/09/2016 Pathology Results    Adenoid cystic carcinoma (margins free of neoplasm)      08/06/2016 Imaging    CT scan of chest, abdomen and pelvis: 1. There are now multiple pulmonary nodules consistent with metastatic disease to the lungs. One nodule was present previously, measuring 4 mm, noted along the pleural margin of the left lower lobe. This is most likely a benign granuloma. The remaining visualized nodules are new consistent with metastatic disease, given the patient's history. 2. No other findings of metastatic disease within the chest. 3. No evidence of metastatic disease in the abdomen or pelvis. 4. No acute findings.      08/06/2016 Imaging    Ct neck: 1. Multiple new bilateral apical pulmonary nodules measuring up to 1.3 cm and concerning for metastatic disease. 2. 22 x 7 mm right level III lymph node, enlarged from 2016 and indeterminate. 3. Prior right submandibular gland resection without evidence of locally recurrent mass.      08/06/2016 Imaging    MR face: Prior right submandibular gland resection without evidence of recurrent tumor      10/12/2016  Imaging    There is no new mediastinal or hilar lymphadenopathy.  The heart is normal in size and there is no pericardial or pleural effusion.  Bilateral pulmonary nodules have slightly increased in size, suspicious for metastatic disease. For instance a 14 mm nodule on image 39 previously measured 12 mm. The largest nodule in the right lower lobe measures 24 mm in diameter.  Limited imaging through the upper abdomen shows normal adrenal glands.      10/12/2016 Imaging    CT neck 1.No evidence for primary or nodal recurrence in the neck. 2.Bilateral pulmonary nodules.      02/16/2017 Imaging    Ct chest showed enlarging pulmonary metastases.      02/16/2017 Imaging    1. Unchanged examination of the neck with stable size of right level 3 and left level IIa lymph nodes. 2. Please see dedicated report for concomitant chest CT for findings below thoracic inlet.      03/25/2017 Procedure    Technically successful CT guided core needle core biopsy of indeterminate enlarging left lower lobe pulmonary nodule.      03/25/2017 Pathology Results    Lung, biopsy, Left lower lobe - FINDINGS CONSISTENT WITH METASTATIC ADENOID CYSTIC CARCINOMA, SEE COMMENT. Microscopic Comment  There is a small focus of tumor with similar morphology to the patient's prior adenoid cystic carcinoma of the submandibular gland ([id number], reviewed).       04/18/2017 Imaging    Progressive bilateral pulmonary metastases, as above. Three subcentimeter hypervascular lesions in the right liver, unchanged from March 2018, indeterminate.      05/05/2017 Procedure    Successful placement of a right internal jugular approach power injectable Port-A-Cath. The catheter is ready for immediate use      05/16/2017 -  Chemotherapy    The patient had chemotherapy with cisplatin, cytoxan and doxorubicin      07/27/2017 Imaging    ECHO: LV EF: 55% -  60%      08/04/2017 Imaging    CT neck No locally recurrent tumor  status post RIGHT submandibular gland excision for adenoid cystic carcinoma.  Previously identified regional lymph nodes are smaller and non worrisome.  Please see CT chest report for additional important findings.      08/04/2017 Imaging    CT chest 1. Stable pulmonary metastatic disease. No new or progressive findings are identified. 2. No mediastinal or hilar mass or adenopathy. 3. No new/acute pulmonary findings.       REVIEW OF SYSTEMS:   Constitutional: Denies fevers, chills or abnormal weight loss Eyes: Denies blurriness of vision Ears, nose, mouth, throat, and face: Denies mucositis or sore throat Respiratory: Denies cough, dyspnea or wheezes Cardiovascular: Denies palpitation, chest discomfort or lower extremity swelling Gastrointestinal:  Denies nausea, heartburn or change in bowel habits Skin: Denies abnormal skin rashes Lymphatics: Denies new lymphadenopathy or easy bruising Neurological:Denies numbness, tingling or new weaknesses Behavioral/Psych: Mood is stable, no new changes  All other systems were reviewed with the patient and are negative.  I have reviewed the past medical history, past surgical history, social history and family history with the patient and they are unchanged from previous note.  ALLERGIES:  is allergic to hydrocodone.  MEDICATIONS:  Current Outpatient Medications  Medication Sig Dispense Refill  . DULoxetine (CYMBALTA) 60 MG capsule Take 1 capsule (60 mg total) by mouth daily. 30 capsule 0  . famotidine (PEPCID AC) 10 MG tablet Take 10 mg daily as needed by mouth for heartburn or indigestion.    Marland Kitchen ibuprofen (ADVIL,MOTRIN) 200 MG tablet Take 200-800 mg 2 (two) times daily as needed by mouth for moderate pain.    Marland Kitchen lidocaine-prilocaine (EMLA) cream Apply 1 application topically as needed. 30 g 6  . loratadine (CLARITIN) 10 MG tablet Take 10 mg by mouth daily as needed for allergies.     . Multiple Vitamin (MULTIVITAMIN WITH MINERALS) TABS  tablet Take 1 tablet daily by mouth.    . naproxen sodium (ALEVE) 220 MG tablet Take 440 mg daily as needed by mouth (pain).    . ondansetron (ZOFRAN) 8 MG tablet TAKE 1 TABLET BY MOUTH TWICE DAILY AS NEEDED --  START  ON  THE  THIRD  DAY  AFTER  CHEMOTHERAPY 90 tablet 1  . prochlorperazine (COMPAZINE) 10 MG tablet TAKE 1 TABLET BY MOUTH EVERY 6 HOURS AS NEEDED FOR NAUSEA AND VOMITING 90 tablet 1   No current facility-administered medications for this visit.    Facility-Administered Medications Ordered in Other Visits  Medication Dose Route Frequency Provider Last Rate Last Dose  . heparin lock flush 100 unit/mL  500 Units Intracatheter Once PRN Alvy Bimler, Mont Jagoda, MD      . sodium chloride flush (NS) 0.9 % injection 10 mL  10 mL  Intracatheter PRN Heath Lark, MD        PHYSICAL EXAMINATION: ECOG PERFORMANCE STATUS: 1 - Symptomatic but completely ambulatory  Vitals:   08/08/17 0915  BP: 122/84  Pulse: 74  Resp: 18  Temp: 98 F (36.7 C)  SpO2: 100%   Filed Weights   08/08/17 0915  Weight: 206 lb 6.4 oz (93.6 kg)    GENERAL:alert, no distress and comfortable SKIN: skin color, texture, turgor are normal, no rashes or significant lesions EYES: normal, Conjunctiva are pink and non-injected, sclera clear OROPHARYNX:no exudate, no erythema and lips, buccal mucosa, and tongue normal  NECK: supple, thyroid normal size, non-tender, without nodularity LYMPH:  no palpable lymphadenopathy in the cervical, axillary or inguinal LUNGS: clear to auscultation and percussion with normal breathing effort HEART: regular rate & rhythm and no murmurs and no lower extremity edema ABDOMEN:abdomen soft, non-tender and normal bowel sounds Musculoskeletal:no cyanosis of digits and no clubbing  NEURO: alert & oriented x 3 with fluent speech, no focal motor/sensory deficits  LABORATORY DATA:  I have reviewed the data as listed    Component Value Date/Time   NA 140 08/08/2017 0810   NA 142 05/11/2017 0917    K 4.1 08/08/2017 0810   K 4.1 05/11/2017 0917   CL 107 08/08/2017 0810   CO2 25 08/08/2017 0810   CO2 26 05/11/2017 0917   GLUCOSE 77 08/08/2017 0810   GLUCOSE 82 05/11/2017 0917   BUN 14 08/08/2017 0810   BUN 14.2 05/11/2017 0917   CREATININE 0.93 08/08/2017 0810   CREATININE 0.9 05/11/2017 0917   CALCIUM 10.0 08/08/2017 0810   CALCIUM 9.6 05/11/2017 0917   PROT 7.2 08/08/2017 0810   PROT 7.7 05/11/2017 0917   ALBUMIN 4.4 08/08/2017 0810   ALBUMIN 4.2 05/11/2017 0917   AST 23 08/08/2017 0810   AST 53 (H) 05/11/2017 0917   ALT 20 08/08/2017 0810   ALT 61 (H) 05/11/2017 0917   ALKPHOS 78 08/08/2017 0810   ALKPHOS 114 05/11/2017 0917   BILITOT 0.5 08/08/2017 0810   BILITOT 0.48 05/11/2017 0917   GFRNONAA >60 08/08/2017 0810   GFRAA >60 08/08/2017 0810    No results found for: SPEP, UPEP  Lab Results  Component Value Date   WBC 5.5 08/08/2017   NEUTROABS 3.6 08/08/2017   HGB 13.3 08/08/2017   HCT 39.8 08/08/2017   MCV 87.9 08/08/2017   PLT 252 08/08/2017      Chemistry      Component Value Date/Time   NA 140 08/08/2017 0810   NA 142 05/11/2017 0917   K 4.1 08/08/2017 0810   K 4.1 05/11/2017 0917   CL 107 08/08/2017 0810   CO2 25 08/08/2017 0810   CO2 26 05/11/2017 0917   BUN 14 08/08/2017 0810   BUN 14.2 05/11/2017 0917   CREATININE 0.93 08/08/2017 0810   CREATININE 0.9 05/11/2017 0917      Component Value Date/Time   CALCIUM 10.0 08/08/2017 0810   CALCIUM 9.6 05/11/2017 0917   ALKPHOS 78 08/08/2017 0810   ALKPHOS 114 05/11/2017 0917   AST 23 08/08/2017 0810   AST 53 (H) 05/11/2017 0917   ALT 20 08/08/2017 0810   ALT 61 (H) 05/11/2017 0917   BILITOT 0.5 08/08/2017 0810   BILITOT 0.48 05/11/2017 0917       RADIOGRAPHIC STUDIES: I have personally reviewed the radiological images as listed and agreed with the findings in the report. Ct Soft Tissue Neck W Contrast  Result Date: 08/04/2017 CLINICAL  DATA:  Continued surveillance adenoid cystic  carcinoma RIGHT submandibular gland. EXAM: CT NECK WITH CONTRAST TECHNIQUE: Multidetector CT imaging of the neck was performed using the standard protocol following the bolus administration of intravenous contrast. CONTRAST:  125m ISOVUE-300 IOPAMIDOL (ISOVUE-300) INJECTION 61% COMPARISON:  Multiple priors. FINDINGS: Pharynx and larynx: Normal. No mass or swelling. Salivary glands: No inflammation, mass, or stone. RIGHT submandibular gland surgically absent. No local recurrence. Thyroid: Normal. Lymph nodes: None enlarged. Previous identified level 3 RIGHT and level 2 LEFT nodes are smaller. Vascular: Negative. Limited intracranial: Negative. Visualized orbits: Negative. Mastoids and visualized paranasal sinuses: Clear. Skeleton: Spondylosis. Upper chest: Reported separately.  Port-A-Cath on the RIGHT. Other: None. IMPRESSION: No locally recurrent tumor status post RIGHT submandibular gland excision for adenoid cystic carcinoma. Previously identified regional lymph nodes are smaller and non worrisome. Please see CT chest report for additional important findings. Electronically Signed   By: JStaci RighterM.D.   On: 08/04/2017 11:08   Ct Chest W Contrast  Result Date: 08/04/2017 CLINICAL DATA:  History of adenoid cystic carcinoma of the submandibular gland with metastatic lung disease. Initial diagnosis 2015. EXAM: CT CHEST WITH CONTRAST TECHNIQUE: Multidetector CT imaging of the chest was performed during intravenous contrast administration. CONTRAST:  1027mISOVUE-300 IOPAMIDOL (ISOVUE-300) INJECTION 61% COMPARISON:  Chest CT 04/18/2017 FINDINGS: Cardiovascular: The heart is normal in size. No pericardial effusion. The aorta is normal in caliber. No dissection. No atherosclerotic calcifications. The branch vessels are patent. No coronary artery calcifications. Mediastinum/Nodes: Small scattered sub 7 mm mediastinal and hilar lymph nodes. No mass or adenopathy. The esophagus is grossly normal. Small hiatal  hernia. Lungs/Pleura: Pulmonary metastatic disease is again demonstrated. 18.5 x 14.5 mm right upper lobe nodule on image number 57 previously measured 18 x 13.5 mm. 18.5 x 18 mm left upper lobe nodule on image number 47 is unchanged. 15.5 x 13 mm left lower lobe nodule on image number 111 is stable. Right-sided pericardial lesion on image number 45 measures 37.5 X 11.5 mm and is stable. Left-sided pericardial lesion on image number 44 measures 30 x 17.5 mm and is stable. No new pulmonary lesions or acute overlying pulmonary findings. Upper Abdomen: No significant upper abdominal findings. No upper abdominal metastatic disease is identified. Musculoskeletal: No chest wall masses, supraclavicular or axillary lymphadenopathy. The right Port-A-Cath is stable. The thyroid gland is grossly normal. No significant bony findings. IMPRESSION: 1. Stable pulmonary metastatic disease. No new or progressive findings are identified. 2. No mediastinal or hilar mass or adenopathy. 3. No new/acute pulmonary findings. Electronically Signed   By: P.Marijo Sanes.D.   On: 08/04/2017 14:20    All questions were answered. The patient knows to call the clinic with any problems, questions or concerns. No barriers to learning was detected.  I spent 15 minutes counseling the patient face to face. The total time spent in the appointment was 20 minutes and more than 50% was on counseling and review of test results  NiHeath LarkMD 08/08/2017 11:06 AM

## 2017-09-14 ENCOUNTER — Inpatient Hospital Stay (HOSPITAL_BASED_OUTPATIENT_CLINIC_OR_DEPARTMENT_OTHER): Payer: 59 | Admitting: Hematology and Oncology

## 2017-09-14 ENCOUNTER — Inpatient Hospital Stay: Payer: 59 | Attending: Hematology and Oncology

## 2017-09-14 ENCOUNTER — Inpatient Hospital Stay: Payer: 59

## 2017-09-14 ENCOUNTER — Encounter: Payer: Self-pay | Admitting: Hematology and Oncology

## 2017-09-14 VITALS — BP 128/73 | HR 75 | Temp 98.6°F | Resp 19

## 2017-09-14 DIAGNOSIS — R110 Nausea: Secondary | ICD-10-CM | POA: Insufficient documentation

## 2017-09-14 DIAGNOSIS — Z5111 Encounter for antineoplastic chemotherapy: Secondary | ICD-10-CM | POA: Insufficient documentation

## 2017-09-14 DIAGNOSIS — C080 Malignant neoplasm of submandibular gland: Secondary | ICD-10-CM | POA: Diagnosis not present

## 2017-09-14 DIAGNOSIS — C7800 Secondary malignant neoplasm of unspecified lung: Secondary | ICD-10-CM

## 2017-09-14 DIAGNOSIS — R0789 Other chest pain: Secondary | ICD-10-CM | POA: Diagnosis not present

## 2017-09-14 DIAGNOSIS — G620 Drug-induced polyneuropathy: Secondary | ICD-10-CM

## 2017-09-14 DIAGNOSIS — Z79899 Other long term (current) drug therapy: Secondary | ICD-10-CM | POA: Insufficient documentation

## 2017-09-14 DIAGNOSIS — T451X5A Adverse effect of antineoplastic and immunosuppressive drugs, initial encounter: Secondary | ICD-10-CM

## 2017-09-14 LAB — CBC WITH DIFFERENTIAL/PLATELET
BASOS PCT: 1 %
Basophils Absolute: 0 10*3/uL (ref 0.0–0.1)
EOS PCT: 3 %
Eosinophils Absolute: 0.2 10*3/uL (ref 0.0–0.5)
HCT: 37.7 % — ABNORMAL LOW (ref 38.4–49.9)
Hemoglobin: 12.6 g/dL — ABNORMAL LOW (ref 13.0–17.1)
Lymphocytes Relative: 16 %
Lymphs Abs: 0.8 10*3/uL — ABNORMAL LOW (ref 0.9–3.3)
MCH: 30.1 pg (ref 27.2–33.4)
MCHC: 33.4 g/dL (ref 32.0–36.0)
MCV: 90 fL (ref 79.3–98.0)
MONO ABS: 0.6 10*3/uL (ref 0.1–0.9)
Monocytes Relative: 12 %
Neutro Abs: 3.4 10*3/uL (ref 1.5–6.5)
Neutrophils Relative %: 68 %
PLATELETS: 145 10*3/uL (ref 140–400)
RBC: 4.19 MIL/uL — AB (ref 4.20–5.82)
RDW: 15.2 % — AB (ref 11.0–14.6)
WBC: 5 10*3/uL (ref 4.0–10.3)

## 2017-09-14 LAB — COMPREHENSIVE METABOLIC PANEL
ALBUMIN: 4.2 g/dL (ref 3.5–5.0)
ALK PHOS: 84 U/L (ref 40–150)
ALT: 49 U/L (ref 0–55)
ANION GAP: 7 (ref 3–11)
AST: 38 U/L — ABNORMAL HIGH (ref 5–34)
BILIRUBIN TOTAL: 0.3 mg/dL (ref 0.2–1.2)
BUN: 18 mg/dL (ref 7–26)
CALCIUM: 9.1 mg/dL (ref 8.4–10.4)
CO2: 26 mmol/L (ref 22–29)
Chloride: 109 mmol/L (ref 98–109)
Creatinine, Ser: 0.86 mg/dL (ref 0.70–1.30)
GFR calc Af Amer: >60 mL/min (ref >60)
GLUCOSE: 80 mg/dL (ref 70–140)
Potassium: 4.1 mmol/L (ref 3.5–5.1)
SODIUM: 142 mmol/L (ref 136–145)
TOTAL PROTEIN: 6.7 g/dL (ref 6.4–8.3)

## 2017-09-14 LAB — MAGNESIUM: MAGNESIUM: 1.8 mg/dL (ref 1.7–2.4)

## 2017-09-14 MED ORDER — SODIUM CHLORIDE 0.9 % IV SOLN
Freq: Once | INTRAVENOUS | Status: AC
  Administered 2017-09-14: 12:00:00 via INTRAVENOUS
  Filled 2017-09-14: qty 5

## 2017-09-14 MED ORDER — SODIUM CHLORIDE 0.9% FLUSH
10.0000 mL | INTRAVENOUS | Status: DC | PRN
  Administered 2017-09-14: 10 mL
  Filled 2017-09-14: qty 10

## 2017-09-14 MED ORDER — SODIUM CHLORIDE 0.9 % IV SOLN
500.0000 mg/m2 | Freq: Once | INTRAVENOUS | Status: AC
  Administered 2017-09-14: 1080 mg via INTRAVENOUS
  Filled 2017-09-14: qty 54

## 2017-09-14 MED ORDER — DOXORUBICIN HCL CHEMO IV INJECTION 2 MG/ML
50.0000 mg/m2 | Freq: Once | INTRAVENOUS | Status: AC
  Administered 2017-09-14: 108 mg via INTRAVENOUS
  Filled 2017-09-14: qty 54

## 2017-09-14 MED ORDER — SODIUM CHLORIDE 0.9 % IV SOLN
Freq: Once | INTRAVENOUS | Status: AC
  Administered 2017-09-14: 09:00:00 via INTRAVENOUS

## 2017-09-14 MED ORDER — PALONOSETRON HCL INJECTION 0.25 MG/5ML
INTRAVENOUS | Status: AC
  Filled 2017-09-14: qty 5

## 2017-09-14 MED ORDER — FAMOTIDINE IN NACL 20-0.9 MG/50ML-% IV SOLN
20.0000 mg | Freq: Two times a day (BID) | INTRAVENOUS | Status: DC
  Administered 2017-09-14: 20 mg via INTRAVENOUS

## 2017-09-14 MED ORDER — FAMOTIDINE IN NACL 20-0.9 MG/50ML-% IV SOLN
INTRAVENOUS | Status: AC
  Filled 2017-09-14: qty 50

## 2017-09-14 MED ORDER — POTASSIUM CHLORIDE 2 MEQ/ML IV SOLN
Freq: Once | INTRAVENOUS | Status: AC
  Administered 2017-09-14: 10:00:00 via INTRAVENOUS
  Filled 2017-09-14: qty 10

## 2017-09-14 MED ORDER — SODIUM CHLORIDE 0.9% FLUSH
10.0000 mL | Freq: Once | INTRAVENOUS | Status: AC
  Administered 2017-09-14: 10 mL
  Filled 2017-09-14: qty 10

## 2017-09-14 MED ORDER — PALONOSETRON HCL INJECTION 0.25 MG/5ML
0.2500 mg | Freq: Once | INTRAVENOUS | Status: AC
  Administered 2017-09-14: 0.25 mg via INTRAVENOUS

## 2017-09-14 MED ORDER — HEPARIN SOD (PORK) LOCK FLUSH 100 UNIT/ML IV SOLN
500.0000 [IU] | Freq: Once | INTRAVENOUS | Status: AC | PRN
  Administered 2017-09-14: 500 [IU]
  Filled 2017-09-14: qty 5

## 2017-09-14 MED ORDER — PEGFILGRASTIM 6 MG/0.6ML ~~LOC~~ PSKT
PREFILLED_SYRINGE | SUBCUTANEOUS | Status: AC
  Filled 2017-09-14: qty 0.6

## 2017-09-14 MED ORDER — PEGFILGRASTIM 6 MG/0.6ML ~~LOC~~ PSKT
6.0000 mg | PREFILLED_SYRINGE | Freq: Once | SUBCUTANEOUS | Status: AC
  Administered 2017-09-14: 6 mg via SUBCUTANEOUS

## 2017-09-14 MED ORDER — CISPLATIN CHEMO INJECTION 100MG/100ML
100.0000 mg | Freq: Once | INTRAVENOUS | Status: AC
  Administered 2017-09-14: 100 mg via INTRAVENOUS
  Filled 2017-09-14: qty 100

## 2017-09-14 NOTE — Patient Instructions (Signed)
[place] Discharge Instructions for Patients Receiving Chemotherapy  Today you received the following chemotherapy agents Adriamycin,Cytoxan,Cisplatin  To help prevent nausea and vomiting after your treatment, we encourage you to take your nausea medication as directed   If you develop nausea and vomiting that is not controlled by your nausea medication, call the clinic.   BELOW ARE SYMPTOMS THAT SHOULD BE REPORTED IMMEDIATELY:  *FEVER GREATER THAN 100.5 F  *CHILLS WITH OR WITHOUT FEVER  NAUSEA AND VOMITING THAT IS NOT CONTROLLED WITH YOUR NAUSEA MEDICATION  *UNUSUAL SHORTNESS OF BREATH  *UNUSUAL BRUISING OR BLEEDING  TENDERNESS IN MOUTH AND THROAT WITH OR WITHOUT PRESENCE OF ULCERS  *URINARY PROBLEMS  *BOWEL PROBLEMS  UNUSUAL RASH Items with * indicate a potential emergency and should be followed up as soon as possible.  Feel free to call the clinic should you have any questions or concerns. The clinic phone number is [phone].  Please show the [place] at check-in to the Emergency Department and triage nurse.

## 2017-09-14 NOTE — Progress Notes (Signed)
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[place]) I have reviewed the imaging study Last imaging study showed stable disease Echocardiogram is within normal limits He tolerated treatment with mild expected side effects of treatment I recommend we proceed to complete 6 cycles of chemotherapy He agreed with the plan of care I have shared with him data using Levantinib a phase 2 clinical trial for metastatic adenocystic cancer We will discuss that in the future after completion of 6 cycles of therapy  Metastasis to lung [facility]) He is mildly symptomatic with atypical chest discomfort He denies cough or shortness of breath Imaging study shows stable lung nodules  Peripheral neuropathy due to chemotherapy [facility]) he has mild peripheral neuropathy, likely related to side effects of treatment. It is only mild, not bothering the patient. I will observe for now If it gets worse in the future, I will consider modifying the dose of the treatment   Chemotherapy-induced nausea He has chemotherapy induced delayed nausea and vomiting He will continue taking antiemetics as prescribed   No orders of the defined types were placed in this encounter.   INTERVAL HISTORY: Please see below for problem oriented charting. 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 No acute intracranial  abnormality. 2. Swollen right submandibular gland containing prominent calcifications consistent with submandibular gland stones. Submandibular gland enlargement is most likely inflammatory. Malignancy cannot be entirely excluded .      07/12/2014 Pathology Results    Submandibular gland, Right - ADENOID CYSTIC CARCINOMA, GRADE I OF III, SEE COMMENT. - POSITIVE FOR PERINEURAL INVASION. - TUMOR INVOLVES SURGICAL EXCISIONAL MARGIN - SEE TUMOR SYNOPTIC TEMPLATE BELOW. Microscopic Comment ONCOLOGY TABLE - SALIVARY GLAND 1. Specimen: Right submandibular gland 2. Procedure: Excision. 3. Tumor site and laterality: Right submandibular gland 4. Tumor focality: Unifocal 5. Maximum tumor size (cm): 3.8 cm 6. Histologic type: Adenoid cystic carcinoma. 7. Grade: I of III 8. Margins: Positive Distance of tumor from closest margin: Present at margin 9. Perineural invasion: Present 10. Lymph-Vascular invasion: Absent. 11. Lymph nodes: # examined - 0; # positive - N/A; extracapsular extension - N/A 12. TNM code: pT2, pNX, pMX      07/12/2014 Surgery    Right submandibular gland excision       07/24/2014 Miscellaneous    The patient was seen by radiation oncologist locally who offered him adjuvant radiation treatment.  The patient obtained second opinion at [facility] who recommended against adjuvant radiation treatment.  The patient finally decided not proceed with radiation therapy      07/29/2014 Imaging    No findings specific for metastatic disease in the chest or abdomen. 4 mm subpleural nodule in the medial left lower lobe, likely reflecting a benign subpleural lymph node, although technically indeterminate. Attention on follow-up is suggested.       07/30/2014 Imaging    MR face: Postop resection of right  submandibular gland for adenoid cystic carcinoma. No evidence of perineural spread. No skullbase or cavernous sinus involvement.       08/12/2014 Surgery    Right  level I, 2 neck selective dissection       08/12/2014 Pathology Results    Lymph nodes, radical neck dissection, limited right neck - BENIGN SALIVARY GLAND TISSUE; NEGATIVE FOR ATYPIA OR MALIGNANCY. - BENIGN SKIN WITH DERMAL AND SUBCUTANEOUS SCAR AND INFLAMMATORY TISSUE REACTION. - TWO INTRA-SALIVARY GLAND LYMPH NODES, NEGATIVE FOR TUMOR (0/2) - ONE LEVEL ONE-A LYMPH NODE, NEGATIVE FOR TUMOR (0/1). - SIX LEVEL TWO LYMPH NODES, NEGATIVE FOR TUMOR (0/6)      04/07/2015 Imaging    MR neck: 1. Status post resection of right submandibular gland. 2. No evidence for perineural spread or tumor recurrence. 3. Stable central and bilateral foraminal narrowing at C6-7.      09/25/2015 Imaging    MR neck: Stable exam. No evidence of residual or recurrent right submandibular carcinoma.      07/08/2016 Procedure    He underwent repeat excision of right submandibular mass: A 2 x 3 cm ellipse was made around mass incorporating the scar. This was carried down through skin and subcutaneous fat and the dissection was completed at the level of the sternomastoid muscle. Examination of the specimen revealed complete removal. This was sent for pathologic interpretation.           07/09/2016 Pathology Results    Adenoid cystic carcinoma (margins free of neoplasm)      08/06/2016 Imaging    CT scan of chest, abdomen and pelvis: 1. There are now multiple pulmonary nodules consistent with metastatic disease to the lungs. One nodule was present previously, measuring 4 mm, noted along the pleural margin of the left lower lobe. This is most likely a benign granuloma. The remaining visualized nodules are new consistent with metastatic disease, given the patient's history. 2. No other findings of metastatic disease within the chest. 3. No evidence of metastatic disease in the abdomen or pelvis. 4. No acute findings.      08/06/2016 Imaging    Ct neck: 1. Multiple new bilateral apical pulmonary nodules measuring up to 1.3  cm and concerning for metastatic disease. 2. 22 x 7 mm right level III lymph node, enlarged from 2016 and indeterminate. 3. Prior right submandibular gland resection without evidence of locally recurrent mass.      08/06/2016 Imaging    MR face: Prior right submandibular gland resection without evidence of recurrent tumor      10/12/2016 Imaging    There is no new mediastinal or hilar lymphadenopathy.  The heart is normal in size and there is no pericardial or pleural effusion.  Bilateral pulmonary nodules have slightly increased in size, suspicious for metastatic disease. For instance a 14 mm nodule on image 39 previously measured 12 mm. The largest nodule in the right lower lobe measures 24 mm in diameter.  Limited imaging through the upper abdomen shows normal adrenal glands.      10/12/2016 Imaging    CT neck 1.No evidence for primary or nodal recurrence in the neck. 2.Bilateral pulmonary nodules.      02/16/2017 Imaging    Ct chest showed enlarging pulmonary metastases.      02/16/2017 Imaging    1. Unchanged examination of the neck with stable size of right level 3 and left level IIa lymph nodes. 2. Please see dedicated report for concomitant chest CT for findings below thoracic inlet.  03/25/2017 Procedure    Technically successful CT guided core needle core biopsy of indeterminate enlarging left lower lobe pulmonary nodule.      03/25/2017 Pathology Results    Lung, biopsy, Left lower lobe - FINDINGS CONSISTENT WITH METASTATIC ADENOID CYSTIC CARCINOMA, SEE COMMENT. Microscopic Comment There is a small focus of tumor with similar morphology to the patient's prior adenoid cystic carcinoma of the submandibular gland ([id number], reviewed).       04/18/2017 Imaging    Progressive bilateral pulmonary metastases, as above. Three subcentimeter hypervascular lesions in the right liver, unchanged from March 2018, indeterminate.      05/05/2017 Procedure    Successful  placement of a right internal jugular approach power injectable Port-A-Cath. The catheter is ready for immediate use      05/16/2017 -  Chemotherapy    The patient had chemotherapy with cisplatin, cytoxan and doxorubicin      07/27/2017 Imaging    ECHO: LV EF: 55% -  60%      08/04/2017 Imaging    CT neck No locally recurrent tumor status post RIGHT submandibular gland excision for adenoid cystic carcinoma.  Previously identified regional lymph nodes are smaller and non worrisome.  Please see CT chest report for additional important findings.      08/04/2017 Imaging    CT chest 1. Stable pulmonary metastatic disease. No new or progressive findings are identified. 2. No mediastinal or hilar mass or adenopathy. 3. No new/acute pulmonary findings.       REVIEW OF SYSTEMS:   Constitutional: Denies fevers, chills or abnormal weight loss Eyes: Denies blurriness of vision Ears, nose, mouth, throat, and face: Denies mucositis or sore throat Respiratory: Denies cough, dyspnea or wheezes Cardiovascular: Denies palpitation, chest discomfort or lower extremity swelling Gastrointestinal:  Denies nausea, heartburn or change in bowel habits Skin: Denies abnormal skin rashes Lymphatics: Denies new lymphadenopathy or easy bruising Neurological:Denies numbness, tingling or new weaknesses Behavioral/Psych: Mood is stable, no new changes  All other systems were reviewed with the patient and are negative.  I have reviewed the past medical history, past surgical history, social history and family history with the patient and they are unchanged from previous note.  ALLERGIES:  is allergic to hydrocodone.  MEDICATIONS:  Current Outpatient Medications  Medication Sig Dispense Refill  . DULoxetine (CYMBALTA) 60 MG capsule Take 1 capsule (60 mg total) by mouth daily. 30 capsule 0  . famotidine (PEPCID AC) 10 MG tablet Take 10 mg daily as needed by mouth for heartburn or indigestion.    Marland Kitchen  ibuprofen (ADVIL,MOTRIN) 200 MG tablet Take 200-800 mg 2 (two) times daily as needed by mouth for moderate pain.    Marland Kitchen lidocaine-prilocaine (EMLA) cream Apply 1 application topically as needed. 30 g 6  . loratadine (CLARITIN) 10 MG tablet Take 10 mg by mouth daily as needed for allergies.     . Multiple Vitamin (MULTIVITAMIN WITH MINERALS) TABS tablet Take 1 tablet daily by mouth.    . naproxen sodium (ALEVE) 220 MG tablet Take 440 mg daily as needed by mouth (pain).    . ondansetron (ZOFRAN) 8 MG tablet TAKE 1 TABLET BY MOUTH TWICE DAILY AS NEEDED --  START  ON  THE  THIRD  DAY  AFTER  CHEMOTHERAPY 90 tablet 1  . prochlorperazine (COMPAZINE) 10 MG tablet TAKE 1 TABLET BY MOUTH EVERY 6 HOURS AS NEEDED FOR NAUSEA AND VOMITING 90 tablet 1   No current facility-administered medications for this visit.  PHYSICAL EXAMINATION: ECOG PERFORMANCE STATUS: 1 - Symptomatic but completely ambulatory  Vitals:   09/14/17 0821  BP: 125/78  Pulse: 68  Resp: 20  Temp: 98.6 F (37 C)  SpO2: 100%   Filed Weights   09/14/17 0821  Weight: 211 lb 11.2 oz (96 kg)    GENERAL:alert, no distress and comfortable SKIN: skin color, texture, turgor are normal, no rashes or significant lesions EYES: normal, Conjunctiva are pink and non-injected, sclera clear OROPHARYNX:no exudate, no erythema and lips, buccal mucosa, and tongue normal  NECK: supple, thyroid normal size, non-tender, without nodularity LYMPH:  no palpable lymphadenopathy in the cervical, axillary or inguinal LUNGS: clear to auscultation and percussion with normal breathing effort HEART: regular rate & rhythm and no murmurs and no lower extremity edema ABDOMEN:abdomen soft, non-tender and normal bowel sounds Musculoskeletal:no cyanosis of digits and no clubbing  NEURO: alert & oriented x 3 with fluent speech, no focal motor/sensory deficits  LABORATORY DATA:  I have reviewed the data as listed    Component Value Date/Time   NA 140  08/08/2017 0810   NA 142 05/11/2017 0917   K 4.1 08/08/2017 0810   K 4.1 05/11/2017 0917   CL 107 08/08/2017 0810   CO2 25 08/08/2017 0810   CO2 26 05/11/2017 0917   GLUCOSE 77 08/08/2017 0810   GLUCOSE 82 05/11/2017 0917   BUN 14 08/08/2017 0810   BUN 14.2 05/11/2017 0917   CREATININE 0.93 08/08/2017 0810   CREATININE 0.9 05/11/2017 0917   CALCIUM 10.0 08/08/2017 0810   CALCIUM 9.6 05/11/2017 0917   PROT 7.2 08/08/2017 0810   PROT 7.7 05/11/2017 0917   ALBUMIN 4.4 08/08/2017 0810   ALBUMIN 4.2 05/11/2017 0917   AST 23 08/08/2017 0810   AST 53 (H) 05/11/2017 0917   ALT 20 08/08/2017 0810   ALT 61 (H) 05/11/2017 0917   ALKPHOS 78 08/08/2017 0810   ALKPHOS 114 05/11/2017 0917   BILITOT 0.5 08/08/2017 0810   BILITOT 0.48 05/11/2017 0917   GFRNONAA >60 08/08/2017 0810   GFRAA >60 08/08/2017 0810    No results found for: SPEP, UPEP  Lab Results  Component Value Date   WBC 5.0 09/14/2017   NEUTROABS 3.4 09/14/2017   HGB 12.6 (L) 09/14/2017   HCT 37.7 (L) 09/14/2017   MCV 90.0 09/14/2017   PLT 145 09/14/2017      Chemistry      Component Value Date/Time   NA 140 08/08/2017 0810   NA 142 05/11/2017 0917   K 4.1 08/08/2017 0810   K 4.1 05/11/2017 0917   CL 107 08/08/2017 0810   CO2 25 08/08/2017 0810   CO2 26 05/11/2017 0917   BUN 14 08/08/2017 0810   BUN 14.2 05/11/2017 0917   CREATININE 0.93 08/08/2017 0810   CREATININE 0.9 05/11/2017 0917      Component Value Date/Time   CALCIUM 10.0 08/08/2017 0810   CALCIUM 9.6 05/11/2017 0917   ALKPHOS 78 08/08/2017 0810   ALKPHOS 114 05/11/2017 0917   AST 23 08/08/2017 0810   AST 53 (H) 05/11/2017 0917   ALT 20 08/08/2017 0810   ALT 61 (H) 05/11/2017 0917   BILITOT 0.5 08/08/2017 0810   BILITOT 0.48 05/11/2017 0917       All questions were answered. The patient knows to call the clinic with any problems, questions or concerns. No barriers to learning was detected.  I spent 15 minutes counseling the patient face  to face. The total time spent in  the appointment was 20 minutes and more than 50% was on counseling and review of test results  Heath Lark, MD 09/14/2017 8:43 AM

## 2017-09-14 NOTE — Assessment & Plan Note (Signed)
he has mild peripheral neuropathy, likely related to side effects of treatment. It is only mild, not bothering the patient. I will observe for now If it gets worse in the future, I will consider modifying the dose of the treatment  

## 2017-09-14 NOTE — Assessment & Plan Note (Signed)
He has chemotherapy induced delayed nausea and vomiting He will continue taking antiemetics as prescribed

## 2017-09-14 NOTE — Assessment & Plan Note (Signed)
He is mildly symptomatic with atypical chest discomfort He denies cough or shortness of breath Imaging study shows stable lung nodules

## 2017-09-14 NOTE — Assessment & Plan Note (Signed)
I have reviewed the imaging study Last imaging study showed stable disease Echocardiogram is within normal limits He tolerated treatment with mild expected side effects of treatment I recommend we proceed to complete 6 cycles of chemotherapy He agreed with the plan of care I have shared with him data using Levantinib a phase 2 clinical trial for metastatic adenocystic cancer We will discuss that in the future after completion of 6 cycles of therapy

## 2017-09-15 ENCOUNTER — Telehealth: Payer: Self-pay | Admitting: Hematology and Oncology

## 2017-09-15 NOTE — Telephone Encounter (Signed)
Per 5/8 no los

## 2017-10-12 ENCOUNTER — Inpatient Hospital Stay (HOSPITAL_BASED_OUTPATIENT_CLINIC_OR_DEPARTMENT_OTHER): Payer: 59 | Admitting: Medical

## 2017-10-12 ENCOUNTER — Other Ambulatory Visit: Payer: Self-pay

## 2017-10-12 ENCOUNTER — Inpatient Hospital Stay: Payer: 59

## 2017-10-12 ENCOUNTER — Encounter: Payer: Self-pay | Admitting: Hematology and Oncology

## 2017-10-12 ENCOUNTER — Telehealth: Payer: Self-pay | Admitting: Hematology and Oncology

## 2017-10-12 ENCOUNTER — Inpatient Hospital Stay (HOSPITAL_BASED_OUTPATIENT_CLINIC_OR_DEPARTMENT_OTHER): Payer: 59 | Admitting: Hematology and Oncology

## 2017-10-12 ENCOUNTER — Inpatient Hospital Stay: Payer: 59 | Attending: Hematology and Oncology

## 2017-10-12 ENCOUNTER — Other Ambulatory Visit: Payer: Self-pay | Admitting: Medical

## 2017-10-12 VITALS — BP 126/82 | HR 69 | Temp 97.9°F | Resp 18

## 2017-10-12 DIAGNOSIS — Z5111 Encounter for antineoplastic chemotherapy: Secondary | ICD-10-CM | POA: Diagnosis not present

## 2017-10-12 DIAGNOSIS — C080 Malignant neoplasm of submandibular gland: Secondary | ICD-10-CM

## 2017-10-12 DIAGNOSIS — T8090XA Unspecified complication following infusion and therapeutic injection, initial encounter: Secondary | ICD-10-CM

## 2017-10-12 DIAGNOSIS — J359 Chronic disease of tonsils and adenoids, unspecified: Secondary | ICD-10-CM

## 2017-10-12 DIAGNOSIS — G620 Drug-induced polyneuropathy: Secondary | ICD-10-CM | POA: Insufficient documentation

## 2017-10-12 DIAGNOSIS — K5909 Other constipation: Secondary | ICD-10-CM

## 2017-10-12 DIAGNOSIS — R0789 Other chest pain: Secondary | ICD-10-CM

## 2017-10-12 DIAGNOSIS — T451X5A Adverse effect of antineoplastic and immunosuppressive drugs, initial encounter: Secondary | ICD-10-CM

## 2017-10-12 DIAGNOSIS — R110 Nausea: Secondary | ICD-10-CM | POA: Diagnosis not present

## 2017-10-12 DIAGNOSIS — C7800 Secondary malignant neoplasm of unspecified lung: Secondary | ICD-10-CM

## 2017-10-12 LAB — COMPREHENSIVE METABOLIC PANEL
ALT: 20 U/L (ref 0–55)
AST: 31 U/L (ref 5–34)
Albumin: 4.4 g/dL (ref 3.5–5.0)
Alkaline Phosphatase: 68 U/L (ref 40–150)
Anion gap: 9 (ref 3–11)
BUN: 16 mg/dL (ref 7–26)
CHLORIDE: 106 mmol/L (ref 98–109)
CO2: 24 mmol/L (ref 22–29)
CREATININE: 0.91 mg/dL (ref 0.70–1.30)
Calcium: 9.2 mg/dL (ref 8.4–10.4)
GFR calc non Af Amer: >60 mL/min (ref >60)
Glucose, Bld: 88 mg/dL (ref 70–140)
POTASSIUM: 4.3 mmol/L (ref 3.5–5.1)
SODIUM: 139 mmol/L (ref 136–145)
Total Bilirubin: 0.5 mg/dL (ref 0.2–1.2)
Total Protein: 7 g/dL (ref 6.4–8.3)

## 2017-10-12 LAB — CBC WITH DIFFERENTIAL/PLATELET
BASOS ABS: 0 10*3/uL (ref 0.0–0.1)
BASOS PCT: 1 %
EOS ABS: 0 10*3/uL (ref 0.0–0.5)
EOS PCT: 1 %
HCT: 37.4 % — ABNORMAL LOW (ref 38.4–49.9)
Hemoglobin: 12.6 g/dL — ABNORMAL LOW (ref 13.0–17.1)
Lymphocytes Relative: 17 %
Lymphs Abs: 0.6 10*3/uL — ABNORMAL LOW (ref 0.9–3.3)
MCH: 30.2 pg (ref 27.2–33.4)
MCHC: 33.7 g/dL (ref 32.0–36.0)
MCV: 89.6 fL (ref 79.3–98.0)
Monocytes Absolute: 0.6 10*3/uL (ref 0.1–0.9)
Monocytes Relative: 16 %
Neutro Abs: 2.4 10*3/uL (ref 1.5–6.5)
Neutrophils Relative %: 65 %
PLATELETS: 196 10*3/uL (ref 140–400)
RBC: 4.18 MIL/uL — AB (ref 4.20–5.82)
RDW: 14.6 % (ref 11.0–14.6)
WBC: 3.6 10*3/uL — AB (ref 4.0–10.3)

## 2017-10-12 LAB — MAGNESIUM: MAGNESIUM: 1.9 mg/dL (ref 1.7–2.4)

## 2017-10-12 MED ORDER — FOSAPREPITANT DIMEGLUMINE INJECTION 150 MG
Freq: Once | INTRAVENOUS | Status: AC
  Administered 2017-10-12: 12:00:00 via INTRAVENOUS
  Filled 2017-10-12: qty 5

## 2017-10-12 MED ORDER — HEPARIN SOD (PORK) LOCK FLUSH 100 UNIT/ML IV SOLN
500.0000 [IU] | Freq: Once | INTRAVENOUS | Status: AC | PRN
  Administered 2017-10-12: 500 [IU]
  Filled 2017-10-12: qty 5

## 2017-10-12 MED ORDER — SODIUM CHLORIDE 0.9 % IV SOLN
500.0000 mg/m2 | Freq: Once | INTRAVENOUS | Status: AC
  Administered 2017-10-12: 1080 mg via INTRAVENOUS
  Filled 2017-10-12: qty 54

## 2017-10-12 MED ORDER — DOXORUBICIN HCL CHEMO IV INJECTION 2 MG/ML
50.0000 mg/m2 | Freq: Once | INTRAVENOUS | Status: AC
  Administered 2017-10-12: 108 mg via INTRAVENOUS
  Filled 2017-10-12: qty 54

## 2017-10-12 MED ORDER — PEGFILGRASTIM 6 MG/0.6ML ~~LOC~~ PSKT
6.0000 mg | PREFILLED_SYRINGE | Freq: Once | SUBCUTANEOUS | Status: AC
  Administered 2017-10-12: 6 mg via SUBCUTANEOUS

## 2017-10-12 MED ORDER — FAMOTIDINE IN NACL 20-0.9 MG/50ML-% IV SOLN
INTRAVENOUS | Status: AC
  Filled 2017-10-12: qty 50

## 2017-10-12 MED ORDER — PALONOSETRON HCL INJECTION 0.25 MG/5ML
0.2500 mg | Freq: Once | INTRAVENOUS | Status: AC
  Administered 2017-10-12: 0.25 mg via INTRAVENOUS

## 2017-10-12 MED ORDER — PEGFILGRASTIM 6 MG/0.6ML ~~LOC~~ PSKT
PREFILLED_SYRINGE | SUBCUTANEOUS | Status: AC
  Filled 2017-10-12: qty 0.6

## 2017-10-12 MED ORDER — SODIUM CHLORIDE 0.9% FLUSH
10.0000 mL | INTRAVENOUS | Status: DC | PRN
  Administered 2017-10-12: 10 mL
  Filled 2017-10-12: qty 10

## 2017-10-12 MED ORDER — SODIUM CHLORIDE 0.9 % IV SOLN
Freq: Once | INTRAVENOUS | Status: AC
  Administered 2017-10-12: 09:00:00 via INTRAVENOUS

## 2017-10-12 MED ORDER — POTASSIUM CHLORIDE 2 MEQ/ML IV SOLN
Freq: Once | INTRAVENOUS | Status: AC
  Administered 2017-10-12: 10:00:00 via INTRAVENOUS
  Filled 2017-10-12: qty 10

## 2017-10-12 MED ORDER — SODIUM CHLORIDE 0.9 % IV SOLN
100.0000 mg | Freq: Once | INTRAVENOUS | Status: AC
  Administered 2017-10-12: 100 mg via INTRAVENOUS
  Filled 2017-10-12: qty 100

## 2017-10-12 MED ORDER — PALONOSETRON HCL INJECTION 0.25 MG/5ML
INTRAVENOUS | Status: AC
Start: 2017-10-12 — End: ?
  Filled 2017-10-12: qty 5

## 2017-10-12 MED ORDER — FAMOTIDINE IN NACL 20-0.9 MG/50ML-% IV SOLN
20.0000 mg | Freq: Once | INTRAVENOUS | Status: AC
Start: 2017-10-12 — End: 2017-10-12
  Administered 2017-10-12: 20 mg via INTRAVENOUS

## 2017-10-12 MED ORDER — SODIUM CHLORIDE 0.9% FLUSH
10.0000 mL | Freq: Once | INTRAVENOUS | Status: AC
  Administered 2017-10-12: 10 mL
  Filled 2017-10-12: qty 10

## 2017-10-12 NOTE — Progress Notes (Signed)
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[place]) Last imaging study showed stable disease Echocardiogram is within normal limits He tolerated treatment with mild expected side effects of treatment I recommend we proceed to complete 6 cycles of chemotherapy He agreed with the plan of care I plan to repeat CT scan of the chest in July I have shared with him data using Levantinib a phase 2 clinical trial for metastatic adenocystic cancer We will discuss that in the future after completion of 6 cycles of therapy whether we will go on surveillance or start him on Levantinib  Chemotherapy-induced nausea He has chemotherapy induced delayed nausea and vomiting He will continue taking antiemetics as prescribed  Peripheral neuropathy due to chemotherapy [facility]) he has mild peripheral neuropathy, likely related to side effects of treatment. It is only mild, not bothering the patient. I will observe for now If it gets worse in the future, I will consider modifying the dose of the treatment   Other constipation This is likely induced by side effects of treatment and that could certainly contribute to nausea I recommend over-the-counter laxatives   Orders Placed This Encounter  Procedures  . CT CHEST W CONTRAST    Standing Status:   Future    Standing Expiration Date:   10/13/2018    Order Specific Question:   If indicated for the ordered procedure, I authorize the administration of contrast media per Radiology protocol    Answer:   Yes    Order Specific Question:   Preferred imaging location?    Answer:   [facility]    Order Specific Question:   Radiology Contrast Protocol - do NOT remove file path    Answer:   [email or web address]    INTERVAL HISTORY: Please see below for problem oriented charting. 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 No acute intracranial abnormality. 2. Swollen right submandibular gland containing prominent calcifications consistent with submandibular gland stones. Submandibular gland enlargement is most likely inflammatory. Malignancy cannot be entirely excluded .      07/12/2014 Pathology Results    Submandibular gland, Right - ADENOID CYSTIC CARCINOMA, GRADE I OF III, SEE COMMENT. - POSITIVE FOR PERINEURAL INVASION. - TUMOR INVOLVES SURGICAL EXCISIONAL MARGIN - SEE TUMOR SYNOPTIC TEMPLATE BELOW. Microscopic Comment ONCOLOGY TABLE - SALIVARY GLAND 1. Specimen: Right submandibular gland 2. Procedure: Excision. 3. Tumor site and laterality: Right submandibular gland 4. Tumor focality: Unifocal 5. Maximum tumor size (cm): 3.8 cm 6. Histologic type: Adenoid cystic carcinoma. 7. Grade: I of III 8. Margins: Positive Distance of tumor from closest margin: Present at margin 9. Perineural invasion: Present 10. Lymph-Vascular invasion: Absent. 11. Lymph nodes: # examined - 0; # positive - N/A; extracapsular extension - N/A 12. TNM code: pT2, pNX, pMX      07/12/2014 Surgery    Right submandibular gland excision       07/24/2014 Miscellaneous    The patient was  seen by radiation oncologist locally who offered him adjuvant radiation treatment.  The patient obtained second opinion at [facility] who recommended against adjuvant radiation treatment.  The patient finally decided not proceed with radiation therapy       07/29/2014 Imaging    No findings specific for metastatic disease in the chest or abdomen. 4 mm subpleural nodule in the medial left lower lobe, likely reflecting a benign subpleural lymph node, although technically indeterminate. Attention on follow-up is suggested.       07/30/2014 Imaging    MR face: Postop resection of right submandibular gland for adenoid cystic carcinoma. No evidence of perineural spread. No skullbase or cavernous sinus involvement.       08/12/2014 Surgery    Right level I, 2 neck selective dissection       08/12/2014 Pathology Results    Lymph nodes, radical neck dissection, limited right neck - BENIGN SALIVARY GLAND TISSUE; NEGATIVE FOR ATYPIA OR MALIGNANCY. - BENIGN SKIN WITH DERMAL AND SUBCUTANEOUS SCAR AND INFLAMMATORY TISSUE REACTION. - TWO INTRA-SALIVARY GLAND LYMPH NODES, NEGATIVE FOR TUMOR (0/2) - ONE LEVEL ONE-A LYMPH NODE, NEGATIVE FOR TUMOR (0/1). - SIX LEVEL TWO LYMPH NODES, NEGATIVE FOR TUMOR (0/6)      04/07/2015 Imaging    MR neck: 1. Status post resection of right submandibular gland. 2. No evidence for perineural spread or tumor recurrence. 3. Stable central and bilateral foraminal narrowing at C6-7.      09/25/2015 Imaging    MR neck: Stable exam. No evidence of residual or recurrent right submandibular carcinoma.      07/08/2016 Procedure    He underwent repeat excision of right submandibular mass: A 2 x 3 cm ellipse was made around mass incorporating the scar. This was carried down through skin and subcutaneous fat and the dissection was completed at the level of the sternomastoid muscle. Examination of the specimen revealed complete removal. This was sent for pathologic interpretation.           07/09/2016 Pathology Results    Adenoid cystic carcinoma (margins free of neoplasm)      08/06/2016 Imaging    CT scan of chest, abdomen and pelvis: 1. There are now multiple pulmonary nodules consistent with metastatic disease to the lungs. One  nodule was present previously, measuring 4 mm, noted along the pleural margin of the left lower lobe. This is most likely a benign granuloma. The remaining visualized nodules are new consistent with metastatic disease, given the patient's history. 2. No other findings of metastatic disease within the chest. 3. No evidence of metastatic disease in the abdomen or pelvis. 4. No acute findings.      08/06/2016 Imaging    Ct neck: 1. Multiple new bilateral apical pulmonary nodules measuring up to 1.3 cm and concerning for metastatic disease. 2. 22 x 7 mm right level III lymph node, enlarged from 2016 and indeterminate. 3. Prior right submandibular gland resection without evidence of locally recurrent mass.      08/06/2016 Imaging    MR face: Prior right submandibular gland resection without evidence of recurrent tumor      10/12/2016 Imaging    There is no new mediastinal or hilar lymphadenopathy.  The heart is normal in size and there is no pericardial or pleural effusion.  Bilateral pulmonary nodules have slightly increased in size, suspicious for metastatic disease. For instance a 14 mm nodule on image 39 previously measured 12 mm. The largest nodule in the right lower lobe  measures 24 mm in diameter.  Limited imaging through the upper abdomen shows normal adrenal glands.      10/12/2016 Imaging    CT neck 1.No evidence for primary or nodal recurrence in the neck. 2.Bilateral pulmonary nodules.      02/16/2017 Imaging    Ct chest showed enlarging pulmonary metastases.      02/16/2017 Imaging    1. Unchanged examination of the neck with stable size of right level 3 and left level IIa lymph nodes. 2. Please see dedicated report for concomitant chest CT for findings below thoracic inlet.      03/25/2017 Procedure    Technically successful CT guided core needle core biopsy of indeterminate enlarging left lower lobe pulmonary nodule.      03/25/2017 Pathology Results    Lung, biopsy,  Left lower lobe - FINDINGS CONSISTENT WITH METASTATIC ADENOID CYSTIC CARCINOMA, SEE COMMENT. Microscopic Comment There is a small focus of tumor with similar morphology to the patient's prior adenoid cystic carcinoma of the submandibular gland ([id number], reviewed).       04/18/2017 Imaging    Progressive bilateral pulmonary metastases, as above. Three subcentimeter hypervascular lesions in the right liver, unchanged from March 2018, indeterminate.      05/05/2017 Procedure    Successful placement of a right internal jugular approach power injectable Port-A-Cath. The catheter is ready for immediate use      05/16/2017 -  Chemotherapy    The patient had chemotherapy with cisplatin, cytoxan and doxorubicin      07/27/2017 Imaging    ECHO: LV EF: 55% -  60%      08/04/2017 Imaging    CT neck No locally recurrent tumor status post RIGHT submandibular gland excision for adenoid cystic carcinoma.  Previously identified regional lymph nodes are smaller and non worrisome.  Please see CT chest report for additional important findings.      08/04/2017 Imaging    CT chest 1. Stable pulmonary metastatic disease. No new or progressive findings are identified. 2. No mediastinal or hilar mass or adenopathy. 3. No new/acute pulmonary findings.       REVIEW OF SYSTEMS:   Constitutional: Denies fevers, chills or abnormal weight loss Eyes: Denies blurriness of vision Ears, nose, mouth, throat, and face: Denies mucositis or sore throat Respiratory: Denies cough, dyspnea or wheezes Cardiovascular: Denies palpitation, chest discomfort or lower extremity swelling Skin: Denies abnormal skin rashes Lymphatics: Denies new lymphadenopathy or easy bruising Neurological:Denies numbness, tingling or new weaknesses Behavioral/Psych: Mood is stable, no new changes  All other systems were reviewed with the patient and are negative.  I have reviewed the past medical history, past surgical history,  social history and family history with the patient and they are unchanged from previous note.  ALLERGIES:  is allergic to hydrocodone.  MEDICATIONS:  Current Outpatient Medications  Medication Sig Dispense Refill  . DULoxetine (CYMBALTA) 60 MG capsule Take 1 capsule (60 mg total) by mouth daily. 30 capsule 0  . famotidine (PEPCID AC) 10 MG tablet Take 10 mg daily as needed by mouth for heartburn or indigestion.    Marland Kitchen ibuprofen (ADVIL,MOTRIN) 200 MG tablet Take 200-800 mg 2 (two) times daily as needed by mouth for moderate pain.    Marland Kitchen lidocaine-prilocaine (EMLA) cream Apply 1 application topically as needed. 30 g 6  . loratadine (CLARITIN) 10 MG tablet Take 10 mg by mouth daily as needed for allergies.     . Multiple Vitamin (MULTIVITAMIN WITH MINERALS) TABS tablet Take 1 tablet  daily by mouth.    . naproxen sodium (ALEVE) 220 MG tablet Take 440 mg daily as needed by mouth (pain).    . ondansetron (ZOFRAN) 8 MG tablet TAKE 1 TABLET BY MOUTH TWICE DAILY AS NEEDED --  START  ON  THE  THIRD  DAY  AFTER  CHEMOTHERAPY 90 tablet 1  . prochlorperazine (COMPAZINE) 10 MG tablet TAKE 1 TABLET BY MOUTH EVERY 6 HOURS AS NEEDED FOR NAUSEA AND VOMITING 90 tablet 1   No current facility-administered medications for this visit.     PHYSICAL EXAMINATION: ECOG PERFORMANCE STATUS: 0 - Asymptomatic  Vitals:   10/12/17 0908  BP: 117/86  Pulse: (!) 58  Resp: 18  Temp: 98.2 F (36.8 C)  SpO2: 100%   Filed Weights   10/12/17 0908  Weight: 204 lb 6.4 oz (92.7 kg)    GENERAL:alert, no distress and comfortable SKIN: skin color, texture, turgor are normal, no rashes or significant lesions EYES: normal, Conjunctiva are pink and non-injected, sclera clear OROPHARYNX:no exudate, no erythema and lips, buccal mucosa, and tongue normal  NECK: supple, thyroid normal size, non-tender, without nodularity LYMPH:  no palpable lymphadenopathy in the cervical, axillary or inguinal LUNGS: clear to auscultation and  percussion with normal breathing effort HEART: regular rate & rhythm and no murmurs and no lower extremity edema ABDOMEN:abdomen soft, non-tender and normal bowel sounds Musculoskeletal:no cyanosis of digits and no clubbing  NEURO: alert & oriented x 3 with fluent speech, no focal motor/sensory deficits  LABORATORY DATA:  I have reviewed the data as listed    Component Value Date/Time   NA 142 09/14/2017 0743   NA 142 05/11/2017 0917   K 4.1 09/14/2017 0743   K 4.1 05/11/2017 0917   CL 109 09/14/2017 0743   CO2 26 09/14/2017 0743   CO2 26 05/11/2017 0917   GLUCOSE 80 09/14/2017 0743   GLUCOSE 82 05/11/2017 0917   BUN 18 09/14/2017 0743   BUN 14.2 05/11/2017 0917   CREATININE 0.86 09/14/2017 0743   CREATININE 0.9 05/11/2017 0917   CALCIUM 9.1 09/14/2017 0743   CALCIUM 9.6 05/11/2017 0917   PROT 6.7 09/14/2017 0743   PROT 7.7 05/11/2017 0917   ALBUMIN 4.2 09/14/2017 0743   ALBUMIN 4.2 05/11/2017 0917   AST 38 (H) 09/14/2017 0743   AST 53 (H) 05/11/2017 0917   ALT 49 09/14/2017 0743   ALT 61 (H) 05/11/2017 0917   ALKPHOS 84 09/14/2017 0743   ALKPHOS 114 05/11/2017 0917   BILITOT 0.3 09/14/2017 0743   BILITOT 0.48 05/11/2017 0917   GFRNONAA >60 09/14/2017 0743   GFRAA >60 09/14/2017 0743    No results found for: SPEP, UPEP  Lab Results  Component Value Date   WBC 3.6 (L) 10/12/2017   NEUTROABS 2.4 10/12/2017   HGB 12.6 (L) 10/12/2017   HCT 37.4 (L) 10/12/2017   MCV 89.6 10/12/2017   PLT 196 10/12/2017      Chemistry      Component Value Date/Time   NA 142 09/14/2017 0743   NA 142 05/11/2017 0917   K 4.1 09/14/2017 0743   K 4.1 05/11/2017 0917   CL 109 09/14/2017 0743   CO2 26 09/14/2017 0743   CO2 26 05/11/2017 0917   BUN 18 09/14/2017 0743   BUN 14.2 05/11/2017 0917   CREATININE 0.86 09/14/2017 0743   CREATININE 0.9 05/11/2017 0917      Component Value Date/Time   CALCIUM 9.1 09/14/2017 0743   CALCIUM 9.6 05/11/2017 0917  ALKPHOS 84 09/14/2017 0743    ALKPHOS 114 05/11/2017 0917   AST 38 (H) 09/14/2017 0743   AST 53 (H) 05/11/2017 0917   ALT 49 09/14/2017 0743   ALT 61 (H) 05/11/2017 0917   BILITOT 0.3 09/14/2017 0743   BILITOT 0.48 05/11/2017 0917       All questions were answered. The patient knows to call the clinic with any problems, questions or concerns. No barriers to learning was detected.  I spent 15 minutes counseling the patient face to face. The total time spent in the appointment was 20 minutes and more than 50% was on counseling and review of test results  Heath Lark, MD 10/12/2017 9:10 AM

## 2017-10-12 NOTE — Progress Notes (Signed)
Per Dr Alvy Bimler ok to tx with today's CMP.  Shortly after pt's  Adriamycin push ended and cytoxan infusion began pt c/o "sweats and jittery feeling and trembeling" no other complaints. VSS as charted on flowsheet @1348 . Notified pharmacy who said it was r/t Adriamycin, keep Cytoxan running, perhaps have subsequent pushes go over a longer period.  Called Sandi Mealy PA to infusion room for further assessment.

## 2017-10-12 NOTE — Telephone Encounter (Signed)
Gave patient calendar of upcoming July appointments.

## 2017-10-12 NOTE — Assessment & Plan Note (Signed)
he has mild peripheral neuropathy, likely related to side effects of treatment. It is only mild, not bothering the patient. I will observe for now If it gets worse in the future, I will consider modifying the dose of the treatment  

## 2017-10-12 NOTE — Assessment & Plan Note (Signed)
He has chemotherapy induced delayed nausea and vomiting He will continue taking antiemetics as prescribed

## 2017-10-12 NOTE — Patient Instructions (Signed)
[facility] Discharge Instructions for Patients Receiving Chemotherapy  Today you received the following chemotherapy agents cisplatin/adriamycin/cytoxan   To help prevent nausea and vomiting after your treatment, we encourage you to take your nausea medication as directed.    If you develop nausea and vomiting that is not controlled by your nausea medication, call the clinic.   BELOW ARE SYMPTOMS THAT SHOULD BE REPORTED IMMEDIATELY:  *FEVER GREATER THAN 100.5 F  *CHILLS WITH OR WITHOUT FEVER  NAUSEA AND VOMITING THAT IS NOT CONTROLLED WITH YOUR NAUSEA MEDICATION  *UNUSUAL SHORTNESS OF BREATH  *UNUSUAL BRUISING OR BLEEDING  TENDERNESS IN MOUTH AND THROAT WITH OR WITHOUT PRESENCE OF ULCERS  *URINARY PROBLEMS  *BOWEL PROBLEMS  UNUSUAL RASH Items with * indicate a potential emergency and should be followed up as soon as possible.  Feel free to call the clinic you have any questions or concerns. The clinic phone number is [phone].

## 2017-10-12 NOTE — Assessment & Plan Note (Signed)
This is likely induced by side effects of treatment and that could certainly contribute to nausea I recommend over-the-counter laxatives

## 2017-10-12 NOTE — Assessment & Plan Note (Signed)
Last imaging study showed stable disease Echocardiogram is within normal limits He tolerated treatment with mild expected side effects of treatment I recommend we proceed to complete 6 cycles of chemotherapy He agreed with the plan of care I plan to repeat CT scan of the chest in July I have shared with him data using Levantinib a phase 2 clinical trial for metastatic adenocystic cancer We will discuss that in the future after completion of 6 cycles of therapy whether we will go on surveillance or start him on Levantinib

## 2017-10-14 NOTE — Progress Notes (Signed)
   DATE: 10/12/2017      X CHEMO/IMMUNOTHERAPY/IMMUNOTHERAPY REACTION           MD: Alvy Bimler   AGENT/BLOOD PRODUCT RECEIVING TODAY:   Adriamycin, Cytoxan, and cisplatin  AGENT/BLOOD PRODUCT RECEIVING IMMEDIATELY PRIOR TO REACTION: Cytoxan  VS: BP:     126/82 p:       69 SPO2:       100       T:       98.2  REACTION(S): 2/10 nonradiating left chest pain with shortness of breath, diaphoresis, trembling, nausea, and transient confusion  PREMEDS: Aloxi, Emend, and Pepcid  INTERVENTION:   _X_ EKG: normal EKG, normal sinus rhythm. 65 BPM  Review of Systems  Constitutional: Positive for diaphoresis. Negative for chills.  HENT: Negative for congestion, facial swelling and trouble swallowing.   Respiratory: Positive for shortness of breath. Negative for cough, choking, chest tightness and wheezing.   Cardiovascular: Positive for chest pain. Negative for palpitations.  Gastrointestinal: Positive for nausea. Negative for vomiting.  Musculoskeletal: Negative for back pain.  Skin: Negative for rash.  Neurological: Positive for tremors. Negative for dizziness, speech difficulty and headaches.  Psychiatric/Behavioral: Positive for confusion. The patient is not nervous/anxious.     Physical Exam  Constitutional: No distress.  HENT:  Head: Normocephalic and atraumatic.  Cardiovascular: Normal rate, regular rhythm and normal heart sounds. Exam reveals no gallop and no friction rub.  No murmur heard. Pulmonary/Chest: Effort normal and breath sounds normal. No respiratory distress. He has no wheezes. He has no rales.  Neurological: He is alert.  Skin: Skin is warm and dry. No rash noted. He is not diaphoretic. No erythema.     OUTCOME:  Patient responded to intervention, Chemo/Immunotherapy restarted and completed.  This was discussed with pharmacy who believes that the patient's reaction was related to the rate at which Adriamycin had been given rather than the Cytoxan that he was  receiving.  Their recommendations are to slow the rate of infusion of Adriamycin with his next dose.  No other interventions were undertaken.   Sandi Mealy, MHS, PA-C

## 2017-11-18 ENCOUNTER — Other Ambulatory Visit: Payer: Self-pay | Admitting: Hematology and Oncology

## 2017-11-18 DIAGNOSIS — C080 Malignant neoplasm of submandibular gland: Secondary | ICD-10-CM

## 2017-11-23 ENCOUNTER — Encounter (HOSPITAL_COMMUNITY): Payer: Self-pay

## 2017-11-23 ENCOUNTER — Inpatient Hospital Stay: Payer: 59

## 2017-11-23 ENCOUNTER — Ambulatory Visit (HOSPITAL_COMMUNITY)
Admission: RE | Admit: 2017-11-23 | Discharge: 2017-11-23 | Disposition: A | Discharge status: Home / Self Care | Payer: 59 | Source: Ambulatory Visit | Attending: Hematology and Oncology | Admitting: Hematology and Oncology

## 2017-11-23 ENCOUNTER — Inpatient Hospital Stay: Payer: 59 | Attending: Hematology and Oncology

## 2017-11-23 DIAGNOSIS — C7800 Secondary malignant neoplasm of unspecified lung: Secondary | ICD-10-CM | POA: Diagnosis not present

## 2017-11-23 DIAGNOSIS — C7802 Secondary malignant neoplasm of left lung: Secondary | ICD-10-CM | POA: Diagnosis not present

## 2017-11-23 DIAGNOSIS — C080 Malignant neoplasm of submandibular gland: Secondary | ICD-10-CM | POA: Insufficient documentation

## 2017-11-23 DIAGNOSIS — C7801 Secondary malignant neoplasm of right lung: Secondary | ICD-10-CM | POA: Diagnosis not present

## 2017-11-23 DIAGNOSIS — J018 Other acute sinusitis: Secondary | ICD-10-CM | POA: Diagnosis not present

## 2017-11-23 DIAGNOSIS — Z79899 Other long term (current) drug therapy: Secondary | ICD-10-CM | POA: Insufficient documentation

## 2017-11-23 DIAGNOSIS — G620 Drug-induced polyneuropathy: Secondary | ICD-10-CM | POA: Insufficient documentation

## 2017-11-23 DIAGNOSIS — J359 Chronic disease of tonsils and adenoids, unspecified: Secondary | ICD-10-CM

## 2017-11-23 LAB — COMPREHENSIVE METABOLIC PANEL
ALBUMIN: 4.5 g/dL (ref 3.5–5.0)
ALK PHOS: 72 U/L (ref 38–126)
ALT: 31 U/L (ref 0–44)
AST: 32 U/L (ref 15–41)
Anion gap: 7 (ref 5–15)
BUN: 19 mg/dL (ref 6–20)
CALCIUM: 9.7 mg/dL (ref 8.9–10.3)
CHLORIDE: 106 mmol/L (ref 98–111)
CO2: 26 mmol/L (ref 22–32)
CREATININE: 1.03 mg/dL (ref 0.61–1.24)
GFR calc Af Amer: >60 mL/min (ref >60)
GFR calc non Af Amer: >60 mL/min (ref >60)
GLUCOSE: 89 mg/dL (ref 70–99)
Potassium: 4.1 mmol/L (ref 3.5–5.1)
SODIUM: 139 mmol/L (ref 135–145)
Total Bilirubin: 0.7 mg/dL (ref 0.3–1.2)
Total Protein: 7.3 g/dL (ref 6.5–8.1)

## 2017-11-23 LAB — CBC WITH DIFFERENTIAL/PLATELET
Basophils Absolute: 0 10*3/uL (ref 0.0–0.1)
Basophils Relative: 1 %
EOS ABS: 0.1 10*3/uL (ref 0.0–0.5)
Eosinophils Relative: 2 %
HEMATOCRIT: 37.4 % — AB (ref 38.4–49.9)
Hemoglobin: 12.9 g/dL — ABNORMAL LOW (ref 13.0–17.1)
LYMPHS ABS: 0.7 10*3/uL — AB (ref 0.9–3.3)
Lymphocytes Relative: 13 %
MCH: 30.4 pg (ref 27.2–33.4)
MCHC: 34.3 g/dL (ref 32.0–36.0)
MCV: 88.7 fL (ref 79.3–98.0)
MONO ABS: 0.5 10*3/uL (ref 0.1–0.9)
MONOS PCT: 11 %
Neutro Abs: 3.7 10*3/uL (ref 1.5–6.5)
Neutrophils Relative %: 73 %
Platelets: 146 10*3/uL (ref 140–400)
RBC: 4.22 MIL/uL (ref 4.20–5.82)
RDW: 14.7 % — AB (ref 11.0–14.6)
WBC: 5.1 10*3/uL (ref 4.0–10.3)

## 2017-11-23 LAB — MAGNESIUM: MAGNESIUM: 1.8 mg/dL (ref 1.7–2.4)

## 2017-11-23 MED ORDER — HEPARIN SOD (PORK) LOCK FLUSH 100 UNIT/ML IV SOLN
INTRAVENOUS | Status: AC
  Filled 2017-11-23: qty 5

## 2017-11-23 MED ORDER — IOHEXOL 300 MG/ML  SOLN
75.0000 mL | Freq: Once | INTRAMUSCULAR | Status: AC | PRN
  Administered 2017-11-23: 75 mL via INTRAVENOUS

## 2017-11-23 MED ORDER — SODIUM CHLORIDE 0.9% FLUSH
10.0000 mL | Freq: Once | INTRAVENOUS | Status: AC
  Administered 2017-11-23: 10 mL
  Filled 2017-11-23: qty 10

## 2017-11-23 MED ORDER — HEPARIN SOD (PORK) LOCK FLUSH 100 UNIT/ML IV SOLN
500.0000 [IU] | Freq: Once | INTRAVENOUS | Status: AC
  Administered 2017-11-23: 500 [IU] via INTRAVENOUS

## 2017-11-23 NOTE — Patient Instructions (Signed)
Implanted Port Home Guide An implanted port is a type of central line that is placed under the skin. Central lines are used to provide IV access when treatment or nutrition needs to be given through a person's veins. Implanted ports are used for long-term IV access. An implanted port may be placed because:  You need IV medicine that would be irritating to the small veins in your hands or arms.  You need long-term IV medicines, such as antibiotics.  You need IV nutrition for a long period.  You need frequent blood draws for lab tests.  You need dialysis.  Implanted ports are usually placed in the chest area, but they can also be placed in the upper arm, the abdomen, or the leg. An implanted port has two main parts:  Reservoir. The reservoir is round and will appear as a small, raised area under your skin. The reservoir is the part where a needle is inserted to give medicines or draw blood.  Catheter. The catheter is a thin, flexible tube that extends from the reservoir. The catheter is placed into a large vein. Medicine that is inserted into the reservoir goes into the catheter and then into the vein.  How will I care for my incision site? Do not get the incision site wet. Bathe or shower as directed by your health care provider. How is my port accessed? Special steps must be taken to access the port:  Before the port is accessed, a numbing cream can be placed on the skin. This helps numb the skin over the port site.  Your health care provider uses a sterile technique to access the port. ? Your health care provider must put on a mask and sterile gloves. ? The skin over your port is cleaned carefully with an antiseptic and allowed to dry. ? The port is gently pinched between sterile gloves, and a needle is inserted into the port.  Only "non-coring" port needles should be used to access the port. Once the port is accessed, a blood return should be checked. This helps ensure that the port  is in the vein and is not clogged.  If your port needs to remain accessed for a constant infusion, a clear (transparent) bandage will be placed over the needle site. The bandage and needle will need to be changed every week, or as directed by your health care provider.  Keep the bandage covering the needle clean and dry. Do not get it wet. Follow your health care provider's instructions on how to take a shower or bath while the port is accessed.  If your port does not need to stay accessed, no bandage is needed over the port.  What is flushing? Flushing helps keep the port from getting clogged. Follow your health care provider's instructions on how and when to flush the port. Ports are usually flushed with saline solution or a medicine called heparin. The need for flushing will depend on how the port is used.  If the port is used for intermittent medicines or blood draws, the port will need to be flushed: ? After medicines have been given. ? After blood has been drawn. ? As part of routine maintenance.  If a constant infusion is running, the port may not need to be flushed.  How long will my port stay implanted? The port can stay in for as long as your health care provider thinks it is needed. When it is time for the port to come out, surgery will be   done to remove it. The procedure is similar to the one performed when the port was put in. When should I seek immediate medical care? When you have an implanted port, you should seek immediate medical care if:  You notice a bad smell coming from the incision site.  You have swelling, redness, or drainage at the incision site.  You have more swelling or pain at the port site or the surrounding area.  You have a fever that is not controlled with medicine.  This information is not intended to replace advice given to you by your health care provider. Make sure you discuss any questions you have with your health care provider. Document  Released: 04/26/2005 Document Revised: 10/02/2015 Document Reviewed: 01/01/2013 Elsevier Interactive Patient Education  2017 [employer]  

## 2017-11-24 ENCOUNTER — Inpatient Hospital Stay (HOSPITAL_BASED_OUTPATIENT_CLINIC_OR_DEPARTMENT_OTHER): Payer: 59 | Admitting: Hematology and Oncology

## 2017-11-24 ENCOUNTER — Encounter: Payer: Self-pay | Admitting: Hematology and Oncology

## 2017-11-24 ENCOUNTER — Telehealth: Payer: Self-pay | Admitting: Hematology and Oncology

## 2017-11-24 VITALS — BP 106/63 | HR 64 | Temp 98.3°F | Resp 18 | Ht 72.0 in | Wt 205.2 lb

## 2017-11-24 DIAGNOSIS — C7802 Secondary malignant neoplasm of left lung: Secondary | ICD-10-CM | POA: Diagnosis not present

## 2017-11-24 DIAGNOSIS — G620 Drug-induced polyneuropathy: Secondary | ICD-10-CM

## 2017-11-24 DIAGNOSIS — C080 Malignant neoplasm of submandibular gland: Secondary | ICD-10-CM

## 2017-11-24 DIAGNOSIS — C7800 Secondary malignant neoplasm of unspecified lung: Secondary | ICD-10-CM

## 2017-11-24 DIAGNOSIS — C7801 Secondary malignant neoplasm of right lung: Secondary | ICD-10-CM

## 2017-11-24 DIAGNOSIS — J018 Other acute sinusitis: Secondary | ICD-10-CM

## 2017-11-24 DIAGNOSIS — Z79899 Other long term (current) drug therapy: Secondary | ICD-10-CM

## 2017-11-24 DIAGNOSIS — T451X5A Adverse effect of antineoplastic and immunosuppressive drugs, initial encounter: Secondary | ICD-10-CM

## 2017-11-24 DIAGNOSIS — J329 Chronic sinusitis, unspecified: Secondary | ICD-10-CM | POA: Insufficient documentation

## 2017-11-24 MED ORDER — TRIAMCINOLONE ACETONIDE 55 MCG/ACT NA AERO: 1.0000 | INHALATION_SPRAY | Freq: Two times a day (BID) | NASAL | 12 refills | Status: DC

## 2017-11-24 NOTE — Assessment & Plan Note (Signed)
He is not symptomatic Observed with surveillance imaging in 3 months

## 2017-11-24 NOTE — Assessment & Plan Note (Signed)
He has some sinus congestion I recommend Nasacort as needed

## 2017-11-24 NOTE — Progress Notes (Signed)
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[place]) I have reviewed multiple imaging studies with the patient extensively Overall, he has stable disease He is still recovering from side effects of treatment I recommend discontinuation of treatment and repeat imaging study in 3 months We discussed port removal  Metastasis to lung [facility]) He is not symptomatic Observed with surveillance imaging in 3 months   Peripheral neuropathy due to chemotherapy [facility]) He is recovering from side effects of neuropathy He will continue Cymbalta  Sinusitis nasal He has some sinus congestion I recommend Nasacort as needed   Orders Placed This Encounter  Procedures  . IR REMOVAL TUN ACCESS W/ PORT W/O FL MOD SED    Standing Status:   Future    Standing Expiration Date:   01/26/2019    Order Specific Question:   Reason for exam:    Answer:   chemo completed    Order Specific Question:   Preferred Imaging Location?    Answer:   [facility]    Standing Status:   Future    Standing Expiration Date:   11/25/2018    Order Specific Question:   If indicated for the ordered procedure, I authorize the administration of contrast media per Radiology protocol    Answer:   Yes    Order Specific Question:   Preferred imaging location?    Answer:   [facility]    Order Specific Question:   Radiology Contrast Protocol - do NOT remove file path    Answer:   [email or web address]    INTERVAL HISTORY: Please see below for problem oriented charting. 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 No acute intracranial abnormality. 2. Swollen right submandibular gland containing prominent calcifications consistent with submandibular gland stones. Submandibular gland enlargement is most likely inflammatory. Malignancy cannot be entirely excluded .      07/12/2014 Pathology Results    Submandibular gland, Right - ADENOID CYSTIC CARCINOMA, GRADE I OF III, SEE COMMENT. - POSITIVE FOR PERINEURAL INVASION. - TUMOR INVOLVES SURGICAL EXCISIONAL MARGIN - SEE TUMOR SYNOPTIC TEMPLATE BELOW. Microscopic Comment ONCOLOGY TABLE - SALIVARY GLAND 1. Specimen: Right submandibular gland 2. Procedure: Excision. 3. Tumor site and laterality: Right submandibular gland 4. Tumor focality: Unifocal 5. Maximum tumor size (cm): 3.8 cm 6. Histologic type: Adenoid cystic carcinoma. 7. Grade: I of III 8. Margins: Positive Distance of tumor from closest margin: Present at margin 9. Perineural invasion: Present 10. Lymph-Vascular invasion: Absent. 11. Lymph nodes: # examined - 0; # positive - N/A; extracapsular extension - N/A 12. TNM code: pT2, pNX, pMX      07/12/2014 Surgery    Right submandibular gland excision       07/24/2014 Miscellaneous    The patient was seen by radiation oncologist locally  who offered him adjuvant radiation treatment.  The patient obtained second opinion at [facility] who recommended against adjuvant radiation treatment.  The patient finally decided not proceed with radiation therapy      07/29/2014 Imaging    No findings specific for metastatic disease in the chest or abdomen. 4 mm subpleural nodule in the medial left lower  lobe, likely reflecting a benign subpleural lymph node, although technically indeterminate. Attention on follow-up is suggested.       07/30/2014 Imaging    MR face: Postop resection of right submandibular gland for adenoid cystic carcinoma. No evidence of perineural spread. No skullbase or cavernous sinus involvement.       08/12/2014 Surgery    Right level I, 2 neck selective dissection       08/12/2014 Pathology Results    Lymph nodes, radical neck dissection, limited right neck - BENIGN SALIVARY GLAND TISSUE; NEGATIVE FOR ATYPIA OR MALIGNANCY. - BENIGN SKIN WITH DERMAL AND SUBCUTANEOUS SCAR AND INFLAMMATORY TISSUE REACTION. - TWO INTRA-SALIVARY GLAND LYMPH NODES, NEGATIVE FOR TUMOR (0/2) - ONE LEVEL ONE-A LYMPH NODE, NEGATIVE FOR TUMOR (0/1). - SIX LEVEL TWO LYMPH NODES, NEGATIVE FOR TUMOR (0/6)      04/07/2015 Imaging    MR neck: 1. Status post resection of right submandibular gland. 2. No evidence for perineural spread or tumor recurrence. 3. Stable central and bilateral foraminal narrowing at C6-7.      09/25/2015 Imaging    MR neck: Stable exam. No evidence of residual or recurrent right submandibular carcinoma.      07/08/2016 Procedure    He underwent repeat excision of right submandibular mass: A 2 x 3 cm ellipse was made around mass incorporating the scar. This was carried down through skin and subcutaneous fat and the dissection was completed at the level of the sternomastoid muscle. Examination of the specimen revealed complete removal. This was sent for pathologic interpretation.           07/09/2016 Pathology Results    Adenoid cystic carcinoma (margins free of neoplasm)      08/06/2016 Imaging    CT scan of chest, abdomen and pelvis: 1. There are now multiple pulmonary nodules consistent with metastatic disease to the lungs. One nodule was present previously, measuring 4 mm, noted along the pleural margin of the left lower lobe. This is most likely a benign  granuloma. The remaining visualized nodules are new consistent with metastatic disease, given the patient's history. 2. No other findings of metastatic disease within the chest. 3. No evidence of metastatic disease in the abdomen or pelvis. 4. No acute findings.      08/06/2016 Imaging    Ct neck: 1. Multiple new bilateral apical pulmonary nodules measuring up to 1.3 cm and concerning for metastatic disease. 2. 22 x 7 mm right level III lymph node, enlarged from 2016 and indeterminate. 3. Prior right submandibular gland resection without evidence of locally recurrent mass.      08/06/2016 Imaging    MR face: Prior right submandibular gland resection without evidence of recurrent tumor      10/12/2016 Imaging    There is no new mediastinal or hilar lymphadenopathy.  The heart is normal in size and there is no pericardial or pleural effusion.  Bilateral pulmonary nodules have slightly increased in size, suspicious for metastatic disease. For instance a 14 mm nodule on image 39 previously measured 12 mm. The largest nodule in the right lower lobe measures 24 mm in diameter.  Limited imaging through the upper abdomen shows normal adrenal glands.      10/12/2016 Imaging    CT neck 1.No evidence for primary or nodal recurrence in the neck. 2.Bilateral pulmonary nodules.      02/16/2017 Imaging    Ct chest showed enlarging pulmonary metastases.      02/16/2017 Imaging    1. Unchanged examination of the neck with stable size of right level 3 and left level IIa lymph nodes. 2. Please see dedicated report for concomitant chest CT for findings below thoracic inlet.      03/25/2017 Procedure    Technically successful CT guided core needle core biopsy of indeterminate enlarging left lower lobe pulmonary nodule.      03/25/2017 Pathology Results    Lung, biopsy, Left lower lobe - FINDINGS CONSISTENT WITH METASTATIC ADENOID CYSTIC CARCINOMA, SEE COMMENT. Microscopic Comment There is a  small focus of tumor with similar morphology to the patient's prior adenoid cystic carcinoma of the submandibular gland ([id number], reviewed).       04/18/2017 Imaging    Progressive bilateral pulmonary metastases, as above. Three subcentimeter hypervascular lesions in the right liver, unchanged from March 2018, indeterminate.      05/05/2017 Procedure    Successful placement of a right internal jugular approach power injectable Port-A-Cath. The catheter is ready for immediate use      05/16/2017 -  Chemotherapy    The patient had chemotherapy with cisplatin, cytoxan and doxorubicin      07/27/2017 Imaging    ECHO: LV EF: 55% -  60%      08/04/2017 Imaging    CT neck No locally recurrent tumor status post RIGHT submandibular gland excision for adenoid cystic carcinoma.  Previously identified regional lymph nodes are smaller and non worrisome.  Please see CT chest report for additional important findings.      08/04/2017 Imaging    CT chest 1. Stable pulmonary metastatic disease. No new or progressive findings are identified. 2. No mediastinal or hilar mass or adenopathy. 3. No new/acute pulmonary findings.      11/23/2017 Imaging    Pulmonary metastatic disease is stable.       REVIEW OF SYSTEMS:   Constitutional: Denies fevers, chills or abnormal weight loss Eyes: Denies blurriness of vision Ears, nose, mouth, throat, and face: Denies mucositis or sore throat Cardiovascular: Denies palpitation, chest discomfort or lower extremity swelling Gastrointestinal:  Denies nausea, heartburn or change in bowel habits Skin: Denies abnormal skin rashes Lymphatics: Denies new lymphadenopathy or easy bruising Neurological:Denies numbness, tingling or new weaknesses Behavioral/Psych: Mood is stable, no new changes  All other systems were reviewed with the patient and are negative.  I have reviewed the past medical history, past surgical history, social history and family history  with the patient and they are unchanged from previous note.  ALLERGIES:  is allergic to hydrocodone.  MEDICATIONS:  Current Outpatient Medications  Medication Sig Dispense Refill  . DULoxetine (CYMBALTA) 60 MG capsule Take 1 capsule (60 mg total) by mouth daily. 30 capsule 0  . famotidine (PEPCID AC) 10 MG tablet Take 10 mg daily as needed by mouth for heartburn or indigestion.    Marland Kitchen ibuprofen (ADVIL,MOTRIN) 200 MG tablet Take 200-800 mg 2 (two) times daily as needed by mouth for moderate pain.    Marland Kitchen lidocaine-prilocaine (EMLA) cream Apply 1 application topically as needed. 30 g 6  . loratadine (CLARITIN) 10 MG tablet Take 10 mg by mouth daily as needed for allergies.     Marland Kitchen  Multiple Vitamin (MULTIVITAMIN WITH MINERALS) TABS tablet Take 1 tablet daily by mouth.    . naproxen sodium (ALEVE) 220 MG tablet Take 440 mg daily as needed by mouth (pain).    . ondansetron (ZOFRAN) 8 MG tablet TAKE 1 TABLET BY MOUTH TWICE DAILY AS NEEDED --  START  ON  THE  THIRD  DAY  AFTER  CHEMOTHERAPY 90 tablet 1  . prochlorperazine (COMPAZINE) 10 MG tablet TAKE 1 TABLET BY MOUTH EVERY 6 HOURS AS NEEDED FOR NAUSEA AND VOMITING 90 tablet 1  . triamcinolone (NASACORT) 55 MCG/ACT AERO nasal inhaler Place 1 spray into the nose 2 (two) times daily. 1 Inhaler 12   No current facility-administered medications for this visit.     PHYSICAL EXAMINATION: ECOG PERFORMANCE STATUS: 0 - Asymptomatic  Vitals:   11/24/17 0811  BP: 106/63  Pulse: 64  Resp: 18  Temp: 98.3 F (36.8 C)  SpO2: 100%   Filed Weights   11/24/17 0811  Weight: 205 lb 3.2 oz (93.1 kg)    GENERAL:alert, no distress and comfortable SKIN: skin color, texture, turgor are normal, no rashes or significant lesions EYES: normal, Conjunctiva are pink and non-injected, sclera clear OROPHARYNX:no exudate, no erythema and lips, buccal mucosa, and tongue normal  NECK: supple, thyroid normal size, non-tender, without nodularity LYMPH:  no palpable  lymphadenopathy in the cervical, axillary or inguinal LUNGS: clear to auscultation and percussion with normal breathing effort HEART: regular rate & rhythm and no murmurs and no lower extremity edema ABDOMEN:abdomen soft, non-tender and normal bowel sounds Musculoskeletal:no cyanosis of digits and no clubbing  NEURO: alert & oriented x 3 with fluent speech, no focal motor/sensory deficits  LABORATORY DATA:  I have reviewed the data as listed    Component Value Date/Time   NA 139 11/23/2017 0741   NA 142 05/11/2017 0917   K 4.1 11/23/2017 0741   K 4.1 05/11/2017 0917   CL 106 11/23/2017 0741   CO2 26 11/23/2017 0741   CO2 26 05/11/2017 0917   GLUCOSE 89 11/23/2017 0741   GLUCOSE 82 05/11/2017 0917   BUN 19 11/23/2017 0741   BUN 14.2 05/11/2017 0917   CREATININE 1.03 11/23/2017 0741   CREATININE 0.9 05/11/2017 0917   CALCIUM 9.7 11/23/2017 0741   CALCIUM 9.6 05/11/2017 0917   PROT 7.3 11/23/2017 0741   PROT 7.7 05/11/2017 0917   ALBUMIN 4.5 11/23/2017 0741   ALBUMIN 4.2 05/11/2017 0917   AST 32 11/23/2017 0741   AST 53 (H) 05/11/2017 0917   ALT 31 11/23/2017 0741   ALT 61 (H) 05/11/2017 0917   ALKPHOS 72 11/23/2017 0741   ALKPHOS 114 05/11/2017 0917   BILITOT 0.7 11/23/2017 0741   BILITOT 0.48 05/11/2017 0917   GFRNONAA >60 11/23/2017 0741   GFRAA >60 11/23/2017 0741    No results found for: SPEP, UPEP  Lab Results  Component Value Date   WBC 5.1 11/23/2017   NEUTROABS 3.7 11/23/2017   HGB 12.9 (L) 11/23/2017   HCT 37.4 (L) 11/23/2017   MCV 88.7 11/23/2017   PLT 146 11/23/2017      Chemistry      Component Value Date/Time   NA 139 11/23/2017 0741   NA 142 05/11/2017 0917   K 4.1 11/23/2017 0741   K 4.1 05/11/2017 0917   CL 106 11/23/2017 0741   CO2 26 11/23/2017 0741   CO2 26 05/11/2017 0917   BUN 19 11/23/2017 0741   BUN 14.2 05/11/2017 0917   CREATININE 1.03  11/23/2017 0741   CREATININE 0.9 05/11/2017 0917      Component Value Date/Time   CALCIUM  9.7 11/23/2017 0741   CALCIUM 9.6 05/11/2017 0917   ALKPHOS 72 11/23/2017 0741   ALKPHOS 114 05/11/2017 0917   AST 32 11/23/2017 0741   AST 53 (H) 05/11/2017 0917   ALT 31 11/23/2017 0741   ALT 61 (H) 05/11/2017 0917   BILITOT 0.7 11/23/2017 0741   BILITOT 0.48 05/11/2017 0917       RADIOGRAPHIC STUDIES: I have personally reviewed the radiological images as listed and agreed with the findings in the report. Ct Chest W Contrast  Result Date: 11/23/2017 CLINICAL DATA:  Adenoid cystic cancer of the submandibular gland with lung metastases. Chemotherapy complete. Cough since May. Shortness of breath. EXAM: CT CHEST WITH CONTRAST TECHNIQUE: Multidetector CT imaging of the chest was performed during intravenous contrast administration. CONTRAST:  38m OMNIPAQUE IOHEXOL 300 MG/ML  SOLN COMPARISON:  08/04/2017. FINDINGS: Cardiovascular: Right IJ Port-A-Cath terminates in the right atrium. Vascular structures are unremarkable. Heart size normal. No pericardial effusion. Mediastinum/Nodes: Mediastinal and hilar lymph nodes are not enlarged by CT size criteria. No axillary adenopathy. Esophagus is grossly unremarkable. Small hiatal hernia. Lungs/Pleura: Bilateral pulmonary nodules and masses are again seen within index nodule in the lingula measuring 3.3 cm (series 7, image 105), stable. No pleural fluid. Airway is unremarkable. Upper Abdomen: Visualized portions of the liver, gallbladder, adrenal glands and right kidney are unremarkable. Subcentimeter low-attenuation lesion in the left kidney is too small to characterize. Visualized portions of the spleen, pancreas, stomach and bowel are unremarkable with exception of a small hiatal hernia. No upper abdominal adenopathy. Musculoskeletal: Degenerative changes in the spine. No worrisome lytic or sclerotic lesions. IMPRESSION: Pulmonary metastatic disease is stable. Electronically Signed   By: MLorin PicketM.D.   On: 11/23/2017 13:40    All questions were  answered. The patient knows to call the clinic with any problems, questions or concerns. No barriers to learning was detected.  I spent 15 minutes counseling the patient face to face. The total time spent in the appointment was 20 minutes and more than 50% was on counseling and review of test results  NHeath Lark MD 11/24/2017 12:48 PM

## 2017-11-24 NOTE — Assessment & Plan Note (Signed)
I have reviewed multiple imaging studies with the patient extensively Overall, he has stable disease He is still recovering from side effects of treatment I recommend discontinuation of treatment and repeat imaging study in 3 months We discussed port removal

## 2017-11-24 NOTE — Telephone Encounter (Signed)
Called regarding 10/21

## 2017-11-24 NOTE — Assessment & Plan Note (Signed)
He is recovering from side effects of neuropathy He will continue Cymbalta

## 2017-11-25 ENCOUNTER — Encounter: Payer: Self-pay | Admitting: Hematology and Oncology

## 2017-11-29 ENCOUNTER — Other Ambulatory Visit: Payer: Self-pay | Admitting: Student

## 2017-11-30 ENCOUNTER — Ambulatory Visit (HOSPITAL_COMMUNITY)
Admission: RE | Admit: 2017-11-30 | Discharge: 2017-11-30 | Disposition: A | Discharge status: Home / Self Care | Payer: 59 | Source: Ambulatory Visit | Attending: Hematology and Oncology | Admitting: Hematology and Oncology

## 2017-11-30 ENCOUNTER — Encounter (HOSPITAL_COMMUNITY): Payer: Self-pay

## 2017-11-30 DIAGNOSIS — Z452 Encounter for adjustment and management of vascular access device: Secondary | ICD-10-CM | POA: Insufficient documentation

## 2017-11-30 DIAGNOSIS — Z7951 Long term (current) use of inhaled steroids: Secondary | ICD-10-CM | POA: Diagnosis not present

## 2017-11-30 DIAGNOSIS — Z87891 Personal history of nicotine dependence: Secondary | ICD-10-CM | POA: Insufficient documentation

## 2017-11-30 DIAGNOSIS — Z8589 Personal history of malignant neoplasm of other organs and systems: Secondary | ICD-10-CM | POA: Insufficient documentation

## 2017-11-30 DIAGNOSIS — Z885 Allergy status to narcotic agent status: Secondary | ICD-10-CM | POA: Diagnosis not present

## 2017-11-30 DIAGNOSIS — F329 Major depressive disorder, single episode, unspecified: Secondary | ICD-10-CM | POA: Diagnosis not present

## 2017-11-30 DIAGNOSIS — Z9221 Personal history of antineoplastic chemotherapy: Secondary | ICD-10-CM | POA: Diagnosis not present

## 2017-11-30 DIAGNOSIS — K219 Gastro-esophageal reflux disease without esophagitis: Secondary | ICD-10-CM | POA: Insufficient documentation

## 2017-11-30 DIAGNOSIS — M1990 Unspecified osteoarthritis, unspecified site: Secondary | ICD-10-CM | POA: Diagnosis not present

## 2017-11-30 DIAGNOSIS — C080 Malignant neoplasm of submandibular gland: Secondary | ICD-10-CM

## 2017-11-30 HISTORY — PX: IR REMOVAL TUN ACCESS W/ PORT W/O FL MOD SED: IMG2290

## 2017-11-30 LAB — CBC
HCT: 39.5 % (ref 39.0–52.0)
Hemoglobin: 13.5 g/dL (ref 13.0–17.0)
MCH: 30.3 pg (ref 26.0–34.0)
MCHC: 34.2 g/dL (ref 30.0–36.0)
MCV: 88.6 fL (ref 78.0–100.0)
PLATELETS: 184 10*3/uL (ref 150–400)
RBC: 4.46 MIL/uL (ref 4.22–5.81)
RDW: 13.6 % (ref 11.5–15.5)
WBC: 7.6 10*3/uL (ref 4.0–10.5)

## 2017-11-30 LAB — PROTIME-INR
INR: 0.95
Prothrombin Time: 12.5 seconds (ref 11.4–15.2)

## 2017-11-30 LAB — APTT: aPTT: 28 seconds (ref 24–36)

## 2017-11-30 MED ORDER — NALOXONE HCL 0.4 MG/ML IJ SOLN
INTRAMUSCULAR | Status: AC
  Filled 2017-11-30: qty 1

## 2017-11-30 MED ORDER — FLUMAZENIL 0.5 MG/5ML IV SOLN
INTRAVENOUS | Status: AC
  Filled 2017-11-30: qty 5

## 2017-11-30 MED ORDER — LIDOCAINE HCL 1 % IJ SOLN
INTRAMUSCULAR | Status: AC | PRN
  Administered 2017-11-30: 10 mL

## 2017-11-30 MED ORDER — SODIUM CHLORIDE 0.9 % IV SOLN
INTRAVENOUS | Status: DC
  Administered 2017-11-30: 13:00:00 via INTRAVENOUS

## 2017-11-30 MED ORDER — FENTANYL CITRATE (PF) 100 MCG/2ML IJ SOLN
INTRAMUSCULAR | Status: AC
  Filled 2017-11-30: qty 2

## 2017-11-30 MED ORDER — MIDAZOLAM HCL 2 MG/2ML IJ SOLN
INTRAMUSCULAR | Status: AC
  Filled 2017-11-30: qty 4

## 2017-11-30 MED ORDER — MIDAZOLAM HCL 2 MG/2ML IJ SOLN
INTRAMUSCULAR | Status: AC | PRN
  Administered 2017-11-30 (×2): 1 mg via INTRAVENOUS
  Administered 2017-11-30: 2 mg via INTRAVENOUS

## 2017-11-30 MED ORDER — CEFAZOLIN SODIUM-DEXTROSE 2-4 GM/100ML-% IV SOLN
2.0000 g | INTRAVENOUS | Status: AC
  Administered 2017-11-30: 2 g via INTRAVENOUS

## 2017-11-30 MED ORDER — CEFAZOLIN SODIUM-DEXTROSE 2-4 GM/100ML-% IV SOLN
INTRAVENOUS | Status: AC
  Administered 2017-11-30: 2 g via INTRAVENOUS
  Filled 2017-11-30: qty 100

## 2017-11-30 MED ORDER — LIDOCAINE HCL 1 % IJ SOLN
INTRAMUSCULAR | Status: AC
  Filled 2017-11-30: qty 20

## 2017-11-30 MED ORDER — FENTANYL CITRATE (PF) 100 MCG/2ML IJ SOLN
INTRAMUSCULAR | Status: AC | PRN
  Administered 2017-11-30: 50 ug via INTRAVENOUS
  Administered 2017-11-30: 25 ug via INTRAVENOUS

## 2017-11-30 NOTE — Discharge Instructions (Addendum)
Implanted Port Removal, Care After °Refer to this sheet in the next few weeks. These instructions provide you with information about caring for yourself after your procedure. Your health care provider may also give you more specific instructions. Your treatment has been planned according to current medical practices, but problems sometimes occur. Call your health care provider if you have any problems or questions after your procedure. °What can I expect after the procedure? °After the procedure, it is common to have: °· Soreness or pain near your incision. °· Some swelling or bruising near your incision. ° °Follow these instructions at home: °Medicines °· Take over-the-counter and prescription medicines only as told by your health care provider. °· If you were prescribed an antibiotic medicine, take it as told by your health care provider. Do not stop taking the antibiotic even if you start to feel better. °Bathing °· Do not take baths, swim, or use a hot tub until your health care provider approves. Ask your health care provider if you can take showers. You may only be allowed to take sponge baths for bathing. °Incision care °· Follow instructions from your health care provider about how to take care of your incision. Make sure you: °? Wash your hands with soap and water before you change your bandage (dressing). If soap and water are not available, use hand sanitizer. °? Change your dressing as told by your health care provider. °? Keep your dressing dry. °? Leave stitches (sutures), skin glue, or adhesive strips in place. These skin closures may need to stay in place for 2 weeks or longer. If adhesive strip edges start to loosen and curl up, you may trim the loose edges. Do not remove adhesive strips completely unless your health care provider tells you to do that. °· Check your incision area every day for signs of infection. Check for: °? More redness, swelling, or pain. °? More fluid or  blood. °? Warmth. °? Pus or a bad smell. °Driving °· If you received a sedative, do not drive for 24 hours after the procedure. °· If you did not receive a sedative, ask your health care provider when it is safe to drive. °Activity °· Return to your normal activities as told by your health care provider. Ask your health care provider what activities are safe for you. °· Until your health care provider says it is safe: °? Do not lift anything that is heavier than 10 lb (4.5 kg). °? Do not do activities that involve lifting your arms over your head. °General instructions °· Do not use any tobacco products, such as cigarettes, chewing tobacco, and e-cigarettes. Tobacco can delay healing. If you need help quitting, ask your health care provider. °· Keep all follow-up visits as told by your health care provider. This is important. °Contact a health care provider if: °· You have more redness, swelling, or pain around your incision. °· You have more fluid or blood coming from your incision. °· Your incision feels warm to the touch. °· You have pus or a bad smell coming from your incision. °· You have a fever. °· You have pain that is not relieved by your pain medicine. °Get help right away if: °· You have chest pain. °· You have difficulty breathing. °This information is not intended to replace advice given to you by your health care provider. Make sure you discuss any questions you have with your health care provider. °Document Released: 04/07/2015 Document Revised: 10/02/2015 Document Reviewed: 01/29/2015 °Elsevier Interactive Patient   Education © 2018 [employer] °Moderate Conscious Sedation, Adult, Care After °These instructions provide you with information about caring for yourself after your procedure. Your health care provider may also give you more specific instructions. Your treatment has been planned according to current medical practices, but problems sometimes occur. Call your health care provider if you have  any problems or questions after your procedure. °What can I expect after the procedure? °After your procedure, it is common: °· To feel sleepy for several hours. °· To feel clumsy and have poor balance for several hours. °· To have poor judgment for several hours. °· To vomit if you eat too soon. ° °Follow these instructions at home: °For at least 24 hours after the procedure: ° °· Do not: °? Participate in activities where you could fall or become injured. °? Drive. °? Use heavy machinery. °? Drink alcohol. °? Take sleeping pills or medicines that cause drowsiness. °? Make important decisions or sign legal documents. °? Take care of children on your own. °· Rest. °Eating and drinking °· Follow the diet recommended by your health care provider. °· If you vomit: °? Drink water, juice, or soup when you can drink without vomiting. °? Make sure you have little or no nausea before eating solid foods. °General instructions °· Have a responsible adult stay with you until you are awake and alert. °· Take over-the-counter and prescription medicines only as told by your health care provider. °· If you smoke, do not smoke without supervision. °· Keep all follow-up visits as told by your health care provider. This is important. °Contact a health care provider if: °· You keep feeling nauseous or you keep vomiting. °· You feel light-headed. °· You develop a rash. °· You have a fever. °Get help right away if: °· You have trouble breathing. °This information is not intended to replace advice given to you by your health care provider. Make sure you discuss any questions you have with your health care provider. °Document Released: 02/14/2013 Document Revised: 09/29/2015 Document Reviewed: 08/16/2015 °Elsevier Interactive Patient Education © 2018 [employer] ° °

## 2017-11-30 NOTE — H&P (Signed)
Chief Complaint: Adenoid cystic carcinoma of submandibular gland   Referring Physician(s): [place]  Supervising Physician: Aletta Edouard  Patient Status: [facility] - Out-pt  History of Present Illness: [name] is a 42 y.o. male with Adenoid cystic carcinoma of submandibular gland.  He has completed chemotherapy for now and is now ready for removal of his Port A Cath.  It was initially placed by Dr. Pascal Lux on 05/05/2017.  It has worked well with no issues and no signs of infection.  He is NPO.   Past Medical History:  Diagnosis Date  . Arthritis   . Cancer ([place])   . Carcinoma ([place]) 07/2014   adenoid cystic  . Depression   . GERD (gastroesophageal reflux disease)    otc  . History of kidney stones   . History of stress test    workup done for syncope   . 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 Syncopal episodes   . Tinnitus of both ears     Past Surgical History:  Procedure Laterality Date  . IR FLUORO GUIDE PORT INSERTION RIGHT  05/05/2017  . IR US GUIDE VASC ACCESS RIGHT  05/05/2017  . LUMBAR LAMINECTOMY/DECOMPRESSION MICRODISCECTOMY N/A 11/12/2016   Procedure: MICRODISCECTOMY RIGHT L5-S1;  Surgeon: Jessy Oto, MD;  Location: [place];  Service: Orthopedics;  Laterality: N/A;  . NECK SURGERY    . none    . SUBMANDIBULAR GLAND EXCISION Right 56/38/7564   DR Erik Obey  . SUBMANDIBULAR GLAND EXCISION Right 07/12/2014   Procedure: EXCISION RIGHT SUBMANDIBULAR GLAND;  Surgeon: [name], MD;  Location: [place];  Service: ENT;  Laterality: Right;  . SUBMANDIBULAR GLAND EXCISION Right 08/12/2014   Procedure: Selected neck dissection;  Surgeon: [name], MD;  Location: [place];  Service: ENT;  Laterality: Right;    Allergies: Hydrocodone  Medications: Prior to Admission medications   Medication Sig Start Date End Date Taking? Authorizing Provider  DULoxetine (CYMBALTA) 60 MG capsule Take 1 capsule (60 mg total) by mouth daily. 05/11/17   Dettinger, Fransisca Kaufmann, MD    famotidine (PEPCID AC) 10 MG tablet Take 10 mg daily as needed by mouth for heartburn or indigestion.    [provider]  ibuprofen (ADVIL,MOTRIN) 200 MG tablet Take 200-800 mg 2 (two) times daily as needed by mouth for moderate pain.    [provider]  lidocaine-prilocaine (EMLA) cream Apply 1 application topically as needed. 05/11/17   Heath Lark, MD  loratadine (CLARITIN) 10 MG tablet Take 10 mg by mouth daily as needed for allergies.     [provider]  Multiple Vitamin (MULTIVITAMIN WITH MINERALS) TABS tablet Take 1 tablet daily by mouth.    [provider]  naproxen sodium (ALEVE) 220 MG tablet Take 440 mg daily as needed by mouth (pain).    [provider]  ondansetron (ZOFRAN) 8 MG tablet TAKE 1 TABLET BY MOUTH TWICE DAILY AS NEEDED --  START  ON  THE  THIRD  DAY  AFTER  CHEMOTHERAPY 08/08/17   Heath Lark, MD  prochlorperazine (COMPAZINE) 10 MG tablet TAKE 1 TABLET BY MOUTH EVERY 6 HOURS AS NEEDED FOR NAUSEA AND VOMITING 11/18/17   Heath Lark, MD  triamcinolone (NASACORT) 55 MCG/ACT AERO nasal inhaler Place 1 spray into the nose 2 (two) times daily. 11/24/17   Heath Lark, MD     Family History  Adopted: Yes  Family history unknown: Yes    Social History   Socioeconomic History  . Marital status:  Legally Separated    Spouse name: Not on file  . Number of children: 1  . Years of education: Not on file  . Highest education level: Not on file  Occupational History  . Not on file  Social Needs  . Financial resource strain: Not on file  . Food insecurity:    Worry: Not on file    Inability: Not on file  . Transportation needs:    Medical: Not on file    Non-medical: Not on file  Tobacco Use  . Smoking status: Former Smoker    Packs/day: 0.50    Years: 9.00    Pack years: 4.50    Last attempt to quit: 02/22/2011    Years since quitting: 6.7  . Smokeless tobacco: Former Systems developer    Types: Chew  Substance and Sexual Activity  .  Alcohol use: Yes    Alcohol/week: 0.0 oz    Comment: occas. beer  STOPPED ON 3/4  . Drug use: Yes    Types: Marijuana    Comment: once weekly---STOPPED IT 3/4  . Sexual activity: Not on file  Lifestyle  . Physical activity:    Days per week: Not on file    Minutes per session: Not on file  . Stress: Not on file  Relationships  . Social connections:    Talks on phone: Not on file    Gets together: Not on file    Attends religious service: Not on file    Active member of club or organization: Not on file    Attends meetings of clubs or organizations: Not on file    Relationship status: Not on file  Other Topics Concern  . Not on file  Social History Narrative   Separated from his wife. Lives with his son.  Education: high school and [employer].  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   Lives with Lavella Lemons     Review of Systems: A 12 point ROS discussed and pertinent positives are indicated in the HPI above.  All other systems are negative. Review of Systems  Vital Signs: BP 118/85 (BP Location: Right Arm)   Pulse 62   Temp 98.4 F (36.9 C) (Oral)   Resp 18   SpO2 99%   Physical Exam  Constitutional: He is oriented to person, place, and time. He appears well-developed.  HENT:  Head: Normocephalic and atraumatic.  Eyes: EOM are normal.  Neck: Normal range of motion.  Cardiovascular: Normal rate, regular rhythm and normal heart sounds.  Pulmonary/Chest: Effort normal and breath sounds normal.  Abdominal: Soft.  Musculoskeletal: Normal range of motion.  Neurological: He is alert and oriented to person, place, and time.  Skin: Skin is warm and dry.  Psychiatric: He has a normal mood and affect. His behavior is normal. Judgment and thought content normal.  Vitals reviewed.   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  Chemotherapy complete. Cough since May. Shortness of breath. EXAM: CT CHEST WITH CONTRAST TECHNIQUE: Multidetector CT imaging of the chest was performed during intravenous contrast administration. CONTRAST:  21mL OMNIPAQUE IOHEXOL 300 MG/ML  SOLN COMPARISON:  08/04/2017. FINDINGS: Cardiovascular: Right IJ Port-A-Cath terminates in the right atrium. Vascular structures are unremarkable. Heart size normal. No pericardial effusion. Mediastinum/Nodes: Mediastinal and hilar lymph nodes are not enlarged by CT size criteria. No axillary adenopathy. Esophagus is grossly unremarkable. Small hiatal hernia. Lungs/Pleura: Bilateral  pulmonary nodules and masses are again seen within index nodule in the lingula measuring 3.3 cm (series 7, image 105), stable. No pleural fluid. Airway is unremarkable. Upper Abdomen: Visualized portions of the liver, gallbladder, adrenal glands and right kidney are unremarkable. Subcentimeter low-attenuation lesion in the left kidney is too small to characterize. Visualized portions of the spleen, pancreas, stomach and bowel are unremarkable with exception of a small hiatal hernia. No upper abdominal adenopathy. Musculoskeletal: Degenerative changes in the spine. No worrisome lytic or sclerotic lesions. IMPRESSION: Pulmonary metastatic disease is stable. Electronically Signed   By: Lorin Picket M.D.   On: 11/23/2017 13:40    Labs:  CBC: Recent Labs    08/08/17 0810 09/14/17 0743 10/12/17 0819 11/23/17 0741  WBC 5.5 5.0 3.6* 5.1  HGB 13.3 12.6* 12.6* 12.9*  HCT 39.8 37.7* 37.4* 37.4*  PLT 252 145 196 146    COAGS: Recent Labs    03/25/17 0908 05/05/17 1151  INR 0.91 0.92  APTT 29  --     BMP: Recent Labs    08/08/17 0810 09/14/17 0743 10/12/17 0819 11/23/17 0741  NA 140 142 139 139  K 4.1 4.1 4.3 4.1  CL 107 109 106 106  CO2 25 26 24 26   GLUCOSE 77 80 88 89  BUN 14 18 16 19   CALCIUM 10.0 9.1 9.2 9.7  CREATININE 0.93 0.86 0.91 1.03  GFRNONAA >60 >60 >60 >60   GFRAA >60 >60 >60 >60    LIVER FUNCTION TESTS: Recent Labs    08/08/17 0810 09/14/17 0743 10/12/17 0819 11/23/17 0741  BILITOT 0.5 0.3 0.5 0.7  AST 23 38* 31 32  ALT 20 49 20 31  ALKPHOS 78 84 68 72  PROT 7.2 6.7 7.0 7.3  ALBUMIN 4.4 4.2 4.4 4.5    TUMOR MARKERS: No results for input(s): AFPTM, CEA, CA199, CHROMGRNA in the last 8760 hours.  Assessment and Plan:  Adenoid cystic carcinoma of submandibular gland   Chemotherapy is complete for now. He understands he may have to have another Port placement in the future but at this time, he really wants it removed.  Will proceed with removal of the tunneled catheter with Port today by Dr. Kathlene Cote.  Risks and benefits of image guided port-a-catheter removal was discussed with the patient including, but not limited to bleeding, infection, and need for additional procedures.  All of the patient's questions were answered, patient is agreeable to proceed. Consent signed and in chart.  Thank you for this interesting consult.  I greatly enjoyed meeting MANNIX KROEKER and look forward to participating in their care.  A copy of this report was sent to the requesting provider on this date.  Electronically Signed: Murrell Redden, PA-C   11/30/2017, 12:27 PM      I spent a total of  25 Minutes in face to face in clinical consultation, greater than 50% of which was counseling/coordinating care for [facility] A Cath removal.

## 2017-11-30 NOTE — Procedures (Signed)
Interventional Radiology Procedure Note  Procedure: Port removal  Complications: None  Estimated Blood Loss: < 10 mL  Findings: Right IJ/chest port removed without difficulty.  Wound closed.  Venetia Night. Kathlene Cote, M.D Pager:  [phone]

## 2018-02-10 ENCOUNTER — Ambulatory Visit: Payer: 59 | Admitting: Family Medicine

## 2018-02-10 ENCOUNTER — Encounter: Payer: Self-pay | Admitting: Family Medicine

## 2018-02-10 VITALS — BP 121/83 | HR 64 | Temp 97.9°F | Ht 72.0 in | Wt 200.8 lb

## 2018-02-10 DIAGNOSIS — F339 Major depressive disorder, recurrent, unspecified: Secondary | ICD-10-CM

## 2018-02-10 DIAGNOSIS — Z23 Encounter for immunization: Secondary | ICD-10-CM | POA: Diagnosis not present

## 2018-02-10 MED ORDER — DIAZEPAM 5 MG PO TABS: 5.0000 mg | ORAL_TABLET | Freq: Three times a day (TID) | ORAL | 1 refills | Status: DC | PRN

## 2018-02-10 MED ORDER — CYCLOBENZAPRINE HCL 10 MG PO TABS: 10.0000 mg | ORAL_TABLET | Freq: Three times a day (TID) | ORAL | 1 refills | Status: DC | PRN

## 2018-02-10 MED ORDER — DULOXETINE HCL 60 MG PO CPEP: 60.0000 mg | ORAL_CAPSULE | Freq: Every day | ORAL | 1 refills | Status: DC

## 2018-02-10 NOTE — Progress Notes (Signed)
BP 121/83   Pulse 64   Temp 97.9 F (36.6 C) (Oral)   Ht 6' (1.829 m)   Wt 200 lb 12.8 oz (91.1 kg)   BMI 27.23 kg/m    Subjective:    Patient ID: [name], male    DOB: 1976/04/29, 42 y.o.   MRN: [id number]  HPI: [name] is a 42 y.o. male presenting on 02/10/2018 for Depression (Patient was taking cymbalta but took his self off of it in Jan. Felt good for 3 months then it has went down hill since then.) and mood   HPI Depression Patient has been fighting a head and neck cancer that has been metastasizing and is currently under chemo.  He did have a neck surgery on the right side of his neck that left some numbness on the right side of his face and with all of the things he is doing with with cancer and everything he is just been overwhelmed and his depression has been building up again.  He says he was on the Cymbalta and was doing well but took himself off in January because he was feeling good for 3 months and then its all gone downhill gradually since January of this year.  Patient denies any suicidal ideations but definitely has some thoughts of being better off dead.  He has been struggling with some recurrent chemo treatments that they are trying to use that are experimental at this point because of the spread of his cancer.  Patient has had passive suicidal thoughts in the past 6 months but he denies any currently and denies any current action plan Depression screen [facility] 2/9 02/10/2018 10/06/2016 09/17/2016 06/11/2016 05/11/2016  Decreased Interest 3 3 0 0 0  Down, Depressed, Hopeless 3 3 - 0 0  PHQ - 2 Score 6 6 0 0 0  Altered sleeping 3 3 - - -  Tired, decreased energy 3 3 - - -  Change in appetite 3 3 - - -  Feeling bad or failure about yourself  3 (No Data) - - -  Trouble concentrating 3 - - - -  Moving slowly or fidgety/restless 3 - - - -  Suicidal thoughts 3 - - - -  PHQ-9 Score 27 15 - - -  Difficult doing work/chores Extremely dIfficult - - - -     Relevant  past medical, surgical, family and social history reviewed and updated as indicated. Interim medical history since our last visit reviewed. Allergies and medications reviewed and updated.  Review of Systems  Constitutional: Negative for chills and fever.  Respiratory: Negative for shortness of breath and wheezing.   Cardiovascular: Negative for chest pain and leg swelling.  Musculoskeletal: Negative for back pain and gait problem.  Skin: Negative for rash.  Neurological: Positive for numbness (Right sided facial numbness and neuropathy and burning pain postsurgically from head and neck cancer surgery).  Psychiatric/Behavioral: Positive for decreased concentration, dysphoric mood and suicidal ideas (Ideas but no plan). Negative for self-injury and sleep disturbance. The patient is nervous/anxious.   All other systems reviewed and are negative.   Per HPI unless specifically indicated above   Allergies as of 02/10/2018      Reactions   Hydrocodone Itching      Medication List        Accurate as of 02/10/18  2:24 PM. Always use your most recent med list.          cyclobenzaprine 10 MG tablet Commonly known  as:  FLEXERIL Take 1 tablet (10 mg total) by mouth 3 (three) times daily as needed for muscle spasms.   diazepam 5 MG tablet Commonly known as:  VALIUM Take 1 tablet (5 mg total) by mouth every 8 (eight) hours as needed for anxiety.   DULoxetine 60 MG capsule Commonly known as:  CYMBALTA Take 1 capsule (60 mg total) by mouth daily.   PEPCID AC 10 MG tablet Generic drug:  famotidine Take 10 mg daily as needed by mouth for heartburn or indigestion.          Objective:    BP 121/83   Pulse 64   Temp 97.9 F (36.6 C) (Oral)   Ht 6' (1.829 m)   Wt 200 lb 12.8 oz (91.1 kg)   BMI 27.23 kg/m   Wt Readings from Last 3 Encounters:  02/10/18 200 lb 12.8 oz (91.1 kg)  11/24/17 205 lb 3.2 oz (93.1 kg)  10/12/17 204 lb 6.4 oz (92.7 kg)    Physical Exam  Constitutional:  He is oriented to person, place, and time. He appears well-developed and well-nourished. No distress.  Eyes: Conjunctivae are normal. No scleral icterus.  Neck: Neck supple. No thyromegaly present.  Cardiovascular: Normal rate, regular rhythm, normal heart sounds and intact distal pulses.  No murmur heard. Pulmonary/Chest: Effort normal and breath sounds normal. No respiratory distress. He has no wheezes.  Musculoskeletal: Normal range of motion. He exhibits tenderness (Right sided facial). He exhibits no edema.  Lymphadenopathy:    He has no cervical adenopathy.  Neurological: He is alert and oriented to person, place, and time. Coordination normal.  Skin: Skin is warm and dry. No rash noted. He is not diaphoretic.  Psychiatric: His behavior is normal. His mood appears anxious. He exhibits a depressed mood. He expresses no suicidal ideation. He expresses no suicidal plans.  Nursing note and vitals reviewed.      Assessment & Plan:   Problem List Items Addressed This Visit    None    Visit Diagnoses    Depression, recurrent ([place])    -  Primary   Associated with his cancer and life issues, has been suicidal at times but denies any plan today, will start Cymbalta back up   Relevant Medications   diazepam (VALIUM) 5 MG tablet   DULoxetine (CYMBALTA) 60 MG capsule   cyclobenzaprine (FLEXERIL) 10 MG tablet   Need for immunization against influenza       Relevant Orders   Flu Vaccine QUAD 36+ mos IM (Completed)       Follow up plan: Return in about 2 weeks (around 02/24/2018), or if symptoms worsen or fail to improve, for Recheck depression.  Counseling provided for all of the vaccine components No orders of the defined types were placed in this encounter.   Caryl Pina, MD [facility] 02/10/2018, 2:24 PM

## 2018-02-13 ENCOUNTER — Encounter: Payer: Self-pay | Admitting: Hematology and Oncology

## 2018-02-22 ENCOUNTER — Telehealth: Payer: Self-pay | Admitting: Family Medicine

## 2018-02-22 NOTE — Telephone Encounter (Signed)
Does he still have the Valium, had a taking the Valium when this happened, if he has not taken a Valium and have him go ahead and take 1, if he is already taking 1 and have him take an extra half of Valium to calm him down with this and let me know if it still is an issue.

## 2018-02-22 NOTE — Telephone Encounter (Signed)
Aware of provider's advice. 

## 2018-02-22 NOTE — Telephone Encounter (Signed)
What can we do to help patient?

## 2018-02-23 ENCOUNTER — Ambulatory Visit (HOSPITAL_COMMUNITY)
Admission: RE | Admit: 2018-02-23 | Discharge: 2018-02-23 | Disposition: A | Discharge status: Home / Self Care | Payer: 59 | Source: Ambulatory Visit | Attending: Hematology and Oncology | Admitting: Hematology and Oncology

## 2018-02-23 ENCOUNTER — Inpatient Hospital Stay: Payer: 59 | Attending: Hematology and Oncology

## 2018-02-23 ENCOUNTER — Encounter (HOSPITAL_COMMUNITY): Payer: Self-pay

## 2018-02-23 DIAGNOSIS — G620 Drug-induced polyneuropathy: Secondary | ICD-10-CM | POA: Diagnosis not present

## 2018-02-23 DIAGNOSIS — C080 Malignant neoplasm of submandibular gland: Secondary | ICD-10-CM | POA: Insufficient documentation

## 2018-02-23 DIAGNOSIS — F418 Other specified anxiety disorders: Secondary | ICD-10-CM | POA: Insufficient documentation

## 2018-02-23 DIAGNOSIS — F329 Major depressive disorder, single episode, unspecified: Secondary | ICD-10-CM | POA: Diagnosis not present

## 2018-02-23 DIAGNOSIS — C7800 Secondary malignant neoplasm of unspecified lung: Secondary | ICD-10-CM | POA: Diagnosis not present

## 2018-02-23 LAB — CBC WITH DIFFERENTIAL/PLATELET
ABS IMMATURE GRANULOCYTES: 0 10*3/uL (ref 0.00–0.07)
Basophils Absolute: 0 10*3/uL (ref 0.0–0.1)
Basophils Relative: 1 %
Eosinophils Absolute: 0.2 10*3/uL (ref 0.0–0.5)
Eosinophils Relative: 4 %
HEMATOCRIT: 46.6 % (ref 39.0–52.0)
HEMOGLOBIN: 15.6 g/dL (ref 13.0–17.0)
Immature Granulocytes: 0 %
Lymphocytes Relative: 20 %
Lymphs Abs: 1.2 10*3/uL (ref 0.7–4.0)
MCH: 28.8 pg (ref 26.0–34.0)
MCHC: 33.5 g/dL (ref 30.0–36.0)
MCV: 86.1 fL (ref 80.0–100.0)
Monocytes Absolute: 0.5 10*3/uL (ref 0.1–1.0)
Monocytes Relative: 9 %
NEUTROS ABS: 4 10*3/uL (ref 1.7–7.7)
NEUTROS PCT: 66 %
NRBC: 0 % (ref 0.0–0.2)
Platelets: 204 10*3/uL (ref 150–400)
RBC: 5.41 MIL/uL (ref 4.22–5.81)
RDW: 12.8 % (ref 11.5–15.5)
WBC: 5.9 10*3/uL (ref 4.0–10.5)

## 2018-02-23 LAB — COMPREHENSIVE METABOLIC PANEL
ALK PHOS: 103 U/L (ref 38–126)
ALT: 33 U/L (ref 0–44)
AST: 29 U/L (ref 15–41)
Albumin: 4.4 g/dL (ref 3.5–5.0)
Anion gap: 9 (ref 5–15)
BILIRUBIN TOTAL: 0.5 mg/dL (ref 0.3–1.2)
BUN: 14 mg/dL (ref 6–20)
CALCIUM: 10.1 mg/dL (ref 8.9–10.3)
CO2: 26 mmol/L (ref 22–32)
CREATININE: 0.94 mg/dL (ref 0.61–1.24)
Chloride: 105 mmol/L (ref 98–111)
Glucose, Bld: 85 mg/dL (ref 70–99)
Potassium: 4.5 mmol/L (ref 3.5–5.1)
SODIUM: 140 mmol/L (ref 135–145)
Total Protein: 8 g/dL (ref 6.5–8.1)

## 2018-02-23 MED ORDER — IOHEXOL 300 MG/ML  SOLN
75.0000 mL | Freq: Once | INTRAMUSCULAR | Status: AC | PRN
  Administered 2018-02-23: 75 mL via INTRAVENOUS

## 2018-02-23 MED ORDER — SODIUM CHLORIDE 0.9 % IJ SOLN
INTRAMUSCULAR | Status: AC
  Filled 2018-02-23: qty 50

## 2018-02-24 ENCOUNTER — Ambulatory Visit (INDEPENDENT_AMBULATORY_CARE_PROVIDER_SITE_OTHER): Payer: 59 | Admitting: Family Medicine

## 2018-02-24 ENCOUNTER — Encounter: Payer: Self-pay | Admitting: Family Medicine

## 2018-02-24 VITALS — BP 118/79 | HR 68 | Temp 98.4°F | Ht 72.0 in | Wt 202.0 lb

## 2018-02-24 DIAGNOSIS — F339 Major depressive disorder, recurrent, unspecified: Secondary | ICD-10-CM

## 2018-02-24 MED ORDER — DULOXETINE HCL 30 MG PO CPEP
90.0000 mg | ORAL_CAPSULE | Freq: Every day | ORAL | 1 refills | Status: DC
Start: 2018-02-24 — End: 2018-03-10

## 2018-02-24 NOTE — Progress Notes (Signed)
BP 118/79   Pulse 68   Temp 98.4 F (36.9 C) (Oral)   Ht 6' (1.829 m)   Wt 202 lb (91.6 kg)   BMI 27.40 kg/m    Subjective:    Patient ID: [name], male    DOB: 1975-11-29, 42 y.o.   MRN: [id number]  HPI: [name] is a 42 y.o. male presenting on 02/24/2018 for Depression   HPI Depression recheck Patient is coming in for depression recheck today.  He says he is doing a lot better, he did have one panic attack this associated with anger last week but he was able to calm down with the Valium, he is continued to use the Valium just as needed and is realistically feeling a lot better with it but does want to go up on the dose.  He denies any suicidal ideations.  He just had a lot of cancer testing on Thursday which is the day he had panic attacks and he will get that testing results back this coming Monday in 4 days.  He will keep the Valium handy towards Monday when he goes to talk about the results with his oncologist. Depression screen [facility] 2/9 02/24/2018 02/10/2018 10/06/2016 09/17/2016 06/11/2016  Decreased Interest 3 3 3  0 0  Down, Depressed, Hopeless 3 3 3  - 0  PHQ - 2 Score 6 6 6  0 0  Altered sleeping 3 3 3  - -  Tired, decreased energy 3 3 3  - -  Change in appetite 0 3 3 - -  Feeling bad or failure about yourself  3 3 (No Data) - -  Trouble concentrating 3 3 - - -  Moving slowly or fidgety/restless 0 3 - - -  Suicidal thoughts - 3 - - -  PHQ-9 Score 18 27 15  - -  Difficult doing work/chores - Extremely dIfficult - - -     Relevant past medical, surgical, family and social history reviewed and updated as indicated. Interim medical history since our last visit reviewed. Allergies and medications reviewed and updated.  Review of Systems  Constitutional: Negative for chills and fever.  Respiratory: Negative for shortness of breath and wheezing.   Cardiovascular: Negative for chest pain and leg swelling.  Skin: Negative for rash.  Psychiatric/Behavioral: Positive for  decreased concentration and dysphoric mood. Negative for self-injury, sleep disturbance and suicidal ideas. The patient is nervous/anxious.   All other systems reviewed and are negative.   Per HPI unless specifically indicated above   Allergies as of 02/24/2018      Reactions   Hydrocodone Itching      Medication List        Accurate as of 02/24/18  1:28 PM. Always use your most recent med list.          cyclobenzaprine 10 MG tablet Commonly known as:  FLEXERIL Take 1 tablet (10 mg total) by mouth 3 (three) times daily as needed for muscle spasms.   diazepam 5 MG tablet Commonly known as:  VALIUM Take 1 tablet (5 mg total) by mouth every 8 (eight) hours as needed for anxiety.   DULoxetine 30 MG capsule Commonly known as:  CYMBALTA Take 3 capsules (90 mg total) by mouth daily.   PEPCID AC 10 MG tablet Generic drug:  famotidine Take 10 mg daily as needed by mouth for heartburn or indigestion.          Objective:    BP 118/79   Pulse 68   Temp 98.4  F (36.9 C) (Oral)   Ht 6' (1.829 m)   Wt 202 lb (91.6 kg)   BMI 27.40 kg/m   Wt Readings from Last 3 Encounters:  02/24/18 202 lb (91.6 kg)  02/10/18 200 lb 12.8 oz (91.1 kg)  11/24/17 205 lb 3.2 oz (93.1 kg)    Physical Exam  Constitutional: He is oriented to person, place, and time. He appears well-developed and well-nourished. No distress.  Eyes: Conjunctivae are normal. No scleral icterus.  Neck: Neck supple. No thyromegaly present.  Cardiovascular: Normal rate, regular rhythm, normal heart sounds and intact distal pulses.  No murmur heard. Pulmonary/Chest: Effort normal and breath sounds normal. No respiratory distress. He has no wheezes.  Lymphadenopathy:    He has no cervical adenopathy.  Neurological: He is alert and oriented to person, place, and time. Coordination normal.  Skin: Skin is warm and dry. No rash noted. He is not diaphoretic.  Psychiatric: He has a normal mood and affect. His behavior is  normal.  Nursing note and vitals reviewed.       Assessment & Plan:   Problem List Items Addressed This Visit    None    Visit Diagnoses    Depression, recurrent ([place])    -  Primary   Relevant Medications   DULoxetine (CYMBALTA) 30 MG capsule   Depression, recurrent ([place])       Associated with his cancer and life issues, has been suicidal at times but denies any plan today, will start Cymbalta back up   Relevant Medications   DULoxetine (CYMBALTA) 30 MG capsule       Follow up plan: Return in about 3 weeks (around 03/17/2018), or if symptoms worsen or fail to improve, for Depression recheck.  Counseling provided for all of the vaccine components No orders of the defined types were placed in this encounter.   Caryl Pina, MD [facility] 02/24/2018, 1:28 PM

## 2018-02-27 ENCOUNTER — Inpatient Hospital Stay (HOSPITAL_BASED_OUTPATIENT_CLINIC_OR_DEPARTMENT_OTHER): Payer: 59 | Admitting: Hematology and Oncology

## 2018-02-27 ENCOUNTER — Other Ambulatory Visit: Payer: Self-pay | Admitting: Hematology and Oncology

## 2018-02-27 DIAGNOSIS — G620 Drug-induced polyneuropathy: Secondary | ICD-10-CM | POA: Diagnosis not present

## 2018-02-27 DIAGNOSIS — C7800 Secondary malignant neoplasm of unspecified lung: Secondary | ICD-10-CM

## 2018-02-27 DIAGNOSIS — F0631 Mood disorder due to known physiological condition with depressive features: Secondary | ICD-10-CM | POA: Diagnosis not present

## 2018-02-27 DIAGNOSIS — C080 Malignant neoplasm of submandibular gland: Secondary | ICD-10-CM | POA: Diagnosis not present

## 2018-02-27 DIAGNOSIS — T451X5A Adverse effect of antineoplastic and immunosuppressive drugs, initial encounter: Secondary | ICD-10-CM

## 2018-02-27 DIAGNOSIS — Z7189 Other specified counseling: Secondary | ICD-10-CM

## 2018-02-27 MED ORDER — LENVATINIB (24 MG DAILY DOSE) 2 X 10 MG & 4 MG PO CPPK: 24.0000 mg | ORAL_CAPSULE | Freq: Every day | ORAL | 11 refills | Status: DC

## 2018-02-28 ENCOUNTER — Encounter: Payer: Self-pay | Admitting: Hematology and Oncology

## 2018-02-28 DIAGNOSIS — F0631 Mood disorder due to known physiological condition with depressive features: Secondary | ICD-10-CM | POA: Insufficient documentation

## 2018-02-28 NOTE — Assessment & Plan Note (Signed)
He understood the goals of care is not curative in nature I recommend minimum 3 months of therapy before repeat imaging study.

## 2018-02-28 NOTE — Assessment & Plan Note (Signed)
This is stable.  Cymbalta would help with his neuropathy as well.

## 2018-02-28 NOTE — Assessment & Plan Note (Addendum)
I have review multiple imaging studies with the patient and his wife Currently, he is not symptomatic I shared with him recent publications in regards to the use of lenvatinib for adenocystic carcinoma  We discussed recent publication published in JCO  DOI: 10.1200/JCO.18.01859 Journal of Clinical Oncology 37, no. 18 (October 27, 2017) 7530-0511.  Published online August 09, 2017  Phase II Study of Lenvatinib in Patients With Progressive, Recurrent or Metastatic Adenoid Cystic Carcinoma    Venetia Night, MD, MEd1; Donnamarie Poag. Ebony Hail, MD1,2; Bryson Dames, MD1,2; Francis Dowse; Ramond Dial, MD, MPH3; Ulyess Mort, et. Al  PURPOSE Recurrent or metastatic adenoid cystic carcinoma (R/M ACC) is a malignant neoplasm of predominantly salivary gland origin for which effective therapies are lacking. We conducted a phase II trial evaluating the multitargeted tyrosine kinase inhibitor lenvatinib in patients with R/M ACC.  PATIENTS AND METHODS This study was conducted with a two-stage minimax design. Patients with histologically confirmed R/M ACC of any primary site with radiographic and/or symptomatic progression were eligible. Any prior therapy was allowed except previous lenvatinib. Patients received lenvatinib 24 mg orally per day. The primary end point was overall response rate. Secondary end points were progression-free survival and safety. An exploratory analysis of how MYB expression and genomic alterations relate to outcomes was conducted.  RESULTS Thirty-three patients were enrolled; 32 were evaluable for the primary end point. Five patients (15.6%) had a confirmed partial response, 24 patients (75%) had stable disease, two patients (6.3%) discontinued treatment as a result of toxicity before the first scan, and one patient (3.1%) had progression of disease as best response. Median progression-free survival time was 17.5 months (95% CI, 7.2 months to not reached), although only eight progression events  were observed. Patients otherwise were removed for toxicity (n = 5), as a result of withdrawal of consent (n = 9), or at the treating physician's discretion (n = 6). Twenty-three patients required at least one dose modification, and 18 of 32 patients discontinued lenvatinib for drug-related issues. The most common grade 3 or 4 adverse events were hypertension (n = 9; 28.1%) and oral pain (n = 3; 9.4%). Three grade 4 adverse events were observed (myocardial infarction, n = 1; posterior reversible encephalopathy syndrome, n = 1; and intracranial hemorrhage, n = 1).  CONCLUSION This trial met the prespecified overall response rate primary end point, demonstrating antitumor activity with lenvatinib in R/M ACC patients. Toxicity was comparable to previous studies, requiring monitoring and management.  The risks, benefits, side effects of lenvatinib were discussed with the patient and he agreed to proceed I will get insurance prior authorization to get his medications approved He is aware, he will need to return for frequent blood count monitoring within the first month of treatment

## 2018-02-28 NOTE — Assessment & Plan Note (Signed)
He had recent antianxiety/depression He is doing better with Cymbalta I would defer to his primary care doctor for further management

## 2018-02-28 NOTE — Progress Notes (Signed)
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 18 (October 27, 2017) 1610-9604.  Published online August 09, 2017  Phase II Study of Lenvatinib in Patients With Progressive, Recurrent or Metastatic Adenoid Cystic Carcinoma    Venetia Night, MD, MEd1; Donnamarie Poag. Ebony Hail, MD1,2; Bryson Dames, MD1,2; Francis Dowse; Ramond Dial, MD, MPH3; Ulyess Mort, et. Al  PURPOSE Recurrent or metastatic adenoid cystic carcinoma (R/M ACC) is a malignant neoplasm of predominantly salivary gland origin for which effective therapies are lacking. We conducted a phase II trial evaluating the multitargeted tyrosine kinase inhibitor lenvatinib in patients with R/M ACC.  PATIENTS AND METHODS This study was conducted with a two-stage minimax design. Patients with histologically confirmed R/M ACC of any primary site with radiographic and/or symptomatic progression were eligible. Any prior therapy was allowed except previous lenvatinib. Patients received lenvatinib 24 mg orally per day. The primary end point was overall response rate. Secondary end points were progression-free survival and safety. An exploratory analysis of how MYB expression and genomic alterations relate to outcomes was conducted.  RESULTS Thirty-three patients were enrolled; 32 were evaluable for the primary end point. Five patients (15.6%) had a confirmed partial response, 24 patients (75%) had stable disease, two patients (6.3%) discontinued treatment as a result of toxicity before the  first scan, and one patient (3.1%) had progression of disease as best response. Median progression-free survival time was 17.5 months (95% CI, 7.2 months to not reached), although only eight progression events were observed. Patients otherwise were removed for toxicity (n = 5), as a result of withdrawal of consent (n = 9), or at the treating physician's discretion (n = 6). Twenty-three patients required at least one dose modification, and 18 of 32 patients discontinued lenvatinib for drug-related issues. The most common grade 3 or 4 adverse events were hypertension (n = 9; 28.1%) and oral pain (n = 3; 9.4%). Three grade 4 adverse events were observed (myocardial infarction, n = 1; posterior reversible encephalopathy syndrome, n = 1; and intracranial hemorrhage, n = 1).  CONCLUSION This trial met the prespecified overall response rate primary end point, demonstrating antitumor activity with lenvatinib in R/M ACC patients. Toxicity was comparable to previous studies, requiring monitoring and management.  The risks, benefits, side effects of lenvatinib were discussed with the patient and he agreed to proceed I will get insurance prior authorization to get his medications approved He is aware, he will need to return for frequent blood count monitoring within the first month of treatment   Metastasis to lung [facility]) He is not symptomatic   Depression due to physical illness He had recent antianxiety/depression He is doing better with Cymbalta I would defer to his primary care doctor for further management  Peripheral neuropathy due to chemotherapy [facility]) This is stable.  Cymbalta would help with his neuropathy as well.  Goals of care, counseling/discussion He understood the goals of care is not curative in nature I recommend minimum 3 months of therapy before repeat imaging study.   No orders of the defined types were placed in this encounter.  INTERVAL HISTORY: Please see below for problem  oriented charting. He returns with his wife to review test results. He denies recent cough, chest pain or shortness of breath The patient undergo episodes of depression and anxiety related to his medical condition His primary care doctor has prescribed Cymbalta which has helped his symptoms immensely He denies worsening peripheral neuropathy No neck swelling.  SUMMARY OF ONCOLOGIC HISTORY: Oncology History   C kit patchy positivity, Her 2 neg, AR neg, EGFR neg, [employer] One testing no actionable mutation, PDL-1 neg     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[place])   02/19/2014 Imaging    Ct neck: 1. No acute intracranial abnormality. 2. Swollen right submandibular gland containing prominent calcifications consistent with submandibular gland stones. Submandibular gland enlargement is most likely inflammatory. Malignancy cannot be entirely excluded .    07/12/2014 Pathology Results    Submandibular gland, Right - ADENOID CYSTIC CARCINOMA, GRADE I OF III, SEE COMMENT. - POSITIVE FOR PERINEURAL INVASION. - TUMOR INVOLVES SURGICAL EXCISIONAL MARGIN - SEE TUMOR SYNOPTIC TEMPLATE BELOW. Microscopic Comment ONCOLOGY TABLE - SALIVARY GLAND 1. Specimen: Right submandibular gland 2. Procedure: Excision. 3. Tumor site and laterality: Right submandibular gland 4. Tumor focality: Unifocal 5. Maximum tumor size (cm): 3.8 cm 6. Histologic type: Adenoid cystic carcinoma. 7. Grade: I of III 8. Margins: Positive Distance of tumor from closest margin: Present at margin 9. Perineural invasion: Present 10. Lymph-Vascular invasion: Absent. 11. Lymph nodes: # examined - 0; # positive - N/A; extracapsular extension - N/A 12. TNM code: pT2, pNX, pMX    07/12/2014 Surgery    Right submandibular gland excision     07/24/2014 Miscellaneous    The patient was seen by radiation oncologist locally who offered him adjuvant radiation treatment.  The patient obtained second opinion at [facility] who recommended against adjuvant radiation treatment.  The patient finally decided not proceed with radiation therapy    07/29/2014 Imaging    No findings specific for metastatic disease in the chest or abdomen. 4 mm subpleural nodule in the medial left lower lobe, likely reflecting a benign subpleural lymph node, although technically indeterminate. Attention on follow-up is suggested.     07/30/2014 Imaging    MR face: Postop resection of right submandibular gland for adenoid cystic carcinoma. No evidence of perineural spread. No skullbase or cavernous sinus involvement.     08/12/2014 Surgery    Right level I, 2 neck selective dissection     08/12/2014 Pathology Results    Lymph nodes, radical neck dissection, limited right neck - BENIGN SALIVARY GLAND TISSUE; NEGATIVE FOR ATYPIA OR MALIGNANCY. - BENIGN SKIN WITH DERMAL AND SUBCUTANEOUS SCAR AND INFLAMMATORY TISSUE REACTION. - TWO INTRA-SALIVARY GLAND LYMPH NODES, NEGATIVE FOR TUMOR (0/2) - ONE LEVEL ONE-A LYMPH NODE, NEGATIVE FOR TUMOR (0/1). - SIX LEVEL TWO LYMPH NODES, NEGATIVE FOR TUMOR (0/6)    04/07/2015 Imaging    MR neck: 1. Status post resection of right submandibular gland. 2. No evidence for perineural spread or tumor recurrence. 3. Stable central and bilateral foraminal narrowing at C6-7.    09/25/2015 Imaging    MR neck: Stable exam. No evidence of residual or recurrent right submandibular carcinoma.    07/08/2016 Procedure    He underwent repeat excision of right submandibular mass: A 2 x 3 cm ellipse was made around mass incorporating the scar. This was carried down through skin and subcutaneous fat and the dissection was completed at the level of the sternomastoid muscle.  Examination of the specimen revealed complete removal. This was sent for pathologic interpretation.         07/09/2016 Pathology Results    Adenoid cystic carcinoma (margins free of neoplasm)    08/06/2016 Imaging    CT scan of  chest, abdomen and pelvis: 1. There are now multiple pulmonary nodules consistent with metastatic disease to the lungs. One nodule was present previously, measuring 4 mm, noted along the pleural margin of the left lower lobe. This is most likely a benign granuloma. The remaining visualized nodules are new consistent with metastatic disease, given the patient's history. 2. No other findings of metastatic disease within the chest. 3. No evidence of metastatic disease in the abdomen or pelvis. 4. No acute findings.    08/06/2016 Imaging    Ct neck: 1. Multiple new bilateral apical pulmonary nodules measuring up to 1.3 cm and concerning for metastatic disease. 2. 22 x 7 mm right level III lymph node, enlarged from 2016 and indeterminate. 3. Prior right submandibular gland resection without evidence of locally recurrent mass.    08/06/2016 Imaging    MR face: Prior right submandibular gland resection without evidence of recurrent tumor    10/12/2016 Imaging    There is no new mediastinal or hilar lymphadenopathy.  The heart is normal in size and there is no pericardial or pleural effusion.  Bilateral pulmonary nodules have slightly increased in size, suspicious for metastatic disease. For instance a 14 mm nodule on image 39 previously measured 12 mm. The largest nodule in the right lower lobe measures 24 mm in diameter.  Limited imaging through the upper abdomen shows normal adrenal glands.    10/12/2016 Imaging    CT neck 1.No evidence for primary or nodal recurrence in the neck. 2.Bilateral pulmonary nodules.    02/16/2017 Imaging    Ct chest showed enlarging pulmonary metastases.    02/16/2017 Imaging    1. Unchanged examination of the neck with stable size of right level 3 and left level IIa lymph nodes. 2. Please see dedicated report for concomitant chest CT for findings below thoracic inlet.    03/25/2017 Procedure    Technically successful CT guided core needle core biopsy of  indeterminate enlarging left lower lobe pulmonary nodule.    03/25/2017 Pathology Results    Lung, biopsy, Left lower lobe - FINDINGS CONSISTENT WITH METASTATIC ADENOID CYSTIC CARCINOMA, SEE COMMENT. Microscopic Comment There is a small focus of tumor with similar morphology to the patient's prior adenoid cystic carcinoma of the submandibular gland ([id number], reviewed).     04/18/2017 Imaging    Progressive bilateral pulmonary metastases, as above. Three subcentimeter hypervascular lesions in the right liver, unchanged from March 2018, indeterminate.    05/05/2017 Procedure    Successful placement of a right internal jugular approach power injectable Port-A-Cath. The catheter is ready for immediate use    05/16/2017 -  Chemotherapy    The patient had chemotherapy with cisplatin, cytoxan and doxorubicin    07/27/2017 Imaging    ECHO: LV EF: 55% -  60%    08/04/2017 Imaging    CT neck No locally recurrent tumor status post RIGHT submandibular gland excision for adenoid cystic carcinoma.  Previously identified regional lymph nodes are smaller and non worrisome.  Please see CT chest report for additional important findings.    08/04/2017 Imaging    CT chest 1. Stable pulmonary metastatic disease. No new or progressive findings are identified. 2. No mediastinal or hilar mass or adenopathy. 3. No  new/acute pulmonary findings.    11/23/2017 Imaging    Pulmonary metastatic disease is stable.    11/30/2017 Procedure    Removal of implanted Port-A-Cath utilizing sharp and blunt dissection. The procedure was uncomplicated.    02/24/2018 Imaging    No significant interval change in the appearance of bilateral pulmonary metastatic disease. Most nodules are stable or minimally increased in size in the interval.     REVIEW OF SYSTEMS:   Constitutional: Denies fevers, chills or abnormal weight loss Eyes: Denies blurriness of vision Ears, nose, mouth, throat, and face: Denies mucositis or  sore throat Respiratory: Denies cough, dyspnea or wheezes Cardiovascular: Denies palpitation, chest discomfort or lower extremity swelling Gastrointestinal:  Denies nausea, heartburn or change in bowel habits Skin: Denies abnormal skin rashes Lymphatics: Denies new lymphadenopathy or easy bruising Neurological:Denies numbness, tingling or new weaknesses All other systems were reviewed with the patient and are negative.  I have reviewed the past medical history, past surgical history, social history and family history with the patient and they are unchanged from previous note.  ALLERGIES:  is allergic to hydrocodone.  MEDICATIONS:  Current Outpatient Medications  Medication Sig Dispense Refill  . cyclobenzaprine (FLEXERIL) 10 MG tablet Take 1 tablet (10 mg total) by mouth 3 (three) times daily as needed for muscle spasms. 90 tablet 1  . diazepam (VALIUM) 5 MG tablet Take 1 tablet (5 mg total) by mouth every 8 (eight) hours as needed for anxiety. 90 tablet 1  . DULoxetine (CYMBALTA) 30 MG capsule Take 3 capsules (90 mg total) by mouth daily. 90 capsule 1  . famotidine (PEPCID AC) 10 MG tablet Take 10 mg daily as needed by mouth for heartburn or indigestion.    Marland Kitchen lenvatinib 24 mg daily dose (LENVIMA) 2 x 10 MG & 4 MG capsule Take 24 mg by mouth daily. 90 capsule 11   No current facility-administered medications for this visit.     PHYSICAL EXAMINATION: ECOG PERFORMANCE STATUS: 1 - Symptomatic but completely ambulatory  Vitals:   02/27/18 1215  BP: (!) 113/17  Pulse: 71  Resp: 18  Temp: 98.2 F (36.8 C)  SpO2: 98%   Filed Weights   02/27/18 1215  Weight: 202 lb 9.6 oz (91.9 kg)    GENERAL:alert, no distress and comfortable SKIN: skin color, texture, turgor are normal, no rashes or significant lesions EYES: normal, Conjunctiva are pink and non-injected, sclera clear OROPHARYNX:no exudate, no erythema and lips, buccal mucosa, and tongue normal  NECK: supple, thyroid normal  size, non-tender, without nodularity LYMPH:  no palpable lymphadenopathy in the cervical, axillary or inguinal LUNGS: clear to auscultation and percussion with normal breathing effort HEART: regular rate & rhythm and no murmurs and no lower extremity edema ABDOMEN:abdomen soft, non-tender and normal bowel sounds Musculoskeletal:no cyanosis of digits and no clubbing  NEURO: alert & oriented x 3 with fluent speech, no focal motor/sensory deficits  LABORATORY DATA:  I have reviewed the data as listed    Component Value Date/Time   NA 140 02/23/2018 1054   NA 142 05/11/2017 0917   K 4.5 02/23/2018 1054   K 4.1 05/11/2017 0917   CL 105 02/23/2018 1054   CO2 26 02/23/2018 1054   CO2 26 05/11/2017 0917   GLUCOSE 85 02/23/2018 1054   GLUCOSE 82 05/11/2017 0917   BUN 14 02/23/2018 1054   BUN 14.2 05/11/2017 0917   CREATININE 0.94 02/23/2018 1054   CREATININE 0.9 05/11/2017 0917   CALCIUM 10.1 02/23/2018 1054  CALCIUM 9.6 05/11/2017 0917   PROT 8.0 02/23/2018 1054   PROT 7.7 05/11/2017 0917   ALBUMIN 4.4 02/23/2018 1054   ALBUMIN 4.2 05/11/2017 0917   AST 29 02/23/2018 1054   AST 53 (H) 05/11/2017 0917   ALT 33 02/23/2018 1054   ALT 61 (H) 05/11/2017 0917   ALKPHOS 103 02/23/2018 1054   ALKPHOS 114 05/11/2017 0917   BILITOT 0.5 02/23/2018 1054   BILITOT 0.48 05/11/2017 0917   GFRNONAA >60 02/23/2018 1054   GFRAA >60 02/23/2018 1054    No results found for: SPEP, UPEP  Lab Results  Component Value Date   WBC 5.9 02/23/2018   NEUTROABS 4.0 02/23/2018   HGB 15.6 02/23/2018   HCT 46.6 02/23/2018   MCV 86.1 02/23/2018   PLT 204 02/23/2018      Chemistry      Component Value Date/Time   NA 140 02/23/2018 1054   NA 142 05/11/2017 0917   K 4.5 02/23/2018 1054   K 4.1 05/11/2017 0917   CL 105 02/23/2018 1054   CO2 26 02/23/2018 1054   CO2 26 05/11/2017 0917   BUN 14 02/23/2018 1054   BUN 14.2 05/11/2017 0917   CREATININE 0.94 02/23/2018 1054   CREATININE 0.9  05/11/2017 0917      Component Value Date/Time   CALCIUM 10.1 02/23/2018 1054   CALCIUM 9.6 05/11/2017 0917   ALKPHOS 103 02/23/2018 1054   ALKPHOS 114 05/11/2017 0917   AST 29 02/23/2018 1054   AST 53 (H) 05/11/2017 0917   ALT 33 02/23/2018 1054   ALT 61 (H) 05/11/2017 0917   BILITOT 0.5 02/23/2018 1054   BILITOT 0.48 05/11/2017 0917       RADIOGRAPHIC STUDIES: I have reviewed multiple imaging studies with the patient and his wife I have personally reviewed the radiological images as listed and agreed with the findings in the report. Ct Chest W Contrast  Result Date: 02/24/2018 CLINICAL DATA:  Adenoid cystic carcinoma of the submandibular glands with metastatic disease to the lungs. Status post chemotherapy assess response to therapy. EXAM: CT CHEST WITH CONTRAST TECHNIQUE: Multidetector CT imaging of the chest was performed during intravenous contrast administration. CONTRAST:  46m OMNIPAQUE IOHEXOL 300 MG/ML  SOLN COMPARISON:  11/23/2017 FINDINGS: Cardiovascular: The heart size appears within normal limits. No pericardial effusion identified. Mediastinum/Nodes: Normal appearance of the thyroid gland. The trachea appears patent and is midline. Normal appearance of the esophagus. No mediastinal, supraclavicular, or axillary adenopathy. No hilar adenopathy. Lungs/Pleura: There is no pleural effusion, airspace consolidation or atelectasis. No pneumothorax. Multiple pulmonary nodules are again noted compatible with metastatic disease. A index lesion within the left upper lobe measures 1.8 cm, image 32/5. Index lesion within the left upper lobe measures 1.9 cm, image 47/5. Unchanged. The right upper lobe lung lesion measures 2.2 cm, image 59/5. Previously 2.0 cm. Subpleural lesion within the medial right lower lobe measures 4.2 cm, image 114/5. Previously 3.9 cm. Index lesion in the paramediastinal left lower lobe measures 3.8 cm, image 110/5. Previously 3.3 cm. Upper Abdomen: No acute abdominal  abnormality. Similar appearance of peripherally enhancing lesion within right lobe of liver measuring 1 cm, image 164/2. Musculoskeletal: No chest wall abnormality. No acute or significant osseous findings. IMPRESSION: 1. No significant interval change in the appearance of bilateral pulmonary metastatic disease. Most nodules are stable or minimally increased in size in the interval. Electronically Signed   By: TKerby MoorsM.D.   On: 02/24/2018 11:15    All questions were  answered. The patient knows to call the clinic with any problems, questions or concerns. No barriers to learning was detected.  I spent 30 minutes counseling the patient face to face. The total time spent in the appointment was 40 minutes and more than 50% was on counseling and review of test results  Heath Lark, MD 02/28/2018 8:44 AM

## 2018-02-28 NOTE — Assessment & Plan Note (Signed)
He is not symptomatic

## 2018-03-01 ENCOUNTER — Telehealth: Payer: Self-pay | Admitting: Pharmacist

## 2018-03-01 DIAGNOSIS — C080 Malignant neoplasm of submandibular gland: Secondary | ICD-10-CM

## 2018-03-01 MED ORDER — LENVATINIB (24 MG DAILY DOSE) 2 X 10 MG & 4 MG PO CPPK: 24.0000 mg | ORAL_CAPSULE | Freq: Every day | ORAL | 11 refills | Status: DC

## 2018-03-01 NOTE — Telephone Encounter (Signed)
Oral Oncology Pharmacist Encounter  Insurance authorization for [name] has been submitted on cover my meds  KEY: [id number] Status is pending  Clinical documentation including article from Journal of clinical oncology has been submitted with insurance authorization request.  Johny Drilling, PharmD, BCPS, BCOP  03/01/2018 2:58 PM [facility] [phone]

## 2018-03-01 NOTE — Telephone Encounter (Signed)
Oral Oncology Pharmacist Encounter  Received new prescription for Lenvima (lenvatinib) for the treatment of metastatic adenoid cystic carcinoma, s/p 3 separate excisions, planned duration until disease progression or unacceptable toxicity.  Michel Santee has positive published data from the Journal of Clinical Oncology in April 2019 (JCO 2019; 37(18): [id number]) showing the majority of patients with recurrent or metastatic adeniod cystic carcinoma experienced tumor regression with Lenvima dosed at 24 mg daily.  Labs from 02/23/18 assessed, OK for treatment.  No baseline TSH in Epic, will be monitored periodically while on treatment with Lenvima Last urin protein in 2018, will be monitored periodically while on treatment with Lenvima  BPs reviewed, reading WNL, will continue to be monitored  ECHO performed 07/27/17 shows LVEF 55-60% EKG performed 10/12/17 shows QTc 453 msec  No cardiac history noted, ECHO and/or EKG will be repeated at signs of cardiac dysfunction as cardiomyopathy and QTc prolongation have been observed with Lenvima use Electrolytes will be closely monitored while on treatment  Current medication list in Epic reviewed, no DDIs with Lenvima identified.  Prescription has been e-scribed to the [facility] for benefits analysis and approval.  Oral Oncology Clinic will continue to follow for insurance authorization, copayment issues, initial counseling and start date.  Johny Drilling, PharmD, BCPS, BCOP  03/01/2018 9:13 AM [facility] Oncology Clinic [phone]

## 2018-03-02 ENCOUNTER — Other Ambulatory Visit: Payer: Self-pay | Admitting: Hematology and Oncology

## 2018-03-02 DIAGNOSIS — C7800 Secondary malignant neoplasm of unspecified lung: Secondary | ICD-10-CM

## 2018-03-02 DIAGNOSIS — C080 Malignant neoplasm of submandibular gland: Secondary | ICD-10-CM

## 2018-03-02 MED ORDER — LENVATINIB (24 MG DAILY DOSE) 2 X 10 MG & 4 MG PO CPPK: 24.0000 mg | ORAL_CAPSULE | Freq: Every day | ORAL | 11 refills | Status: DC

## 2018-03-02 NOTE — Telephone Encounter (Signed)
Oral Oncology Pharmacist Encounter  Received notification from [facility] that they do not have access to dispense Lenvima. Patient is allowed to fill at [place] [facility], or [facility] specialty [facility].  Lenvima prescription has been E scribed to [facility]. I also faxed supporting information such as insurance card, demographic info, as well as insurance authorization.  I called patient and updated him about dispensing pharmacy change. I provided patient with contact phone number for [facility] of [phone].  Patient knows to call the office with any additional questions or concerns.  Johny Drilling, PharmD, BCPS, BCOP  03/02/2018 3:27 PM Oral Oncology Clinic [phone]

## 2018-03-02 NOTE — Telephone Encounter (Signed)
Oral Oncology Pharmacist Encounter  Insurance authorization for [name] has been approved. [name] must be filled at [facility] per insurance requirement. Prescription for [name] has been E scribed to [employer] [facility] [employer] in [place], [place], PharmD, BCPS, BCOP  03/02/2018 9:53 AM Oral Oncology Clinic [phone]

## 2018-03-02 NOTE — Telephone Encounter (Signed)
Oral Chemotherapy Pharmacist Encounter   I spoke with patient for overview of: Lenvima (lenvatinib) for the treatment of metastatic adenoid cystic carcinoma, s/p 3 separate excisions, planned duration until disease progression or unacceptable toxicity.   Counseled patient on administration, dosing, side effects, monitoring, drug-food interactions, safe handling, storage, and disposal.  Patient will take Lenvima 10mg  capsules x2 and Lenvima 4 capsules (24mg  total daily dose) by mouth once daily, with or without food, at approximately the same time each day.  Lenvima start date: TBD, week of 03/06/2018  Adverse effects include but are not limited to: hypertension, hand-foot syndrome, diarrhea, joint pain, fatigue, headache, decreased calcium, proteinuria, increased risk of blood clots, and cardiac conduction issues.   Patient will obtain anti diarrheal and alert the office of 4 or more loose stools above baseline.  Patient instructed to notify office of any upcoming invasive procedures.  [name] will be held for 6 days prior to scheduled surgery, restart based on healing and clinical judgement.   Reviewed with patient importance of keeping a medication schedule and plan for any missed doses.  [name] voiced understanding and appreciation.   All questions answered. Medication reconciliation performed and medication/allergy list updated.  Patient informed that insurance authorization for [name] has been approved. Prescription must be filled through [facility] per insurance requirement. Patient provided [facility] customer care phone number of [phone]  Patient informed that dispensing pharmacy will be reaching out to him in the next few days to provide co-pay information and to schedule delivery of his first shipment of [name].  Patient knows to call the office with questions or concerns. Oral Oncology Clinic will continue to follow.  Johny Drilling, PharmD, BCPS, BCOP   03/02/2018   10:51 AM Oral Oncology Clinic [phone]

## 2018-03-02 NOTE — Telephone Encounter (Signed)
Oral Oncology Patient Advocate Encounter  Prior Authorization for [name] has been approved.    PA# [id number] Effective dates: 03/01/18 through 03/02/19  Oral Oncology Clinic will continue to follow.   [place] [facility] [name] Phone [phone] Fax [phone]

## 2018-03-02 NOTE — Telephone Encounter (Signed)
If he gets his meds, OK to start 10/28 or 10/29 I have placed order for weekly labs in Nov and see me first week in December Jesse/Brenda, will need someone to look at his labs every week

## 2018-03-03 ENCOUNTER — Telehealth: Payer: Self-pay | Admitting: Hematology and Oncology

## 2018-03-03 MED ORDER — LENVATINIB (24 MG DAILY DOSE) 2 X 10 MG & 4 MG PO CPPK: 24.0000 mg | ORAL_CAPSULE | Freq: Every day | ORAL | 11 refills | Status: DC

## 2018-03-03 NOTE — Telephone Encounter (Signed)
Appts scheduled patient LMVM with date/time per 10/24 sch msg

## 2018-03-03 NOTE — Telephone Encounter (Signed)
Oral Oncology Pharmacist Encounter  [facility] follow-up on patient's Lenvima prescription. I was told by representative that they are not in network for patient's prescription insurance coverage and Michel Santee must be filled at [facility].  Lenvima prescription E scribed to [facility] pharmacy. I called and left voicemail for patient with update about dispensing pharmacy as well as dispensing pharmacy phone number of [phone].  I will call [facility] on Monday (03/03/2018) to follow-up on prescription processing.  Johny Drilling, PharmD, BCPS, BCOP  03/03/2018 4:08 PM Oral Oncology Clinic [phone]

## 2018-03-08 NOTE — Telephone Encounter (Addendum)
Oral Oncology Pharmacist Encounter  Called [facility] specialty pharmacy to follow-up on patient's [facility] prescription. I was provided phone number for the [facility] team as [phone].  Representative, Santiago Glad, stated that process had been completed for patient's Lenvima prescription and they would be reaching out to patient by the end of the business day today to schedule his first shipment of the lymphedema. Santiago Glad stated patient's Lenvima co-pay is $0.Lenvima  We will check in with dispensing pharmacy at the end of the week to ensure that medication did in fact ship.  Johny Drilling, PharmD, BCPS, BCOP  03/08/2018 1:11 PM Oral Oncology Clinic [phone]

## 2018-03-09 NOTE — Telephone Encounter (Signed)
Oral Oncology Pharmacist Encounter  Received call from patient that first [place] shipment has arrived today (03/09/2018). He will start today.  We reviewed that patient should take Lenvima 24 mg by mouth at the same time once daily made up of 2 X 10 mg capsules plus 1 of the 4 mg capsules.  Patient reminded of lab check scheduled on 03/14/2018  and weekly thereafter until office visit with Dr. Alvy Bimler on 04/13/18.  Patient knows to call the office with any additional questions or concerns.  Johny Drilling, PharmD, BCPS, BCOP  03/09/2018 2:47 PM Oral Oncology Clinic [phone]

## 2018-03-10 ENCOUNTER — Encounter: Payer: Self-pay | Admitting: Family Medicine

## 2018-03-10 ENCOUNTER — Ambulatory Visit (INDEPENDENT_AMBULATORY_CARE_PROVIDER_SITE_OTHER): Payer: 59 | Admitting: Family Medicine

## 2018-03-10 VITALS — BP 122/81 | HR 68 | Temp 97.5°F | Ht 72.0 in | Wt 205.0 lb

## 2018-03-10 DIAGNOSIS — F339 Major depressive disorder, recurrent, unspecified: Secondary | ICD-10-CM

## 2018-03-10 DIAGNOSIS — M5416 Radiculopathy, lumbar region: Secondary | ICD-10-CM

## 2018-03-10 MED ORDER — DULOXETINE HCL 30 MG PO CPEP: 90.0000 mg | ORAL_CAPSULE | Freq: Every day | ORAL | 1 refills | Status: DC

## 2018-03-10 NOTE — Progress Notes (Signed)
BP 122/81   Pulse 68   Temp (!) 97.5 F (36.4 C) (Oral)   Ht 6' (1.829 m)   Wt 205 lb (93 kg)   BMI 27.80 kg/m    Subjective:    Patient ID: [name], male    DOB: [birth year], 42 y.o.   MRN: [id number]  HPI: [name] is a 42 y.o. male presenting on 03/10/2018 for Depression (2 week follow up- Patient states it has improved.)   HPI Depression recheck Patient is coming in for depression recheck and he says he is feeling very much in a good place.  He says his anger and anxiety has been under control and feel down and is even been smiling and laughing with friends and is not as irritable and he overall is just feeling so much better right now and says the Cymbalta is doing very well for him.  The nerve shooting pain has still been doing very good and he is taking the Cymbalta 90 currently.  He is very optimistic about his new cancer treatment they have started it which gives as 75% chance of stabilizing the disease and preventing progression Depression screen [facility] 2/9 03/10/2018 02/24/2018 02/10/2018 10/06/2016 09/17/2016  Decreased Interest 2 3 3 3  0  Down, Depressed, Hopeless 1 3 3 3  -  PHQ - 2 Score 3 6 6 6  0  Altered sleeping 1 3 3 3  -  Tired, decreased energy 3 3 3 3  -  Change in appetite 2 0 3 3 -  Feeling bad or failure about yourself  2 3 3  (No Data) -  Trouble concentrating 3 3 3  - -  Moving slowly or fidgety/restless 3 0 3 - -  Suicidal thoughts 1 - 3 - -  PHQ-9 Score 18 18 27 15  -  Difficult doing work/chores - - Extremely dIfficult - -     Relevant past medical, surgical, family and social history reviewed and updated as indicated. Interim medical history since our last visit reviewed. Allergies and medications reviewed and updated.  Review of Systems  Constitutional: Negative for chills and fever.  Eyes: Negative for visual disturbance.  Respiratory: Negative for shortness of breath and wheezing.   Cardiovascular: Negative for chest pain and leg swelling.   Musculoskeletal: Negative for back pain and gait problem.  Skin: Negative for rash.  Neurological: Negative for dizziness, weakness, light-headedness and numbness.  Psychiatric/Behavioral: Positive for decreased concentration and dysphoric mood. Negative for self-injury, sleep disturbance and suicidal ideas. The patient is nervous/anxious.   All other systems reviewed and are negative.   Per HPI unless specifically indicated above   Allergies as of 03/10/2018      Reactions   Hydrocodone Itching      Medication List        Accurate as of 03/10/18  2:00 PM. Always use your most recent med list.          cyclobenzaprine 10 MG tablet Commonly known as:  FLEXERIL Take 1 tablet (10 mg total) by mouth 3 (three) times daily as needed for muscle spasms.   diazepam 5 MG tablet Commonly known as:  VALIUM Take 1 tablet (5 mg total) by mouth every 8 (eight) hours as needed for anxiety.   DULoxetine 30 MG capsule Commonly known as:  CYMBALTA Take 3 capsules (90 mg total) by mouth daily.   lenvatinib 24 mg daily dose 2 x 10 MG & 4 MG capsule Commonly known as:  LENVIMA Take 24 mg by mouth  daily.   ondansetron 8 MG tablet Commonly known as:  ZOFRAN Take 8 mg by mouth every 8 (eight) hours as needed for nausea or vomiting.   PEPCID AC 10 MG tablet Generic drug:  famotidine Take 10 mg daily as needed by mouth for heartburn or indigestion.   prochlorperazine 10 MG tablet Commonly known as:  COMPAZINE Take 10 mg by mouth every 6 (six) hours as needed for nausea or vomiting.          Objective:    BP 122/81   Pulse 68   Temp (!) 97.5 F (36.4 C) (Oral)   Ht 6' (1.829 m)   Wt 205 lb (93 kg)   BMI 27.80 kg/m   Wt Readings from Last 3 Encounters:  03/10/18 205 lb (93 kg)  02/27/18 202 lb 9.6 oz (91.9 kg)  02/24/18 202 lb (91.6 kg)    Physical Exam  Constitutional: He is oriented to person, place, and time. He appears well-developed and well-nourished. No distress.   Eyes: Conjunctivae are normal. No scleral icterus.  Neck: Neck supple. No thyromegaly present.  Cardiovascular: Normal rate, regular rhythm, normal heart sounds and intact distal pulses.  No murmur heard. Pulmonary/Chest: Effort normal and breath sounds normal. No respiratory distress. He has no wheezes.  Musculoskeletal: Normal range of motion. He exhibits no edema.  Lymphadenopathy:    He has no cervical adenopathy.  Neurological: He is alert and oriented to person, place, and time. Coordination normal.  Skin: Skin is warm and dry. No rash noted. He is not diaphoretic.  Psychiatric: His behavior is normal. His mood appears anxious. He exhibits a depressed mood. He expresses no suicidal ideation. He expresses no suicidal plans.  Nursing note and vitals reviewed.       Assessment & Plan:   Problem List Items Addressed This Visit    None    Visit Diagnoses    Depression, recurrent ([place])    -  Primary   Relevant Medications   DULoxetine (CYMBALTA) 30 MG capsule   Lumbar back pain with radiculopathy affecting left lower extremity       Relevant Medications   DULoxetine (CYMBALTA) 30 MG capsule       Follow up plan: Return in about 4 weeks (around 04/07/2018), or if symptoms worsen or fail to improve, for Depression and anxiety.  Counseling provided for all of the vaccine components No orders of the defined types were placed in this encounter.   Donald Pina, MD [name] 03/10/2018, 2:00 PM

## 2018-03-14 ENCOUNTER — Telehealth: Payer: Self-pay

## 2018-03-14 ENCOUNTER — Inpatient Hospital Stay: Payer: 59 | Attending: Hematology and Oncology

## 2018-03-14 DIAGNOSIS — C7800 Secondary malignant neoplasm of unspecified lung: Secondary | ICD-10-CM

## 2018-03-14 DIAGNOSIS — C7801 Secondary malignant neoplasm of right lung: Secondary | ICD-10-CM | POA: Diagnosis not present

## 2018-03-14 DIAGNOSIS — C080 Malignant neoplasm of submandibular gland: Secondary | ICD-10-CM

## 2018-03-14 DIAGNOSIS — C7802 Secondary malignant neoplasm of left lung: Secondary | ICD-10-CM | POA: Diagnosis not present

## 2018-03-14 LAB — CBC WITH DIFFERENTIAL/PLATELET
Abs Immature Granulocytes: 0.03 10*3/uL (ref 0.00–0.07)
Basophils Absolute: 0 10*3/uL (ref 0.0–0.1)
Basophils Relative: 1 %
EOS ABS: 0 10*3/uL (ref 0.0–0.5)
Eosinophils Relative: 1 %
HCT: 41.4 % (ref 39.0–52.0)
Hemoglobin: 13.8 g/dL (ref 13.0–17.0)
IMMATURE GRANULOCYTES: 1 %
Lymphocytes Relative: 21 %
Lymphs Abs: 1.3 10*3/uL (ref 0.7–4.0)
MCH: 28 pg (ref 26.0–34.0)
MCHC: 33.3 g/dL (ref 30.0–36.0)
MCV: 84.1 fL (ref 80.0–100.0)
MONO ABS: 0.5 10*3/uL (ref 0.1–1.0)
Monocytes Relative: 9 %
NEUTROS ABS: 4.2 10*3/uL (ref 1.7–7.7)
NEUTROS PCT: 67 %
PLATELETS: 197 10*3/uL (ref 150–400)
RBC: 4.92 MIL/uL (ref 4.22–5.81)
RDW: 12.8 % (ref 11.5–15.5)
WBC: 6.1 10*3/uL (ref 4.0–10.5)
nRBC: 0 % (ref 0.0–0.2)

## 2018-03-14 LAB — COMPREHENSIVE METABOLIC PANEL
ALT: 27 U/L (ref 0–44)
AST: 30 U/L (ref 15–41)
Albumin: 4.4 g/dL (ref 3.5–5.0)
Alkaline Phosphatase: 90 U/L (ref 38–126)
Anion gap: 9 (ref 5–15)
BUN: 11 mg/dL (ref 6–20)
CHLORIDE: 105 mmol/L (ref 98–111)
CO2: 25 mmol/L (ref 22–32)
Calcium: 9.8 mg/dL (ref 8.9–10.3)
Creatinine, Ser: 1 mg/dL (ref 0.61–1.24)
GFR calc non Af Amer: >60 mL/min (ref >60)
Glucose, Bld: 85 mg/dL (ref 70–99)
POTASSIUM: 4 mmol/L (ref 3.5–5.1)
SODIUM: 139 mmol/L (ref 135–145)
Total Bilirubin: 0.6 mg/dL (ref 0.3–1.2)
Total Protein: 7.6 g/dL (ref 6.5–8.1)

## 2018-03-14 LAB — TOTAL PROTEIN, URINE DIPSTICK: PROTEIN: NEGATIVE mg/dL

## 2018-03-14 LAB — TSH: TSH: 2.145 u[IU]/mL (ref 0.320–4.118)

## 2018-03-14 NOTE — Telephone Encounter (Signed)
Called and left message that today's lab are good, reviewed with Mosetta Pigeon, Pharmacist.  Instructed and to keep appt for next week and call for questions.

## 2018-03-21 ENCOUNTER — Telehealth: Payer: Self-pay

## 2018-03-21 ENCOUNTER — Inpatient Hospital Stay: Payer: 59

## 2018-03-21 DIAGNOSIS — C080 Malignant neoplasm of submandibular gland: Secondary | ICD-10-CM | POA: Diagnosis not present

## 2018-03-21 DIAGNOSIS — C7800 Secondary malignant neoplasm of unspecified lung: Secondary | ICD-10-CM

## 2018-03-21 LAB — CBC WITH DIFFERENTIAL/PLATELET
ABS IMMATURE GRANULOCYTES: 0 10*3/uL (ref 0.00–0.07)
BASOS PCT: 1 %
Basophils Absolute: 0 10*3/uL (ref 0.0–0.1)
Eosinophils Absolute: 0.1 10*3/uL (ref 0.0–0.5)
Eosinophils Relative: 1 %
HCT: 44.1 % (ref 39.0–52.0)
Hemoglobin: 14.8 g/dL (ref 13.0–17.0)
Immature Granulocytes: 0 %
Lymphocytes Relative: 25 %
Lymphs Abs: 1.5 10*3/uL (ref 0.7–4.0)
MCH: 28.3 pg (ref 26.0–34.0)
MCHC: 33.6 g/dL (ref 30.0–36.0)
MCV: 84.3 fL (ref 80.0–100.0)
MONO ABS: 0.5 10*3/uL (ref 0.1–1.0)
Monocytes Relative: 9 %
NRBC: 0 % (ref 0.0–0.2)
Neutro Abs: 3.8 10*3/uL (ref 1.7–7.7)
Neutrophils Relative %: 64 %
PLATELETS: 189 10*3/uL (ref 150–400)
RBC: 5.23 MIL/uL (ref 4.22–5.81)
RDW: 13.3 % (ref 11.5–15.5)
WBC: 5.9 10*3/uL (ref 4.0–10.5)

## 2018-03-21 LAB — COMPREHENSIVE METABOLIC PANEL
ALBUMIN: 4.3 g/dL (ref 3.5–5.0)
ALK PHOS: 96 U/L (ref 38–126)
ALT: 28 U/L (ref 0–44)
AST: 32 U/L (ref 15–41)
Anion gap: 9 (ref 5–15)
BUN: 18 mg/dL (ref 6–20)
CHLORIDE: 105 mmol/L (ref 98–111)
CO2: 24 mmol/L (ref 22–32)
CREATININE: 1.07 mg/dL (ref 0.61–1.24)
Calcium: 9.6 mg/dL (ref 8.9–10.3)
GFR calc non Af Amer: >60 mL/min (ref >60)
GLUCOSE: 84 mg/dL (ref 70–99)
Potassium: 4.4 mmol/L (ref 3.5–5.1)
SODIUM: 138 mmol/L (ref 135–145)
Total Bilirubin: 0.6 mg/dL (ref 0.3–1.2)
Total Protein: 7.6 g/dL (ref 6.5–8.1)

## 2018-03-21 NOTE — Telephone Encounter (Signed)
Called and left a message that he labs looked good. Reviewed by A. Hollice Espy, pharmacist at [facility]. To continue Lenvima as directed and to call the office if needed.

## 2018-03-28 ENCOUNTER — Inpatient Hospital Stay: Payer: 59

## 2018-03-28 DIAGNOSIS — C7800 Secondary malignant neoplasm of unspecified lung: Secondary | ICD-10-CM

## 2018-03-28 DIAGNOSIS — C080 Malignant neoplasm of submandibular gland: Secondary | ICD-10-CM | POA: Diagnosis not present

## 2018-03-28 LAB — COMPREHENSIVE METABOLIC PANEL
ALBUMIN: 4.4 g/dL (ref 3.5–5.0)
ALK PHOS: 95 U/L (ref 38–126)
ALT: 24 U/L (ref 0–44)
ANION GAP: 12 (ref 5–15)
AST: 26 U/L (ref 15–41)
BILIRUBIN TOTAL: 0.9 mg/dL (ref 0.3–1.2)
BUN: 13 mg/dL (ref 6–20)
CALCIUM: 10 mg/dL (ref 8.9–10.3)
CO2: 24 mmol/L (ref 22–32)
Chloride: 105 mmol/L (ref 98–111)
Creatinine, Ser: 1.16 mg/dL (ref 0.61–1.24)
GFR calc non Af Amer: >60 mL/min (ref >60)
Glucose, Bld: 96 mg/dL (ref 70–99)
POTASSIUM: 4.2 mmol/L (ref 3.5–5.1)
Sodium: 141 mmol/L (ref 135–145)
TOTAL PROTEIN: 7.8 g/dL (ref 6.5–8.1)

## 2018-03-28 LAB — CBC WITH DIFFERENTIAL/PLATELET
Abs Immature Granulocytes: 0.01 10*3/uL (ref 0.00–0.07)
BASOS ABS: 0 10*3/uL (ref 0.0–0.1)
Basophils Relative: 0 %
EOS ABS: 0 10*3/uL (ref 0.0–0.5)
EOS PCT: 1 %
HEMATOCRIT: 45 % (ref 39.0–52.0)
HEMOGLOBIN: 15.3 g/dL (ref 13.0–17.0)
Immature Granulocytes: 0 %
LYMPHS ABS: 1.2 10*3/uL (ref 0.7–4.0)
LYMPHS PCT: 18 %
MCH: 28.2 pg (ref 26.0–34.0)
MCHC: 34 g/dL (ref 30.0–36.0)
MCV: 82.9 fL (ref 80.0–100.0)
Monocytes Absolute: 0.5 10*3/uL (ref 0.1–1.0)
Monocytes Relative: 8 %
NRBC: 0 % (ref 0.0–0.2)
Neutro Abs: 4.8 10*3/uL (ref 1.7–7.7)
Neutrophils Relative %: 73 %
Platelets: 174 10*3/uL (ref 150–400)
RBC: 5.43 MIL/uL (ref 4.22–5.81)
RDW: 13.3 % (ref 11.5–15.5)
WBC: 6.5 10*3/uL (ref 4.0–10.5)

## 2018-03-29 ENCOUNTER — Telehealth: Payer: Self-pay

## 2018-03-29 NOTE — Telephone Encounter (Signed)
Spoke with patient today that per Evelena Peat in oral chemo, labs look good and ok to continue taking Lenvima.  Patient voiced understanding.

## 2018-04-04 ENCOUNTER — Inpatient Hospital Stay: Payer: 59

## 2018-04-04 DIAGNOSIS — C080 Malignant neoplasm of submandibular gland: Secondary | ICD-10-CM | POA: Diagnosis not present

## 2018-04-04 DIAGNOSIS — C7800 Secondary malignant neoplasm of unspecified lung: Secondary | ICD-10-CM

## 2018-04-04 LAB — CBC WITH DIFFERENTIAL/PLATELET
ABS IMMATURE GRANULOCYTES: 0.01 10*3/uL (ref 0.00–0.07)
BASOS PCT: 0 %
Basophils Absolute: 0 10*3/uL (ref 0.0–0.1)
Eosinophils Absolute: 0.1 10*3/uL (ref 0.0–0.5)
Eosinophils Relative: 2 %
HCT: 42.8 % (ref 39.0–52.0)
Hemoglobin: 14.2 g/dL (ref 13.0–17.0)
IMMATURE GRANULOCYTES: 0 %
LYMPHS ABS: 1.5 10*3/uL (ref 0.7–4.0)
LYMPHS PCT: 23 %
MCH: 28.2 pg (ref 26.0–34.0)
MCHC: 33.2 g/dL (ref 30.0–36.0)
MCV: 84.9 fL (ref 80.0–100.0)
MONOS PCT: 6 %
Monocytes Absolute: 0.4 10*3/uL (ref 0.1–1.0)
NEUTROS ABS: 4.6 10*3/uL (ref 1.7–7.7)
NEUTROS PCT: 69 %
PLATELETS: 155 10*3/uL (ref 150–400)
RBC: 5.04 MIL/uL (ref 4.22–5.81)
RDW: 14 % (ref 11.5–15.5)
WBC: 6.7 10*3/uL (ref 4.0–10.5)
nRBC: 0 % (ref 0.0–0.2)

## 2018-04-04 LAB — COMPREHENSIVE METABOLIC PANEL
ALT: 23 U/L (ref 0–44)
AST: 27 U/L (ref 15–41)
Albumin: 4.1 g/dL (ref 3.5–5.0)
Alkaline Phosphatase: 97 U/L (ref 38–126)
Anion gap: 9 (ref 5–15)
BUN: 12 mg/dL (ref 6–20)
CHLORIDE: 106 mmol/L (ref 98–111)
CO2: 26 mmol/L (ref 22–32)
Calcium: 9.7 mg/dL (ref 8.9–10.3)
Creatinine, Ser: 1.07 mg/dL (ref 0.61–1.24)
GFR calc Af Amer: >60 mL/min (ref >60)
GLUCOSE: 110 mg/dL — AB (ref 70–99)
Potassium: 4.2 mmol/L (ref 3.5–5.1)
Sodium: 141 mmol/L (ref 135–145)
Total Bilirubin: 0.3 mg/dL (ref 0.3–1.2)
Total Protein: 7.1 g/dL (ref 6.5–8.1)

## 2018-04-05 ENCOUNTER — Telehealth: Payer: Self-pay

## 2018-04-05 NOTE — Telephone Encounter (Signed)
Called and told hime that today's lab are good, reviewed with Mosetta Pigeon, Pharmacist.  Instructed and to keep appt for next week and call for questions. He verbalized understanding.

## 2018-04-10 ENCOUNTER — Telehealth: Payer: Self-pay | Admitting: Hematology and Oncology

## 2018-04-10 NOTE — Telephone Encounter (Signed)
Faxed medical records to [employer] attn:April [place] at [phone], Release [id number]

## 2018-04-11 ENCOUNTER — Inpatient Hospital Stay: Payer: BLUE CROSS/BLUE SHIELD | Attending: Hematology and Oncology

## 2018-04-11 ENCOUNTER — Inpatient Hospital Stay (HOSPITAL_BASED_OUTPATIENT_CLINIC_OR_DEPARTMENT_OTHER): Payer: BLUE CROSS/BLUE SHIELD | Admitting: Hematology and Oncology

## 2018-04-11 DIAGNOSIS — C7800 Secondary malignant neoplasm of unspecified lung: Secondary | ICD-10-CM

## 2018-04-11 DIAGNOSIS — I10 Essential (primary) hypertension: Secondary | ICD-10-CM | POA: Diagnosis not present

## 2018-04-11 DIAGNOSIS — Z79899 Other long term (current) drug therapy: Secondary | ICD-10-CM | POA: Insufficient documentation

## 2018-04-11 DIAGNOSIS — C080 Malignant neoplasm of submandibular gland: Secondary | ICD-10-CM | POA: Diagnosis not present

## 2018-04-11 LAB — COMPREHENSIVE METABOLIC PANEL
ALBUMIN: 4.5 g/dL (ref 3.5–5.0)
ALT: 34 U/L (ref 0–44)
AST: 35 U/L (ref 15–41)
Alkaline Phosphatase: 96 U/L (ref 38–126)
Anion gap: 10 (ref 5–15)
BUN: 15 mg/dL (ref 6–20)
CALCIUM: 10 mg/dL (ref 8.9–10.3)
CO2: 28 mmol/L (ref 22–32)
Chloride: 104 mmol/L (ref 98–111)
Creatinine, Ser: 1.14 mg/dL (ref 0.61–1.24)
GFR calc non Af Amer: >60 mL/min (ref >60)
GLUCOSE: 87 mg/dL (ref 70–99)
POTASSIUM: 4.5 mmol/L (ref 3.5–5.1)
SODIUM: 142 mmol/L (ref 135–145)
TOTAL PROTEIN: 7.8 g/dL (ref 6.5–8.1)
Total Bilirubin: 0.8 mg/dL (ref 0.3–1.2)

## 2018-04-11 LAB — CBC WITH DIFFERENTIAL/PLATELET
Abs Immature Granulocytes: 0.01 10*3/uL (ref 0.00–0.07)
BASOS ABS: 0 10*3/uL (ref 0.0–0.1)
BASOS PCT: 0 %
EOS ABS: 0.1 10*3/uL (ref 0.0–0.5)
EOS PCT: 1 %
HCT: 46.3 % (ref 39.0–52.0)
Hemoglobin: 15.5 g/dL (ref 13.0–17.0)
Immature Granulocytes: 0 %
Lymphocytes Relative: 23 %
Lymphs Abs: 1.7 10*3/uL (ref 0.7–4.0)
MCH: 28.2 pg (ref 26.0–34.0)
MCHC: 33.5 g/dL (ref 30.0–36.0)
MCV: 84.2 fL (ref 80.0–100.0)
Monocytes Absolute: 0.6 10*3/uL (ref 0.1–1.0)
Monocytes Relative: 9 %
Neutro Abs: 4.9 10*3/uL (ref 1.7–7.7)
Neutrophils Relative %: 67 %
Platelets: 187 10*3/uL (ref 150–400)
RBC: 5.5 MIL/uL (ref 4.22–5.81)
RDW: 14.4 % (ref 11.5–15.5)
WBC: 7.3 10*3/uL (ref 4.0–10.5)
nRBC: 0 % (ref 0.0–0.2)

## 2018-04-11 MED ORDER — AMLODIPINE BESYLATE 5 MG PO TABS: 5.0000 mg | ORAL_TABLET | Freq: Every day | ORAL | 1 refills | Status: DC

## 2018-04-12 ENCOUNTER — Encounter: Payer: Self-pay | Admitting: Hematology and Oncology

## 2018-04-12 DIAGNOSIS — I10 Essential (primary) hypertension: Secondary | ICD-10-CM | POA: Insufficient documentation

## 2018-04-12 NOTE — Assessment & Plan Note (Signed)
The patient has headaches and he is hypertensive especially with diastolic blood pressure number typically running high I recommend starting him on low-dose amlodipine and hold his treatment for 3 days I will call him next week for further follow-up

## 2018-04-12 NOTE — Progress Notes (Signed)
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[place]) He tolerated treatment well except for headaches and hypertension I recommend holding his pill for 3 days and I will start him on antihypertensives I will call him next week to see how he is doing If his blood pressure improves, we will continue the same dose of treatment However, if his blood pressure remain high despite aggressive blood pressure management, we might have a reduce the dose of his treatment I recommend minimum 3 months of treatment before repeat imaging study I will see him back at the end of the month for further follow-up  Metastasis to lung [facility]) He is not symptomatic Plan to recheck imaging study in 3 months after start of treatment  Essential hypertension The patient has headaches and he is hypertensive especially with diastolic blood pressure number typically running high I recommend starting him on low-dose amlodipine and hold his treatment for 3 days I will call him next week for further follow-up    No orders of the defined types were placed in this encounter.   INTERVAL HISTORY: Please see below for problem oriented charting. He returns for further follow-up He tolerated chemo well without nausea, vomiting or changes in bowel habits No new lesions in his neck Denies recent cough, chest pain or shortness of breath His only complaints are significant headache and elevated blood pressure He has intermittent changes in the right eyesight that comes and goes.  Denies leg swelling.  No recent bleeding.  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  No acute intracranial abnormality. 2. Swollen right submandibular gland containing prominent calcifications consistent with submandibular gland stones. Submandibular gland enlargement is most likely inflammatory. Malignancy cannot be entirely excluded .    07/12/2014 Pathology Results    Submandibular gland, Right - ADENOID CYSTIC CARCINOMA, GRADE I OF III, SEE COMMENT. - POSITIVE FOR PERINEURAL INVASION. - TUMOR INVOLVES SURGICAL EXCISIONAL MARGIN - SEE TUMOR SYNOPTIC TEMPLATE BELOW. Microscopic Comment ONCOLOGY TABLE - SALIVARY GLAND 1. Specimen: Right submandibular gland 2. Procedure: Excision. 3. Tumor site and laterality: Right submandibular gland 4. Tumor focality: Unifocal 5. Maximum tumor size (cm): 3.8 cm 6. Histologic type: Adenoid cystic carcinoma. 7. Grade: I of III 8. Margins: Positive Distance of tumor from closest margin: Present at margin 9. Perineural invasion: Present 10. Lymph-Vascular invasion: Absent. 11. Lymph nodes: # examined - 0; # positive - N/A; extracapsular extension - N/A 12. TNM code: pT2, pNX, pMX    07/12/2014 Surgery    Right submandibular gland excision     07/24/2014 Miscellaneous    The patient was seen by radiation oncologist locally who offered him adjuvant radiation treatment.  The patient obtained second opinion at [facility] who recommended against adjuvant radiation treatment.  The patient finally decided not proceed with radiation therapy    07/29/2014 Imaging    No findings specific for metastatic disease in the chest or abdomen. 4 mm subpleural nodule in the medial left lower lobe, likely reflecting a benign subpleural lymph node, although technically indeterminate. Attention on follow-up is suggested.     07/30/2014 Imaging  MR face: Postop resection of right submandibular gland for adenoid cystic carcinoma. No evidence of perineural spread. No skullbase or cavernous sinus involvement.     08/12/2014 Surgery     Right level I, 2 neck selective dissection     08/12/2014 Pathology Results    Lymph nodes, radical neck dissection, limited right neck - BENIGN SALIVARY GLAND TISSUE; NEGATIVE FOR ATYPIA OR MALIGNANCY. - BENIGN SKIN WITH DERMAL AND SUBCUTANEOUS SCAR AND INFLAMMATORY TISSUE REACTION. - TWO INTRA-SALIVARY GLAND LYMPH NODES, NEGATIVE FOR TUMOR (0/2) - ONE LEVEL ONE-A LYMPH NODE, NEGATIVE FOR TUMOR (0/1). - SIX LEVEL TWO LYMPH NODES, NEGATIVE FOR TUMOR (0/6)    04/07/2015 Imaging    MR neck: 1. Status post resection of right submandibular gland. 2. No evidence for perineural spread or tumor recurrence. 3. Stable central and bilateral foraminal narrowing at C6-7.    09/25/2015 Imaging    MR neck: Stable exam. No evidence of residual or recurrent right submandibular carcinoma.    07/08/2016 Procedure    He underwent repeat excision of right submandibular mass: A 2 x 3 cm ellipse was made around mass incorporating the scar. This was carried down through skin and subcutaneous fat and the dissection was completed at the level of the sternomastoid muscle. Examination of the specimen revealed complete removal. This was sent for pathologic interpretation.         07/09/2016 Pathology Results    Adenoid cystic carcinoma (margins free of neoplasm)    08/06/2016 Imaging    CT scan of chest, abdomen and pelvis: 1. There are now multiple pulmonary nodules consistent with metastatic disease to the lungs. One nodule was present previously, measuring 4 mm, noted along the pleural margin of the left lower lobe. This is most likely a benign granuloma. The remaining visualized nodules are new consistent with metastatic disease, given the patient's history. 2. No other findings of metastatic disease within the chest. 3. No evidence of metastatic disease in the abdomen or pelvis. 4. No acute findings.    08/06/2016 Imaging    Ct neck: 1. Multiple new bilateral apical pulmonary nodules measuring up to 1.3 cm and  concerning for metastatic disease. 2. 22 x 7 mm right level III lymph node, enlarged from 2016 and indeterminate. 3. Prior right submandibular gland resection without evidence of locally recurrent mass.    08/06/2016 Imaging    MR face: Prior right submandibular gland resection without evidence of recurrent tumor    10/12/2016 Imaging    There is no new mediastinal or hilar lymphadenopathy.  The heart is normal in size and there is no pericardial or pleural effusion.  Bilateral pulmonary nodules have slightly increased in size, suspicious for metastatic disease. For instance a 14 mm nodule on image 39 previously measured 12 mm. The largest nodule in the right lower lobe measures 24 mm in diameter.  Limited imaging through the upper abdomen shows normal adrenal glands.    10/12/2016 Imaging    CT neck 1.No evidence for primary or nodal recurrence in the neck. 2.Bilateral pulmonary nodules.    02/16/2017 Imaging    Ct chest showed enlarging pulmonary metastases.    02/16/2017 Imaging    1. Unchanged examination of the neck with stable size of right level 3 and left level IIa lymph nodes. 2. Please see dedicated report for concomitant chest CT for findings below thoracic inlet.    03/25/2017 Procedure    Technically successful CT guided core needle core biopsy of indeterminate enlarging left lower lobe  pulmonary nodule.    03/25/2017 Pathology Results    Lung, biopsy, Left lower lobe - FINDINGS CONSISTENT WITH METASTATIC ADENOID CYSTIC CARCINOMA, SEE COMMENT. Microscopic Comment There is a small focus of tumor with similar morphology to the patient's prior adenoid cystic carcinoma of the submandibular gland ([id number], reviewed).     04/18/2017 Imaging    Progressive bilateral pulmonary metastases, as above. Three subcentimeter hypervascular lesions in the right liver, unchanged from March 2018, indeterminate.    05/05/2017 Procedure    Successful placement of a right internal  jugular approach power injectable Port-A-Cath. The catheter is ready for immediate use    05/16/2017 -  Chemotherapy    The patient had chemotherapy with cisplatin, cytoxan and doxorubicin    07/27/2017 Imaging    ECHO: LV EF: 55% -  60%    08/04/2017 Imaging    CT neck No locally recurrent tumor status post RIGHT submandibular gland excision for adenoid cystic carcinoma.  Previously identified regional lymph nodes are smaller and non worrisome.  Please see CT chest report for additional important findings.    08/04/2017 Imaging    CT chest 1. Stable pulmonary metastatic disease. No new or progressive findings are identified. 2. No mediastinal or hilar mass or adenopathy. 3. No new/acute pulmonary findings.    11/23/2017 Imaging    Pulmonary metastatic disease is stable.    11/30/2017 Procedure    Removal of implanted Port-A-Cath utilizing sharp and blunt dissection. The procedure was uncomplicated.    02/24/2018 Imaging    No significant interval change in the appearance of bilateral pulmonary metastatic disease. Most nodules are stable or minimally increased in size in the interval.     REVIEW OF SYSTEMS:   Constitutional: Denies fevers, chills or abnormal weight loss Eyes: Denies blurriness of vision Ears, nose, mouth, throat, and face: Denies mucositis or sore throat Respiratory: Denies cough, dyspnea or wheezes Cardiovascular: Denies palpitation, chest discomfort or lower extremity swelling Gastrointestinal:  Denies nausea, heartburn or change in bowel habits Skin: Denies abnormal skin rashes Lymphatics: Denies new lymphadenopathy or easy bruising Neurological:Denies numbness, tingling or new weaknesses Behavioral/Psych: Mood is stable, no new changes  All other systems were reviewed with the patient and are negative.  I have reviewed the past medical history, past surgical history, social history and family history with the patient and they are unchanged from previous  note.  ALLERGIES:  is allergic to hydrocodone.  MEDICATIONS:  Current Outpatient Medications  Medication Sig Dispense Refill  . amLODipine (NORVASC) 5 MG tablet Take 1 tablet (5 mg total) by mouth daily. 90 tablet 1  . cyclobenzaprine (FLEXERIL) 10 MG tablet Take 1 tablet (10 mg total) by mouth 3 (three) times daily as needed for muscle spasms. 90 tablet 1  . diazepam (VALIUM) 5 MG tablet Take 1 tablet (5 mg total) by mouth every 8 (eight) hours as needed for anxiety. 90 tablet 1  . DULoxetine (CYMBALTA) 30 MG capsule Take 3 capsules (90 mg total) by mouth daily. 90 capsule 1  . famotidine (PEPCID AC) 10 MG tablet Take 10 mg daily as needed by mouth for heartburn or indigestion.    Marland Kitchen lenvatinib 24 mg daily dose (LENVIMA) 2 x 10 MG & 4 MG capsule Take 24 mg by mouth daily. 90 capsule 11  . ondansetron (ZOFRAN) 8 MG tablet Take 8 mg by mouth every 8 (eight) hours as needed for nausea or vomiting.    . prochlorperazine (COMPAZINE) 10 MG tablet Take 10 mg by mouth  every 6 (six) hours as needed for nausea or vomiting.     No current facility-administered medications for this visit.     PHYSICAL EXAMINATION: ECOG PERFORMANCE STATUS: 1 - Symptomatic but completely ambulatory  Vitals:   04/11/18 1328  BP: (!) 131/101  Pulse: 69  Resp: 18  Temp: 98.2 F (36.8 C)  SpO2: 99%   Filed Weights   04/11/18 1328  Weight: 206 lb 6.4 oz (93.6 kg)    GENERAL:alert, no distress and comfortable SKIN: skin color, texture, turgor are normal, no rashes or significant lesions EYES: normal, Conjunctiva are pink and non-injected, sclera clear OROPHARYNX:no exudate, no erythema and lips, buccal mucosa, and tongue normal  NECK: supple, thyroid normal size, non-tender, without nodularity LYMPH:  no palpable lymphadenopathy in the cervical, axillary or inguinal LUNGS: clear to auscultation and percussion with normal breathing effort HEART: regular rate & rhythm and no murmurs and no lower extremity  edema ABDOMEN:abdomen soft, non-tender and normal bowel sounds Musculoskeletal:no cyanosis of digits and no clubbing  NEURO: alert & oriented x 3 with fluent speech, no focal motor/sensory deficits  LABORATORY DATA:  I have reviewed the data as listed    Component Value Date/Time   NA 142 04/11/2018 1143   NA 142 05/11/2017 0917   K 4.5 04/11/2018 1143   K 4.1 05/11/2017 0917   CL 104 04/11/2018 1143   CO2 28 04/11/2018 1143   CO2 26 05/11/2017 0917   GLUCOSE 87 04/11/2018 1143   GLUCOSE 82 05/11/2017 0917   BUN 15 04/11/2018 1143   BUN 14.2 05/11/2017 0917   CREATININE 1.14 04/11/2018 1143   CREATININE 0.9 05/11/2017 0917   CALCIUM 10.0 04/11/2018 1143   CALCIUM 9.6 05/11/2017 0917   PROT 7.8 04/11/2018 1143   PROT 7.7 05/11/2017 0917   ALBUMIN 4.5 04/11/2018 1143   ALBUMIN 4.2 05/11/2017 0917   AST 35 04/11/2018 1143   AST 53 (H) 05/11/2017 0917   ALT 34 04/11/2018 1143   ALT 61 (H) 05/11/2017 0917   ALKPHOS 96 04/11/2018 1143   ALKPHOS 114 05/11/2017 0917   BILITOT 0.8 04/11/2018 1143   BILITOT 0.48 05/11/2017 0917   GFRNONAA >60 04/11/2018 1143   GFRAA >60 04/11/2018 1143    No results found for: SPEP, UPEP  Lab Results  Component Value Date   WBC 7.3 04/11/2018   NEUTROABS 4.9 04/11/2018   HGB 15.5 04/11/2018   HCT 46.3 04/11/2018   MCV 84.2 04/11/2018   PLT 187 04/11/2018      Chemistry      Component Value Date/Time   NA 142 04/11/2018 1143   NA 142 05/11/2017 0917   K 4.5 04/11/2018 1143   K 4.1 05/11/2017 0917   CL 104 04/11/2018 1143   CO2 28 04/11/2018 1143   CO2 26 05/11/2017 0917   BUN 15 04/11/2018 1143   BUN 14.2 05/11/2017 0917   CREATININE 1.14 04/11/2018 1143   CREATININE 0.9 05/11/2017 0917      Component Value Date/Time   CALCIUM 10.0 04/11/2018 1143   CALCIUM 9.6 05/11/2017 0917   ALKPHOS 96 04/11/2018 1143   ALKPHOS 114 05/11/2017 0917   AST 35 04/11/2018 1143   AST 53 (H) 05/11/2017 0917   ALT 34 04/11/2018 1143   ALT  61 (H) 05/11/2017 0917   BILITOT 0.8 04/11/2018 1143   BILITOT 0.48 05/11/2017 0917      All questions were answered. The patient knows to call the clinic with any problems, questions or  concerns. No barriers to learning was detected.  I spent 15 minutes counseling the patient face to face. The total time spent in the appointment was 20 minutes and more than 50% was on counseling and review of test results  Heath Lark, MD 04/12/2018 9:57 AM

## 2018-04-12 NOTE — Assessment & Plan Note (Signed)
He is not symptomatic Plan to recheck imaging study in 3 months after start of treatment

## 2018-04-12 NOTE — Assessment & Plan Note (Signed)
He tolerated treatment well except for headaches and hypertension I recommend holding his pill for 3 days and I will start him on antihypertensives I will call him next week to see how he is doing If his blood pressure improves, we will continue the same dose of treatment However, if his blood pressure remain high despite aggressive blood pressure management, we might have a reduce the dose of his treatment I recommend minimum 3 months of treatment before repeat imaging study I will see him back at the end of the month for further follow-up

## 2018-04-14 ENCOUNTER — Encounter: Payer: Self-pay | Admitting: Family Medicine

## 2018-04-14 ENCOUNTER — Ambulatory Visit: Payer: BLUE CROSS/BLUE SHIELD | Admitting: Family Medicine

## 2018-04-14 VITALS — BP 138/83 | HR 103 | Temp 98.2°F | Ht 72.0 in | Wt 205.0 lb

## 2018-04-14 DIAGNOSIS — F339 Major depressive disorder, recurrent, unspecified: Secondary | ICD-10-CM | POA: Diagnosis not present

## 2018-04-14 DIAGNOSIS — F0631 Mood disorder due to known physiological condition with depressive features: Secondary | ICD-10-CM | POA: Diagnosis not present

## 2018-04-14 MED ORDER — DIAZEPAM 5 MG PO TABS: 5.0000 mg | ORAL_TABLET | Freq: Three times a day (TID) | ORAL | 1 refills | Status: DC | PRN

## 2018-04-14 MED ORDER — DULOXETINE HCL 30 MG PO CPEP: 90.0000 mg | ORAL_CAPSULE | Freq: Every day | ORAL | 1 refills | Status: DC

## 2018-04-14 NOTE — Progress Notes (Signed)
BP 138/83   Pulse (!) 103   Temp 98.2 F (36.8 C) (Oral)   Ht 6' (1.829 m)   Wt 205 lb (93 kg)   BMI 27.80 kg/m    Subjective:    Patient ID: [name], male    DOB: December 31, 1975, 42 y.o.   MRN: [id number]  HPI: [name] is a 42 y.o. male presenting on 04/14/2018 for Depression (1 month follow up - Patient states thathe is feeling better.)   HPI Patient is coming in today for depression and anger recheck.  He has been having a lot of anger depression since he has been on chemo for recurrent neck cancer.  He has been doing better on the chemo is also been doing better on the medication.  He still says that he gets short sometimes at work and his anger comes out but not anywhere near what it was before and he denies any major episodes of depression or crying.  He denies any suicidal ideations.  He denies any feelings of hopelessness or helplessness currently and he feels like things are doing pretty good.  Depression screen [facility] 2/9 04/14/2018 03/10/2018 02/24/2018 02/10/2018 10/06/2016  Decreased Interest 1 2 3 3 3   Down, Depressed, Hopeless 1 1 3 3 3   PHQ - 2 Score 2 3 6 6 6   Altered sleeping 1 1 3 3 3   Tired, decreased energy 1 3 3 3 3   Change in appetite 1 2 0 3 3  Feeling bad or failure about yourself  1 2 3 3  (No Data)  Trouble concentrating 1 3 3 3  -  Moving slowly or fidgety/restless 2 3 0 3 -  Suicidal thoughts 0 1 - 3 -  PHQ-9 Score 9 18 18 27 15   Difficult doing work/chores - - - Extremely dIfficult -     Relevant past medical, surgical, family and social history reviewed and updated as indicated. Interim medical history since our last visit reviewed. Allergies and medications reviewed and updated.  Review of Systems  Constitutional: Negative for chills and fever.  Respiratory: Negative for shortness of breath and wheezing.   Cardiovascular: Negative for chest pain and leg swelling.  Musculoskeletal: Negative for back pain and gait problem.  Skin: Negative for  rash.  Psychiatric/Behavioral: Positive for dysphoric mood. Negative for agitation, behavioral problems, decreased concentration, self-injury, sleep disturbance and suicidal ideas. The patient is nervous/anxious.   All other systems reviewed and are negative.   Per HPI unless specifically indicated above   Allergies as of 04/14/2018      Reactions   Hydrocodone Itching      Medication List        Accurate as of 04/14/18 11:01 AM. Always use your most recent med list.          amLODipine 5 MG tablet Commonly known as:  NORVASC Take 1 tablet (5 mg total) by mouth daily.   cyclobenzaprine 10 MG tablet Commonly known as:  FLEXERIL Take 1 tablet (10 mg total) by mouth 3 (three) times daily as needed for muscle spasms.   diazepam 5 MG tablet Commonly known as:  VALIUM Take 1 tablet (5 mg total) by mouth every 8 (eight) hours as needed for anxiety.   DULoxetine 30 MG capsule Commonly known as:  CYMBALTA Take 3 capsules (90 mg total) by mouth daily.   lenvatinib 24 mg daily dose 2 x 10 MG & 4 MG capsule Commonly known as:  LENVIMA Take 24 mg by mouth  daily.   ondansetron 8 MG tablet Commonly known as:  ZOFRAN Take 8 mg by mouth every 8 (eight) hours as needed for nausea or vomiting.   PEPCID AC 10 MG tablet Generic drug:  famotidine Take 10 mg daily as needed by mouth for heartburn or indigestion.   prochlorperazine 10 MG tablet Commonly known as:  COMPAZINE Take 10 mg by mouth every 6 (six) hours as needed for nausea or vomiting.          Objective:    BP 138/83   Pulse (!) 103   Temp 98.2 F (36.8 C) (Oral)   Ht 6' (1.829 m)   Wt 205 lb (93 kg)   BMI 27.80 kg/m   Wt Readings from Last 3 Encounters:  04/14/18 205 lb (93 kg)  04/11/18 206 lb 6.4 oz (93.6 kg)  03/10/18 205 lb (93 kg)    Physical Exam  Constitutional: He is oriented to person, place, and time. He appears well-developed and well-nourished. No distress.  Eyes: Conjunctivae are normal. No  scleral icterus.  Neck: Neck supple. No thyromegaly present.  Cardiovascular: Normal rate, regular rhythm, normal heart sounds and intact distal pulses.  No murmur heard. Pulmonary/Chest: Effort normal and breath sounds normal. No respiratory distress. He has no wheezes.  Musculoskeletal: Normal range of motion. He exhibits no edema.  Lymphadenopathy:    He has no cervical adenopathy.  Neurological: He is alert and oriented to person, place, and time. Coordination normal.  Skin: Skin is warm and dry. No rash noted. He is not diaphoretic.  Psychiatric: His behavior is normal. His mood appears anxious. He exhibits a depressed mood. He expresses no suicidal ideation. He expresses no suicidal plans.  Nursing note and vitals reviewed.       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   Relevant Medications   diazepam (VALIUM) 5 MG tablet   DULoxetine (CYMBALTA) 30 MG capsule    Other Visit Diagnoses    Depression, recurrent ([place])       Associated with his cancer and life issues, has been suicidal at times but denies any plan today, will start Cymbalta back up   Relevant Medications   diazepam (VALIUM) 5 MG tablet   DULoxetine (CYMBALTA) 30 MG capsule   Depression, recurrent ([facility])       Relevant Medications   diazepam (VALIUM) 5 MG tablet   DULoxetine (CYMBALTA) 30 MG capsule    Continue with Cymbalta 90 and Valium  Follow up plan: Return in about 4 weeks (around 05/12/2018), or if symptoms worsen or fail to improve, for Depression recheck.  Counseling provided for all of the vaccine components No orders of the defined types were placed in this encounter.   Caryl Pina, MD [facility] 04/14/2018, 11:01 AM

## 2018-04-17 ENCOUNTER — Telehealth: Payer: Self-pay

## 2018-04-17 NOTE — Telephone Encounter (Signed)
Called with below message. Blood pressure 142/90 this morning and 121/81 last night. He said his blood pressure was lower over the weekend.

## 2018-04-17 NOTE — Telephone Encounter (Signed)
Called and given below message. He verbalized understanding. 

## 2018-04-17 NOTE — Telephone Encounter (Signed)
Very good Continue same BP meds and chemo

## 2018-04-17 NOTE — Telephone Encounter (Signed)
-----   Message from Heath Lark, MD sent at 04/17/2018  8:07 AM EST ----- Regarding: BP Can you call and ask how is his BP?

## 2018-04-19 ENCOUNTER — Telehealth: Payer: Self-pay | Admitting: Pharmacist

## 2018-04-19 ENCOUNTER — Telehealth: Payer: Self-pay

## 2018-04-19 NOTE — Telephone Encounter (Signed)
Oral Chemotherapy Pharmacist Encounter  Follow-Up Form  Spoke with patient today to follow up regarding patient's oral chemotherapy medication: Lenvima (lenvatinib) for the treatment of metastatic adenoid cystic carcinoma, s/p 3 separate excisions, planned durationuntil disease progression or unacceptable toxicity.  Original Start date of oral chemotherapy: 03/09/18  Pt is doing well today  Pt reports 0 tablets/doses of Lenvima 10mg  capsules x2 and Lenvima 4 capsules (24mg  total daily dose) by mouth once daily, with or without food, at approximately the same time each day, missed in the last month.   Pt reports the following side effects: patient denies side effects from [facility]  Pertinent labs reviewed: hypertension and headaches, MD aware and managing  Other Issues:   Patient currently has a bad cold, OK to take Alka-seltzer multisymptom relief.  Patient with recent insurance change. New insurance card located in [place]. Oral Oncology Clinic will run benefits investigation and follow-up with patient about continued medication acquisition (new prior authorization, new copayment due, new dispensing pharmacy)  Patient knows to call the office with questions or concerns. Oral Oncology Clinic will continue to follow.  Johny Drilling, PharmD, BCPS, BCOP  04/19/2018 2:48 PM Oral Oncology Clinic [phone]

## 2018-04-19 NOTE — Telephone Encounter (Signed)
Oral Oncology Patient Advocate Encounter  Prior Authorization for [name] has been approved.    PA# [id number] Effective dates: 04/19/18 through 04/18/19  Oral Oncology Clinic will continue to follow.   [place] Patient [place] Phone [phone] Fax [phone]

## 2018-04-19 NOTE — Telephone Encounter (Signed)
.  Oral Oncology Patient Advocate Encounter  Received notification from [facility] that prior authorization for [name] is required.  PA submitted on CoverMyMeds Key [id number] Status is pending  Oral Oncology Clinic will continue to follow.  [name] Phone [phone] Fax [phone]

## 2018-04-20 ENCOUNTER — Telehealth: Payer: Self-pay

## 2018-04-20 NOTE — Telephone Encounter (Signed)
Oral Oncology Patient Advocate Encounter  I was successful in securing a copay card for his lenvima to make his out of pocket cost $0. The information is as follows and has been shared with [place].  BIN: [id number] Group: [id number] ID: [id number]  I called the patient and gave him this information, he verbalized understanding and great appreciation  [place] Patient [place] Phone [phone] Fax [phone]

## 2018-05-05 MED FILL — LENVIMA 24 MG DAILY DOSE: 2 X 10 MG & | 15 days supply | Qty: 45 | Fill #0

## 2018-05-09 ENCOUNTER — Inpatient Hospital Stay (HOSPITAL_BASED_OUTPATIENT_CLINIC_OR_DEPARTMENT_OTHER): Payer: BLUE CROSS/BLUE SHIELD | Admitting: Hematology and Oncology

## 2018-05-09 ENCOUNTER — Encounter: Payer: Self-pay | Admitting: Hematology and Oncology

## 2018-05-09 ENCOUNTER — Inpatient Hospital Stay: Payer: BLUE CROSS/BLUE SHIELD

## 2018-05-09 ENCOUNTER — Telehealth: Payer: Self-pay | Admitting: Pharmacist

## 2018-05-09 ENCOUNTER — Telehealth: Payer: Self-pay | Admitting: Hematology and Oncology

## 2018-05-09 VITALS — BP 117/79 | HR 78 | Temp 97.7°F | Resp 18 | Ht 72.0 in | Wt 207.0 lb

## 2018-05-09 DIAGNOSIS — C7800 Secondary malignant neoplasm of unspecified lung: Secondary | ICD-10-CM

## 2018-05-09 DIAGNOSIS — C080 Malignant neoplasm of submandibular gland: Secondary | ICD-10-CM

## 2018-05-09 DIAGNOSIS — I10 Essential (primary) hypertension: Secondary | ICD-10-CM

## 2018-05-09 DIAGNOSIS — Z79899 Other long term (current) drug therapy: Secondary | ICD-10-CM

## 2018-05-09 LAB — CBC WITH DIFFERENTIAL/PLATELET
Abs Immature Granulocytes: 0.01 10*3/uL (ref 0.00–0.07)
BASOS PCT: 0 %
Basophils Absolute: 0 10*3/uL (ref 0.0–0.1)
Eosinophils Absolute: 0.1 10*3/uL (ref 0.0–0.5)
Eosinophils Relative: 2 %
HCT: 43 % (ref 39.0–52.0)
Hemoglobin: 14.3 g/dL (ref 13.0–17.0)
Immature Granulocytes: 0 %
Lymphocytes Relative: 25 %
Lymphs Abs: 1.4 10*3/uL (ref 0.7–4.0)
MCH: 28.4 pg (ref 26.0–34.0)
MCHC: 33.3 g/dL (ref 30.0–36.0)
MCV: 85.3 fL (ref 80.0–100.0)
MONOS PCT: 10 %
Monocytes Absolute: 0.5 10*3/uL (ref 0.1–1.0)
Neutro Abs: 3.3 10*3/uL (ref 1.7–7.7)
Neutrophils Relative %: 63 %
Platelets: 158 10*3/uL (ref 150–400)
RBC: 5.04 MIL/uL (ref 4.22–5.81)
RDW: 14.6 % (ref 11.5–15.5)
WBC: 5.3 10*3/uL (ref 4.0–10.5)
nRBC: 0 % (ref 0.0–0.2)

## 2018-05-09 LAB — COMPREHENSIVE METABOLIC PANEL
ALT: 24 U/L (ref 0–44)
AST: 27 U/L (ref 15–41)
Albumin: 4 g/dL (ref 3.5–5.0)
Alkaline Phosphatase: 90 U/L (ref 38–126)
Anion gap: 9 (ref 5–15)
BUN: 13 mg/dL (ref 6–20)
CHLORIDE: 103 mmol/L (ref 98–111)
CO2: 27 mmol/L (ref 22–32)
CREATININE: 1.01 mg/dL (ref 0.61–1.24)
Calcium: 9.8 mg/dL (ref 8.9–10.3)
GFR calc Af Amer: >60 mL/min (ref >60)
GFR calc non Af Amer: >60 mL/min (ref >60)
Glucose, Bld: 92 mg/dL (ref 70–99)
Potassium: 4.7 mmol/L (ref 3.5–5.1)
Sodium: 139 mmol/L (ref 135–145)
Total Bilirubin: 0.5 mg/dL (ref 0.3–1.2)
Total Protein: 7.4 g/dL (ref 6.5–8.1)

## 2018-05-09 NOTE — Progress Notes (Signed)
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 He has completed 2 months of treatment. I recommend repeat imaging study in the next month for objective response to therapy.  Metastasis to lung [facility]) He has occasional cough that could be related to recent upper respiratory tract infection rather than his pulmonary metastasis As above, I plan to repeat CT imaging next month  Essential hypertension His blood pressure control has improved with the addition of 5 mg amlodipine I recommend close monitoring We discussed potentially switching his blood pressure medicine to be taken in the morning instead of evening If his diastolic blood pressure is persistently elevated over 90, I recommend increasing the dose of amlodipine to 10 mg daily   Orders Placed This Encounter  Procedures  . CT CHEST W CONTRAST    Standing Status:   Future    Standing Expiration Date:   05/10/2019    Order Specific Question:   If indicated for the ordered procedure, I authorize the administration of contrast media per Radiology protocol    Answer:   Yes    Order Specific Question:   Preferred imaging location?    Answer:   [facility]    Order Specific Question:   Radiology Contrast Protocol - do NOT remove file path    Answer:   \\[email or web address]  . CT Soft Tissue Neck W Contrast    Standing Status:   Future    Standing Expiration Date:   05/09/2019    Order Specific Question:   If indicated for the ordered procedure, I authorize the administration of contrast media per Radiology protocol    Answer:   Yes    Order Specific Question:   Preferred imaging location?    Answer:   [facility]    Order Specific Question:   Radiology Contrast Protocol - do  NOT remove file path    Answer:   \\[email or web address]    INTERVAL HISTORY: Please see below for problem oriented charting. He returns for further follow-up He is feeling well He had recent upper respiratory tract infection with cough and fatigue, resolved spontaneously. No recent fever or chills.  He still have very mild cough but much improved. He complained of fatigue from treatment No new lesions in his submandibular region.  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 No acute intracranial abnormality. 2. Swollen right submandibular gland containing prominent calcifications consistent with submandibular gland stones. Submandibular gland enlargement is most likely inflammatory. Malignancy cannot be entirely excluded .    07/12/2014 Pathology Results    Submandibular gland, Right - ADENOID CYSTIC CARCINOMA, GRADE I OF III, SEE COMMENT. - POSITIVE FOR PERINEURAL INVASION. - TUMOR INVOLVES SURGICAL EXCISIONAL MARGIN - SEE TUMOR SYNOPTIC TEMPLATE BELOW. Microscopic Comment ONCOLOGY TABLE - SALIVARY GLAND 1. Specimen: Right submandibular gland 2. Procedure: Excision. 3. Tumor site and laterality: Right submandibular gland 4. Tumor focality: Unifocal 5. Maximum tumor size (cm): 3.8 cm 6. Histologic type: Adenoid cystic carcinoma. 7. Grade: I of III 8. Margins: Positive Distance of tumor from closest margin: Present at margin 9. Perineural  invasion: Present 10. Lymph-Vascular invasion: Absent. 11. Lymph nodes: # examined - 0; # positive - N/A; extracapsular extension - N/A 12. TNM code: pT2, pNX, pMX    07/12/2014 Surgery    Right submandibular gland excision     07/24/2014 Miscellaneous    The patient was seen by radiation oncologist locally who  offered him adjuvant radiation treatment.  The patient obtained second opinion at [facility] who recommended against adjuvant radiation treatment.  The patient finally decided not proceed with radiation therapy    07/29/2014 Imaging    No findings specific for metastatic disease in the chest or abdomen. 4 mm subpleural nodule in the medial left lower lobe, likely reflecting a benign subpleural lymph node, although technically indeterminate. Attention on follow-up is suggested.     07/30/2014 Imaging    MR face: Postop resection of right submandibular gland for adenoid cystic carcinoma. No evidence of perineural spread. No skullbase or cavernous sinus involvement.     08/12/2014 Surgery    Right level I, 2 neck selective dissection     08/12/2014 Pathology Results    Lymph nodes, radical neck dissection, limited right neck - BENIGN SALIVARY GLAND TISSUE; NEGATIVE FOR ATYPIA OR MALIGNANCY. - BENIGN SKIN WITH DERMAL AND SUBCUTANEOUS SCAR AND INFLAMMATORY TISSUE REACTION. - TWO INTRA-SALIVARY GLAND LYMPH NODES, NEGATIVE FOR TUMOR (0/2) - ONE LEVEL ONE-A LYMPH NODE, NEGATIVE FOR TUMOR (0/1). - SIX LEVEL TWO LYMPH NODES, NEGATIVE FOR TUMOR (0/6)    04/07/2015 Imaging    MR neck: 1. Status post resection of right submandibular gland. 2. No evidence for perineural spread or tumor recurrence. 3. Stable central and bilateral foraminal narrowing at C6-7.    09/25/2015 Imaging    MR neck: Stable exam. No evidence of residual or recurrent right submandibular carcinoma.    07/08/2016 Procedure    He underwent repeat excision of right submandibular mass: A 2 x 3 cm ellipse was made around mass incorporating the scar. This was carried down through skin and subcutaneous fat and the dissection was completed at the level of the sternomastoid muscle. Examination of the specimen revealed complete removal. This was sent for pathologic interpretation.         07/09/2016 Pathology Results     Adenoid cystic carcinoma (margins free of neoplasm)    08/06/2016 Imaging    CT scan of chest, abdomen and pelvis: 1. There are now multiple pulmonary nodules consistent with metastatic disease to the lungs. One nodule was present previously, measuring 4 mm, noted along the pleural margin of the left lower lobe. This is most likely a benign granuloma. The remaining visualized nodules are new consistent with metastatic disease, given the patient's history. 2. No other findings of metastatic disease within the chest. 3. No evidence of metastatic disease in the abdomen or pelvis. 4. No acute findings.    08/06/2016 Imaging    Ct neck: 1. Multiple new bilateral apical pulmonary nodules measuring up to 1.3 cm and concerning for metastatic disease. 2. 22 x 7 mm right level III lymph node, enlarged from 2016 and indeterminate. 3. Prior right submandibular gland resection without evidence of locally recurrent mass.    08/06/2016 Imaging    MR face: Prior right submandibular gland resection without evidence of recurrent tumor    10/12/2016 Imaging    There is no new mediastinal or hilar lymphadenopathy.  The heart is normal in size and there is no pericardial or pleural effusion.  Bilateral pulmonary nodules have slightly increased  in size, suspicious for metastatic disease. For instance a 14 mm nodule on image 39 previously measured 12 mm. The largest nodule in the right lower lobe measures 24 mm in diameter.  Limited imaging through the upper abdomen shows normal adrenal glands.    10/12/2016 Imaging    CT neck 1.No evidence for primary or nodal recurrence in the neck. 2.Bilateral pulmonary nodules.    02/16/2017 Imaging    Ct chest showed enlarging pulmonary metastases.    02/16/2017 Imaging    1. Unchanged examination of the neck with stable size of right level 3 and left level IIa lymph nodes. 2. Please see dedicated report for concomitant chest CT for findings below thoracic inlet.     03/25/2017 Procedure    Technically successful CT guided core needle core biopsy of indeterminate enlarging left lower lobe pulmonary nodule.    03/25/2017 Pathology Results    Lung, biopsy, Left lower lobe - FINDINGS CONSISTENT WITH METASTATIC ADENOID CYSTIC CARCINOMA, SEE COMMENT. Microscopic Comment There is a small focus of tumor with similar morphology to the patient's prior adenoid cystic carcinoma of the submandibular gland ([id number], reviewed).     04/18/2017 Imaging    Progressive bilateral pulmonary metastases, as above. Three subcentimeter hypervascular lesions in the right liver, unchanged from March 2018, indeterminate.    05/05/2017 Procedure    Successful placement of a right internal jugular approach power injectable Port-A-Cath. The catheter is ready for immediate use    05/16/2017 - 10/12/2017 Chemotherapy    The patient had chemotherapy with cisplatin, cytoxan and doxorubicin x 6 cycles    07/27/2017 Imaging    ECHO: LV EF: 55% -  60%    08/04/2017 Imaging    CT neck No locally recurrent tumor status post RIGHT submandibular gland excision for adenoid cystic carcinoma.  Previously identified regional lymph nodes are smaller and non worrisome.  Please see CT chest report for additional important findings.    08/04/2017 Imaging    CT chest 1. Stable pulmonary metastatic disease. No new or progressive findings are identified. 2. No mediastinal or hilar mass or adenopathy. 3. No new/acute pulmonary findings.    11/23/2017 Imaging    Pulmonary metastatic disease is stable.    11/30/2017 Procedure    Removal of implanted Port-A-Cath utilizing sharp and blunt dissection. The procedure was uncomplicated.    02/24/2018 Imaging    No significant interval change in the appearance of bilateral pulmonary metastatic disease. Most nodules are stable or minimally increased in size in the interval.    03/09/2018 -  Chemotherapy    The patient had Lenvima     REVIEW OF  SYSTEMS:   Constitutional: Denies fevers, chills or abnormal weight loss Eyes: Denies blurriness of vision Ears, nose, mouth, throat, and face: Denies mucositis or sore throat Respiratory: Denies cough, dyspnea or wheezes Cardiovascular: Denies palpitation, chest discomfort or lower extremity swelling Gastrointestinal:  Denies nausea, heartburn or change in bowel habits Skin: Denies abnormal skin rashes Lymphatics: Denies new lymphadenopathy or easy bruising Neurological:Denies numbness, tingling or new weaknesses Behavioral/Psych: Mood is stable, no new changes  All other systems were reviewed with the patient and are negative.  I have reviewed the past medical history, past surgical history, social history and family history with the patient and they are unchanged from previous note.  ALLERGIES:  is allergic to hydrocodone.  MEDICATIONS:  Current Outpatient Medications  Medication Sig Dispense Refill  . amLODipine (NORVASC) 5 MG tablet Take 1 tablet (5 mg total) by  mouth daily. 90 tablet 1  . cyclobenzaprine (FLEXERIL) 10 MG tablet Take 1 tablet (10 mg total) by mouth 3 (three) times daily as needed for muscle spasms. 90 tablet 1  . diazepam (VALIUM) 5 MG tablet Take 1 tablet (5 mg total) by mouth every 8 (eight) hours as needed for anxiety. 90 tablet 1  . DULoxetine (CYMBALTA) 30 MG capsule Take 3 capsules (90 mg total) by mouth daily. 90 capsule 1  . famotidine (PEPCID AC) 10 MG tablet Take 10 mg daily as needed by mouth for heartburn or indigestion.    Marland Kitchen lenvatinib 24 mg daily dose (LENVIMA) 2 x 10 MG & 4 MG capsule Take 24 mg by mouth daily. 90 capsule 11  . ondansetron (ZOFRAN) 8 MG tablet Take 8 mg by mouth every 8 (eight) hours as needed for nausea or vomiting.    . prochlorperazine (COMPAZINE) 10 MG tablet Take 10 mg by mouth every 6 (six) hours as needed for nausea or vomiting.     No current facility-administered medications for this visit.     PHYSICAL EXAMINATION: ECOG  PERFORMANCE STATUS: 1 - Symptomatic but completely ambulatory  Vitals:   05/09/18 0943  BP: 117/79  Pulse: 78  Resp: 18  Temp: 97.7 F (36.5 C)  SpO2: 96%   Filed Weights   05/09/18 0943  Weight: 207 lb (93.9 kg)    GENERAL:alert, no distress and comfortable SKIN: skin color, texture, turgor are normal, no rashes or significant lesions EYES: normal, Conjunctiva are pink and non-injected, sclera clear OROPHARYNX:no exudate, no erythema and lips, buccal mucosa, and tongue normal  NECK: supple, thyroid normal size, non-tender, without nodularity LYMPH:  no palpable lymphadenopathy in the cervical, axillary or inguinal LUNGS: clear to auscultation and percussion with normal breathing effort HEART: regular rate & rhythm and no murmurs and no lower extremity edema ABDOMEN:abdomen soft, non-tender and normal bowel sounds Musculoskeletal:no cyanosis of digits and no clubbing  NEURO: alert & oriented x 3 with fluent speech, no focal motor/sensory deficits  LABORATORY DATA:  I have reviewed the data as listed    Component Value Date/Time   NA 139 05/09/2018 0928   NA 142 05/11/2017 0917   K 4.7 05/09/2018 0928   K 4.1 05/11/2017 0917   CL 103 05/09/2018 0928   CO2 27 05/09/2018 0928   CO2 26 05/11/2017 0917   GLUCOSE 92 05/09/2018 0928   GLUCOSE 82 05/11/2017 0917   BUN 13 05/09/2018 0928   BUN 14.2 05/11/2017 0917   CREATININE 1.01 05/09/2018 0928   CREATININE 0.9 05/11/2017 0917   CALCIUM 9.8 05/09/2018 0928   CALCIUM 9.6 05/11/2017 0917   PROT 7.4 05/09/2018 0928   PROT 7.7 05/11/2017 0917   ALBUMIN 4.0 05/09/2018 0928   ALBUMIN 4.2 05/11/2017 0917   AST 27 05/09/2018 0928   AST 53 (H) 05/11/2017 0917   ALT 24 05/09/2018 0928   ALT 61 (H) 05/11/2017 0917   ALKPHOS 90 05/09/2018 0928   ALKPHOS 114 05/11/2017 0917   BILITOT 0.5 05/09/2018 0928   BILITOT 0.48 05/11/2017 0917   GFRNONAA >60 05/09/2018 0928   GFRAA >60 05/09/2018 0928    No results found for: SPEP,  UPEP  Lab Results  Component Value Date   WBC 5.3 05/09/2018   NEUTROABS 3.3 05/09/2018   HGB 14.3 05/09/2018   HCT 43.0 05/09/2018   MCV 85.3 05/09/2018   PLT 158 05/09/2018      Chemistry      Component Value Date/Time  NA 139 05/09/2018 0928   NA 142 05/11/2017 0917   K 4.7 05/09/2018 0928   K 4.1 05/11/2017 0917   CL 103 05/09/2018 0928   CO2 27 05/09/2018 0928   CO2 26 05/11/2017 0917   BUN 13 05/09/2018 0928   BUN 14.2 05/11/2017 0917   CREATININE 1.01 05/09/2018 0928   CREATININE 0.9 05/11/2017 0917      Component Value Date/Time   CALCIUM 9.8 05/09/2018 0928   CALCIUM 9.6 05/11/2017 0917   ALKPHOS 90 05/09/2018 0928   ALKPHOS 114 05/11/2017 0917   AST 27 05/09/2018 0928   AST 53 (H) 05/11/2017 0917   ALT 24 05/09/2018 0928   ALT 61 (H) 05/11/2017 0917   BILITOT 0.5 05/09/2018 0928   BILITOT 0.48 05/11/2017 0917      All questions were answered. The patient knows to call the clinic with any problems, questions or concerns. No barriers to learning was detected.  I spent 15 minutes counseling the patient face to face. The total time spent in the appointment was 20 minutes and more than 50% was on counseling and review of test results  Heath Lark, MD 05/09/2018 10:11 AM

## 2018-05-09 NOTE — Assessment & Plan Note (Signed)
He has occasional cough that could be related to recent upper respiratory tract infection rather than his pulmonary metastasis As above, I plan to repeat CT imaging next month

## 2018-05-09 NOTE — Telephone Encounter (Signed)
Gave avs and calendar ° °

## 2018-05-09 NOTE — Telephone Encounter (Signed)
Oral Oncology Pharmacist Encounter  Received call from patient with questions about next [place] fill. Patient had changed insurance carriers and will now be receiving his Lenvima from the [facility] instead of [facility].  Insurance authorization, with new insurance [employer], was approved on 04/19/2018. Oral oncology patient advocate secured manufacturer copayment coupon on 04/20/18 to reduce patient's out of pocket expense or Lenvima to $0.  Lenvima is ready at the [facility] for pick-up. Patient will go pick it up today.  He is receiving a 2 week supply at a time per insurance requirement for the next 90 days. Patient informed the pharmacy will be reaching out to him next week for his 1st refill call to ensure he does not have a break in therapy.  Johny Drilling, PharmD, BCPS, BCOP  05/09/2018 9:01 AM [facility] Oncology [facility] [phone]

## 2018-05-09 NOTE — Assessment & Plan Note (Signed)
Overall, he tolerated chemotherapy well except for hypertension. He has completed 2 months of treatment. I recommend repeat imaging study in the next month for objective response to therapy.

## 2018-05-09 NOTE — Assessment & Plan Note (Signed)
His blood pressure control has improved with the addition of 5 mg amlodipine I recommend close monitoring We discussed potentially switching his blood pressure medicine to be taken in the morning instead of evening If his diastolic blood pressure is persistently elevated over 90, I recommend increasing the dose of amlodipine to 10 mg daily

## 2018-05-22 MED FILL — LENVIMA 24 MG DAILY DOSE: 2 X 10 MG & | 15 days supply | Qty: 45 | Fill #1

## 2018-05-26 ENCOUNTER — Encounter: Payer: Self-pay | Admitting: Family Medicine

## 2018-05-26 ENCOUNTER — Ambulatory Visit: Payer: BLUE CROSS/BLUE SHIELD | Admitting: Family Medicine

## 2018-05-26 VITALS — BP 136/89 | HR 78 | Temp 97.0°F | Ht 73.0 in | Wt 202.6 lb

## 2018-05-26 DIAGNOSIS — F0631 Mood disorder due to known physiological condition with depressive features: Secondary | ICD-10-CM | POA: Diagnosis not present

## 2018-05-26 DIAGNOSIS — I10 Essential (primary) hypertension: Secondary | ICD-10-CM

## 2018-05-26 NOTE — Progress Notes (Signed)
BP 136/89   Pulse 78   Temp (!) 97 F (36.1 C) (Oral)   Ht 6\' 1"  (1.854 m)   Wt 202 lb 9.6 oz (91.9 kg)   BMI 26.73 kg/m    Subjective:    Patient ID: [name], male    DOB: Jun 10, 1975, 43 y.o.   MRN: [id number]  HPI: [name] is a 43 y.o. male presenting on 05/26/2018 for Depression (4 week re check )   HPI Depression Patient is coming in for depression recheck.  He is currently dealing with a lot of depression because of his chronic medical illness because he has head and neck cancer that has not been responding well to treatment.  He is using the Valium currently in the Cymbalta 90 and he says he is very happy with where he is at, even though his PHQ score is up a little bit he feels like things are going a lot better and he wants to stay with the medication where he is at.  He said over the past month he did have 1 day where he had to use multiple Valium but most the time he uses 1 or less a day. Depression screen [facility] 2/9 05/26/2018 04/14/2018 03/10/2018 02/24/2018 02/10/2018  Decreased Interest 1 1 2 3 3   Down, Depressed, Hopeless 2 1 1 3 3   PHQ - 2 Score 3 2 3 6 6   Altered sleeping 1 1 1 3 3   Tired, decreased energy 2 1 3 3 3   Change in appetite 2 1 2  0 3  Feeling bad or failure about yourself  1 1 2 3 3   Trouble concentrating 1 1 3 3 3   Moving slowly or fidgety/restless 1 2 3  0 3  Suicidal thoughts - 0 1 - 3  PHQ-9 Score 11 9 18 18 27   Difficult doing work/chores Not difficult at all - - - Extremely dIfficult    Hypertension Patient is currently on amlodipine, and their blood pressure today is 136/89. Patient denies any lightheadedness or dizziness. Patient denies headaches, blurred vision, chest pains, shortness of breath, or weakness. Denies any side effects from medication and is content with current medication.   Relevant past medical, surgical, family and social history reviewed and updated as indicated. Interim medical history since our last visit  reviewed. Allergies and medications reviewed and updated.  Review of Systems  Constitutional: Negative for chills and fever.  Eyes: Negative for visual disturbance.  Respiratory: Negative for shortness of breath and wheezing.   Cardiovascular: Negative for chest pain and leg swelling.  Musculoskeletal: Negative for back pain and gait problem.  Skin: Negative for rash.  Neurological: Negative for dizziness, weakness and light-headedness.  All other systems reviewed and are negative.   Per HPI unless specifically indicated above   Allergies as of 05/26/2018      Reactions   Hydrocodone Itching      Medication List       Accurate as of May 26, 2018  9:32 AM. Always use your most recent med list.        amLODipine 5 MG tablet Commonly known as:  NORVASC Take 1 tablet (5 mg total) by mouth daily.   cyclobenzaprine 10 MG tablet Commonly known as:  FLEXERIL Take 1 tablet (10 mg total) by mouth 3 (three) times daily as needed for muscle spasms.   diazepam 5 MG tablet Commonly known as:  VALIUM Take 1 tablet (5 mg total) by mouth every 8 (eight) hours  as needed for anxiety.   DULoxetine 30 MG capsule Commonly known as:  CYMBALTA Take 3 capsules (90 mg total) by mouth daily.   lenvatinib 24 mg daily dose 2 x 10 MG & 4 MG capsule Commonly known as:  LENVIMA Take 24 mg by mouth daily.   ondansetron 8 MG tablet Commonly known as:  ZOFRAN Take 8 mg by mouth every 8 (eight) hours as needed for nausea or vomiting.   PEPCID AC 10 MG tablet Generic drug:  famotidine Take 10 mg daily as needed by mouth for heartburn or indigestion.   prochlorperazine 10 MG tablet Commonly known as:  COMPAZINE Take 10 mg by mouth every 6 (six) hours as needed for nausea or vomiting.          Objective:    BP 136/89   Pulse 78   Temp (!) 97 F (36.1 C) (Oral)   Ht 6\' 1"  (1.854 m)   Wt 202 lb 9.6 oz (91.9 kg)   BMI 26.73 kg/m   Wt Readings from Last 3 Encounters:  05/26/18 202  lb 9.6 oz (91.9 kg)  05/09/18 207 lb (93.9 kg)  04/14/18 205 lb (93 kg)    Physical Exam Vitals signs and nursing note reviewed.  Constitutional:      General: He is not in acute distress.    Appearance: He is well-developed. He is not diaphoretic.  Eyes:     General: No scleral icterus.    Conjunctiva/sclera: Conjunctivae normal.  Neck:     Musculoskeletal: Neck supple.     Thyroid: No thyromegaly.  Cardiovascular:     Rate and Rhythm: Normal rate and regular rhythm.     Heart sounds: Normal heart sounds. No murmur.  Pulmonary:     Effort: Pulmonary effort is normal. No respiratory distress.     Breath sounds: Normal breath sounds. No wheezing.  Musculoskeletal: Normal range of motion.  Lymphadenopathy:     Cervical: No cervical adenopathy.  Skin:    General: Skin is warm and dry.     Findings: No rash.  Neurological:     Mental Status: He is alert and oriented to person, place, and time.     Coordination: Coordination normal.  Psychiatric:        Behavior: Behavior normal.       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  Counseling provided for all of the vaccine components No orders of the defined types were placed in this encounter.   Caryl Pina, MD [facility] 05/26/2018, 9:32 AM

## 2018-06-08 ENCOUNTER — Ambulatory Visit (HOSPITAL_COMMUNITY)
Admission: RE | Admit: 2018-06-08 | Discharge: 2018-06-08 | Disposition: A | Discharge status: Home / Self Care | Payer: BLUE CROSS/BLUE SHIELD | Source: Ambulatory Visit | Attending: Hematology and Oncology | Admitting: Hematology and Oncology

## 2018-06-08 ENCOUNTER — Other Ambulatory Visit: Payer: Self-pay | Admitting: Hematology and Oncology

## 2018-06-08 ENCOUNTER — Inpatient Hospital Stay: Payer: BLUE CROSS/BLUE SHIELD | Attending: Hematology and Oncology

## 2018-06-08 DIAGNOSIS — C7802 Secondary malignant neoplasm of left lung: Secondary | ICD-10-CM | POA: Insufficient documentation

## 2018-06-08 DIAGNOSIS — C080 Malignant neoplasm of submandibular gland: Secondary | ICD-10-CM

## 2018-06-08 DIAGNOSIS — R0789 Other chest pain: Secondary | ICD-10-CM | POA: Insufficient documentation

## 2018-06-08 DIAGNOSIS — C7801 Secondary malignant neoplasm of right lung: Secondary | ICD-10-CM | POA: Insufficient documentation

## 2018-06-08 DIAGNOSIS — E041 Nontoxic single thyroid nodule: Secondary | ICD-10-CM | POA: Diagnosis not present

## 2018-06-08 DIAGNOSIS — C7800 Secondary malignant neoplasm of unspecified lung: Secondary | ICD-10-CM

## 2018-06-08 DIAGNOSIS — I10 Essential (primary) hypertension: Secondary | ICD-10-CM | POA: Diagnosis not present

## 2018-06-08 LAB — COMPREHENSIVE METABOLIC PANEL
ALT: 25 U/L (ref 0–44)
AST: 35 U/L (ref 15–41)
Albumin: 4.2 g/dL (ref 3.5–5.0)
Alkaline Phosphatase: 84 U/L (ref 38–126)
Anion gap: 7 (ref 5–15)
BUN: 18 mg/dL (ref 6–20)
CO2: 28 mmol/L (ref 22–32)
Calcium: 9.4 mg/dL (ref 8.9–10.3)
Chloride: 104 mmol/L (ref 98–111)
Creatinine, Ser: 0.98 mg/dL (ref 0.61–1.24)
GFR calc Af Amer: >60 mL/min (ref >60)
GFR calc non Af Amer: >60 mL/min (ref >60)
Glucose, Bld: 87 mg/dL (ref 70–99)
Potassium: 4.5 mmol/L (ref 3.5–5.1)
SODIUM: 139 mmol/L (ref 135–145)
Total Bilirubin: 0.7 mg/dL (ref 0.3–1.2)
Total Protein: 7.3 g/dL (ref 6.5–8.1)

## 2018-06-08 LAB — CBC WITH DIFFERENTIAL/PLATELET
Abs Immature Granulocytes: 0.01 10*3/uL (ref 0.00–0.07)
Basophils Absolute: 0 10*3/uL (ref 0.0–0.1)
Basophils Relative: 0 %
Eosinophils Absolute: 0.1 10*3/uL (ref 0.0–0.5)
Eosinophils Relative: 2 %
HCT: 43.9 % (ref 39.0–52.0)
Hemoglobin: 14.9 g/dL (ref 13.0–17.0)
IMMATURE GRANULOCYTES: 0 %
Lymphocytes Relative: 17 %
Lymphs Abs: 1.3 10*3/uL (ref 0.7–4.0)
MCH: 29.2 pg (ref 26.0–34.0)
MCHC: 33.9 g/dL (ref 30.0–36.0)
MCV: 85.9 fL (ref 80.0–100.0)
Monocytes Absolute: 0.5 10*3/uL (ref 0.1–1.0)
Monocytes Relative: 7 %
Neutro Abs: 5.5 10*3/uL (ref 1.7–7.7)
Neutrophils Relative %: 74 %
Platelets: 171 10*3/uL (ref 150–400)
RBC: 5.11 MIL/uL (ref 4.22–5.81)
RDW: 14.7 % (ref 11.5–15.5)
WBC: 7.4 10*3/uL (ref 4.0–10.5)
nRBC: 0 % (ref 0.0–0.2)

## 2018-06-08 LAB — TOTAL PROTEIN, URINE DIPSTICK: Protein, ur: NEGATIVE mg/dL

## 2018-06-08 LAB — TSH: TSH: 1.978 u[IU]/mL (ref 0.320–4.118)

## 2018-06-08 MED ORDER — IOHEXOL 300 MG/ML  SOLN
100.0000 mL | Freq: Once | INTRAMUSCULAR | Status: AC | PRN
  Administered 2018-06-08: 100 mL via INTRAVENOUS

## 2018-06-08 MED ORDER — SODIUM CHLORIDE (PF) 0.9 % IJ SOLN
INTRAMUSCULAR | Status: AC
  Filled 2018-06-08: qty 50

## 2018-06-09 ENCOUNTER — Encounter: Payer: Self-pay | Admitting: Hematology and Oncology

## 2018-06-09 ENCOUNTER — Telehealth: Payer: Self-pay | Admitting: Hematology and Oncology

## 2018-06-09 ENCOUNTER — Inpatient Hospital Stay (HOSPITAL_BASED_OUTPATIENT_CLINIC_OR_DEPARTMENT_OTHER): Payer: BLUE CROSS/BLUE SHIELD | Admitting: Hematology and Oncology

## 2018-06-09 ENCOUNTER — Telehealth: Payer: Self-pay

## 2018-06-09 DIAGNOSIS — C7801 Secondary malignant neoplasm of right lung: Secondary | ICD-10-CM

## 2018-06-09 DIAGNOSIS — R0789 Other chest pain: Secondary | ICD-10-CM | POA: Diagnosis not present

## 2018-06-09 DIAGNOSIS — C7802 Secondary malignant neoplasm of left lung: Secondary | ICD-10-CM

## 2018-06-09 DIAGNOSIS — I10 Essential (primary) hypertension: Secondary | ICD-10-CM | POA: Diagnosis not present

## 2018-06-09 DIAGNOSIS — C7800 Secondary malignant neoplasm of unspecified lung: Secondary | ICD-10-CM

## 2018-06-09 DIAGNOSIS — C080 Malignant neoplasm of submandibular gland: Secondary | ICD-10-CM

## 2018-06-09 DIAGNOSIS — E041 Nontoxic single thyroid nodule: Secondary | ICD-10-CM | POA: Diagnosis not present

## 2018-06-09 NOTE — Assessment & Plan Note (Signed)
He has minor occasional cough but not severe.  He does not need any medication for this

## 2018-06-09 NOTE — Telephone Encounter (Signed)
[facility] and given below message.

## 2018-06-09 NOTE — Assessment & Plan Note (Signed)
He has mild chest wall discomfort but no specific lesion in his chest wall that could account for this.  Recommend over-the-counter analgesics only for now

## 2018-06-09 NOTE — Assessment & Plan Note (Signed)
I have reviewed imaging study with the patient Unfortunately, the patient has disease progression We will discontinue Lenvima The patient has multiple testing done in the past with no actionable targets I have looked at the [facility] website and recommended consideration for 2 different clinical trials.  I have given the patient information to review with his wife at home There is a clinical trial open using combination of nivolumab and ipilimumab in [place] He appears to be interested to pursue this but would like to think about it He will call me next week once he has made his final decision At this point in time, he is relatively asymptomatic.

## 2018-06-09 NOTE — Telephone Encounter (Signed)
-----   Message from Heath Lark, MD sent at 06/09/2018  1:14 PM EST ----- Regarding: stop Lenvima We are stopping it He said [facility] pharmacy has called him to pick up. Please call to cancel

## 2018-06-09 NOTE — Assessment & Plan Note (Signed)
His blood pressure control has been satisfactory I suspect his blood pressure will start to trend down upon discontinuation of Lenvima He will gradually taper himself off amlodipine

## 2018-06-09 NOTE — Progress Notes (Signed)
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  I have given the patient information to review with his wife at home There is a clinical trial open using combination of nivolumab and ipilimumab in [place] He appears to be interested to pursue this but would like to think about it He will call me next week once he has made his final decision At this point in time, he is relatively asymptomatic.  Metastasis to lung [facility]) He has minor occasional cough but not severe.  He does not need any medication for this  Chest wall discomfort He has mild chest wall discomfort but no specific lesion in his chest wall that could account for this.  Recommend over-the-counter analgesics only for now  Essential hypertension His blood pressure control has been satisfactory I suspect his blood pressure will start to trend down upon discontinuation of Lenvima He will gradually taper himself off amlodipine   No orders of the defined types were placed in this encounter.   INTERVAL HISTORY: Please see below for problem oriented charting. He returns today to review test results He has occasional cough, nonproductive and intermittent chest discomfort on the right but not severe His headache has resolved with amlodipine His blood pressure control at home has been satisfactory  SUMMARY OF ONCOLOGIC HISTORY: Oncology History   C kit patchy positivity, Her 2 neg, AR neg, EGFR neg, [employer] testing no actionable mutation, PDL-1 neg     Adenoid  cystic carcinoma of submandibular gland ([place])   02/19/2014 Imaging    Ct neck: 1. No acute intracranial abnormality. 2. Swollen right submandibular gland containing prominent calcifications consistent with submandibular gland stones. Submandibular gland enlargement is most likely inflammatory. Malignancy cannot be entirely excluded .    07/12/2014 Pathology Results    Submandibular gland, Right - ADENOID CYSTIC CARCINOMA, GRADE I OF III, SEE COMMENT. - POSITIVE FOR PERINEURAL INVASION. - TUMOR INVOLVES SURGICAL EXCISIONAL MARGIN - SEE TUMOR SYNOPTIC TEMPLATE BELOW. Microscopic Comment ONCOLOGY TABLE - SALIVARY GLAND 1. Specimen: Right submandibular gland 2. Procedure: Excision. 3. Tumor site and laterality: Right submandibular gland 4. Tumor focality: Unifocal 5. Maximum tumor size (cm): 3.8 cm 6. Histologic type: Adenoid cystic carcinoma. 7. Grade: I of III 8. Margins: Positive Distance of tumor from closest margin: Present at margin 9. Perineural invasion: Present 10. Lymph-Vascular invasion: Absent. 11. Lymph nodes: # examined - 0; # positive - N/A; extracapsular extension - N/A 12. TNM code: pT2, pNX, pMX    07/12/2014 Surgery    Right submandibular gland excision     07/24/2014 Miscellaneous    The patient was seen by radiation oncologist locally who offered him adjuvant radiation treatment.  The patient obtained second opinion at [facility] who recommended against adjuvant radiation treatment.  The patient finally decided not proceed with radiation therapy    07/29/2014 Imaging    No findings specific for metastatic disease in the chest or abdomen. 4 mm subpleural nodule in the medial left lower lobe, likely reflecting a benign subpleural lymph node, although technically indeterminate. Attention on follow-up is  suggested.     07/30/2014 Imaging    MR face: Postop resection of right submandibular gland for adenoid cystic carcinoma. No evidence of perineural  spread. No skullbase or cavernous sinus involvement.     08/12/2014 Surgery    Right level I, 2 neck selective dissection     08/12/2014 Pathology Results    Lymph nodes, radical neck dissection, limited right neck - BENIGN SALIVARY GLAND TISSUE; NEGATIVE FOR ATYPIA OR MALIGNANCY. - BENIGN SKIN WITH DERMAL AND SUBCUTANEOUS SCAR AND INFLAMMATORY TISSUE REACTION. - TWO INTRA-SALIVARY GLAND LYMPH NODES, NEGATIVE FOR TUMOR (0/2) - ONE LEVEL ONE-A LYMPH NODE, NEGATIVE FOR TUMOR (0/1). - SIX LEVEL TWO LYMPH NODES, NEGATIVE FOR TUMOR (0/6)    04/07/2015 Imaging    MR neck: 1. Status post resection of right submandibular gland. 2. No evidence for perineural spread or tumor recurrence. 3. Stable central and bilateral foraminal narrowing at C6-7.    09/25/2015 Imaging    MR neck: Stable exam. No evidence of residual or recurrent right submandibular carcinoma.    07/08/2016 Procedure    He underwent repeat excision of right submandibular mass: A 2 x 3 cm ellipse was made around mass incorporating the scar. This was carried down through skin and subcutaneous fat and the dissection was completed at the level of the sternomastoid muscle. Examination of the specimen revealed complete removal. This was sent for pathologic interpretation.         07/09/2016 Pathology Results    Adenoid cystic carcinoma (margins free of neoplasm)    08/06/2016 Imaging    CT scan of chest, abdomen and pelvis: 1. There are now multiple pulmonary nodules consistent with metastatic disease to the lungs. One nodule was present previously, measuring 4 mm, noted along the pleural margin of the left lower lobe. This is most likely a benign granuloma. The remaining visualized nodules are new consistent with metastatic disease, given the patient's history. 2. No other findings of metastatic disease within the chest. 3. No evidence of metastatic disease in the abdomen or pelvis. 4. No acute findings.    08/06/2016 Imaging    Ct neck: 1.  Multiple new bilateral apical pulmonary nodules measuring up to 1.3 cm and concerning for metastatic disease. 2. 22 x 7 mm right level III lymph node, enlarged from 2016 and indeterminate. 3. Prior right submandibular gland resection without evidence of locally recurrent mass.    08/06/2016 Imaging    MR face: Prior right submandibular gland resection without evidence of recurrent tumor    10/12/2016 Imaging    There is no new mediastinal or hilar lymphadenopathy.  The heart is normal in size and there is no pericardial or pleural effusion.  Bilateral pulmonary nodules have slightly increased in size, suspicious for metastatic disease. For instance a 14 mm nodule on image 39 previously measured 12 mm. The largest nodule in the right lower lobe measures 24 mm in diameter.  Limited imaging through the upper abdomen shows normal adrenal glands.    10/12/2016 Imaging    CT neck 1.No evidence for primary or nodal recurrence in the neck. 2.Bilateral pulmonary nodules.    02/16/2017 Imaging    Ct chest showed enlarging pulmonary metastases.    02/16/2017 Imaging    1. Unchanged examination of the neck with stable size of right level 3 and left level IIa lymph nodes. 2. Please see dedicated report for concomitant chest CT for findings below thoracic inlet.    03/25/2017 Procedure    Technically successful CT guided  core needle core biopsy of indeterminate enlarging left lower lobe pulmonary nodule.    03/25/2017 Pathology Results    Lung, biopsy, Left lower lobe - FINDINGS CONSISTENT WITH METASTATIC ADENOID CYSTIC CARCINOMA, SEE COMMENT. Microscopic Comment There is a small focus of tumor with similar morphology to the patient's prior adenoid cystic carcinoma of the submandibular gland ([id number], reviewed).     04/18/2017 Imaging    Progressive bilateral pulmonary metastases, as above. Three subcentimeter hypervascular lesions in the right liver, unchanged from March 2018,  indeterminate.    05/05/2017 Procedure    Successful placement of a right internal jugular approach power injectable Port-A-Cath. The catheter is ready for immediate use    05/16/2017 - 10/12/2017 Chemotherapy    The patient had chemotherapy with cisplatin, cytoxan and doxorubicin x 6 cycles    07/27/2017 Imaging    ECHO: LV EF: 55% -  60%    08/04/2017 Imaging    CT neck No locally recurrent tumor status post RIGHT submandibular gland excision for adenoid cystic carcinoma.  Previously identified regional lymph nodes are smaller and non worrisome.  Please see CT chest report for additional important findings.    08/04/2017 Imaging    CT chest 1. Stable pulmonary metastatic disease. No new or progressive findings are identified. 2. No mediastinal or hilar mass or adenopathy. 3. No new/acute pulmonary findings.    11/23/2017 Imaging    Pulmonary metastatic disease is stable.    11/30/2017 Procedure    Removal of implanted Port-A-Cath utilizing sharp and blunt dissection. The procedure was uncomplicated.    02/24/2018 Imaging    No significant interval change in the appearance of bilateral pulmonary metastatic disease. Most nodules are stable or minimally increased in size in the interval.    03/09/2018 - 06/09/2018 Chemotherapy    The patient had Lenvima    06/08/2018 Imaging    1. Bilateral pulmonary metastases have all mildly increased in size since 02/23/2018 CT. 2. Small hiatal hernia.     REVIEW OF SYSTEMS:   Constitutional: Denies fevers, chills or abnormal weight loss Eyes: Denies blurriness of vision Ears, nose, mouth, throat, and face: Denies mucositis or sore throat Cardiovascular: Denies palpitation, chest discomfort or lower extremity swelling Gastrointestinal:  Denies nausea, heartburn or change in bowel habits Skin: Denies abnormal skin rashes Lymphatics: Denies new lymphadenopathy or easy bruising Neurological:Denies numbness, tingling or new  weaknesses Behavioral/Psych: Mood is stable, no new changes  All other systems were reviewed with the patient and are negative.  I have reviewed the past medical history, past surgical history, social history and family history with the patient and they are unchanged from previous note.  ALLERGIES:  is allergic to hydrocodone.  MEDICATIONS:  Current Outpatient Medications  Medication Sig Dispense Refill  . amLODipine (NORVASC) 5 MG tablet Take 1 tablet (5 mg total) by mouth daily. 90 tablet 1  . cyclobenzaprine (FLEXERIL) 10 MG tablet Take 1 tablet (10 mg total) by mouth 3 (three) times daily as needed for muscle spasms. 90 tablet 1  . diazepam (VALIUM) 5 MG tablet Take 1 tablet (5 mg total) by mouth every 8 (eight) hours as needed for anxiety. 90 tablet 1  . DULoxetine (CYMBALTA) 30 MG capsule Take 3 capsules (90 mg total) by mouth daily. 90 capsule 1  . famotidine (PEPCID AC) 10 MG tablet Take 10 mg daily as needed by mouth for heartburn or indigestion.    . ondansetron (ZOFRAN) 8 MG tablet Take 8 mg by mouth every 8 (eight)  hours as needed for nausea or vomiting.    . prochlorperazine (COMPAZINE) 10 MG tablet Take 10 mg by mouth every 6 (six) hours as needed for nausea or vomiting.     No current facility-administered medications for this visit.     PHYSICAL EXAMINATION: ECOG PERFORMANCE STATUS: 1 - Symptomatic but completely ambulatory  Vitals:   06/09/18 1141  BP: 118/89  Pulse: 80  Resp: 18  Temp: 98.5 F (36.9 C)  SpO2: 95%   Filed Weights   06/09/18 1141  Weight: 204 lb 9.6 oz (92.8 kg)    GENERAL:alert, no distress and comfortable Musculoskeletal:no cyanosis of digits and no clubbing  NEURO: alert & oriented x 3 with fluent speech, no focal motor/sensory deficits  LABORATORY DATA:  I have reviewed the data as listed    Component Value Date/Time   NA 139 06/08/2018 1145   NA 142 05/11/2017 0917   K 4.5 06/08/2018 1145   K 4.1 05/11/2017 0917   CL 104  06/08/2018 1145   CO2 28 06/08/2018 1145   CO2 26 05/11/2017 0917   GLUCOSE 87 06/08/2018 1145   GLUCOSE 82 05/11/2017 0917   BUN 18 06/08/2018 1145   BUN 14.2 05/11/2017 0917   CREATININE 0.98 06/08/2018 1145   CREATININE 0.9 05/11/2017 0917   CALCIUM 9.4 06/08/2018 1145   CALCIUM 9.6 05/11/2017 0917   PROT 7.3 06/08/2018 1145   PROT 7.7 05/11/2017 0917   ALBUMIN 4.2 06/08/2018 1145   ALBUMIN 4.2 05/11/2017 0917   AST 35 06/08/2018 1145   AST 53 (H) 05/11/2017 0917   ALT 25 06/08/2018 1145   ALT 61 (H) 05/11/2017 0917   ALKPHOS 84 06/08/2018 1145   ALKPHOS 114 05/11/2017 0917   BILITOT 0.7 06/08/2018 1145   BILITOT 0.48 05/11/2017 0917   GFRNONAA >60 06/08/2018 1145   GFRAA >60 06/08/2018 1145    No results found for: SPEP, UPEP  Lab Results  Component Value Date   WBC 7.4 06/08/2018   NEUTROABS 5.5 06/08/2018   HGB 14.9 06/08/2018   HCT 43.9 06/08/2018   MCV 85.9 06/08/2018   PLT 171 06/08/2018      Chemistry      Component Value Date/Time   NA 139 06/08/2018 1145   NA 142 05/11/2017 0917   K 4.5 06/08/2018 1145   K 4.1 05/11/2017 0917   CL 104 06/08/2018 1145   CO2 28 06/08/2018 1145   CO2 26 05/11/2017 0917   BUN 18 06/08/2018 1145   BUN 14.2 05/11/2017 0917   CREATININE 0.98 06/08/2018 1145   CREATININE 0.9 05/11/2017 0917      Component Value Date/Time   CALCIUM 9.4 06/08/2018 1145   CALCIUM 9.6 05/11/2017 0917   ALKPHOS 84 06/08/2018 1145   ALKPHOS 114 05/11/2017 0917   AST 35 06/08/2018 1145   AST 53 (H) 05/11/2017 0917   ALT 25 06/08/2018 1145   ALT 61 (H) 05/11/2017 0917   BILITOT 0.7 06/08/2018 1145   BILITOT 0.48 05/11/2017 0917       RADIOGRAPHIC STUDIES: I have reviewed multiple imaging studies with the patient I have personally reviewed the radiological images as listed and agreed with the findings in the report. Ct Soft Tissue Neck W Contrast  Result Date: 06/08/2018 CLINICAL DATA:  Adenoid cystic carcinoma right submandibular  gland EXAM: CT NECK WITH CONTRAST TECHNIQUE: Multidetector CT imaging of the neck was performed using the standard protocol following the bolus administration of intravenous contrast. CONTRAST:  184m OMNIPAQUE IOHEXOL  300 MG/ML  SOLN COMPARISON:  CT neck 08/04/2017 FINDINGS: Pharynx and larynx: Normal. No mass or swelling. Salivary glands: Resection of right submandibular gland without recurrent tumor. Left submandibular gland normal.  Bilateral parotid gland normal. Thyroid: Fat containing nodule right thyroid is unchanged and appears benign. Normal thyroid size. Lymph nodes: No new or malignant adenopathy. Right posterior lymph node 5.5 mm unchanged. Additional small right-sided posterior lymph node unchanged. Left level 2 lymph nodes are smaller compared to the prior study. Vascular: Normal vascular enhancement. Limited intracranial: Negative Visualized orbits: Negative Mastoids and visualized paranasal sinuses: Mild mucosal edema in the paranasal sinuses bilaterally. Skeleton: No acute skeletal abnormality. Disc degeneration and spurring C6-7. Upper chest: CT chest from today reported separately Other: None IMPRESSION: Postop right parotidectomy. No recurrent tumor or adenopathy in the neck. Electronically Signed   By: Franchot Gallo M.D.   On: 06/08/2018 16:23   Ct Chest W Contrast  Result Date: 06/08/2018 CLINICAL DATA:  Metastatic submandibular gland carcinoma on chemotherapy. Restaging. EXAM: CT CHEST WITH CONTRAST TECHNIQUE: Multidetector CT imaging of the chest was performed during intravenous contrast administration. CONTRAST:  167m OMNIPAQUE IOHEXOL 300 MG/ML  SOLN COMPARISON:  02/23/2018 chest CT. FINDINGS: Cardiovascular: Normal heart size. No significant pericardial effusion/thickening. Great vessels are normal in course and caliber. No central pulmonary emboli. Mediastinum/Nodes: No discrete thyroid nodules. Unremarkable esophagus. No pathologically enlarged axillary, mediastinal or hilar lymph  nodes. Lungs/Pleura: No pneumothorax. No pleural effusion. No acute consolidative airspace disease. Numerous (at least 20) lung masses and pulmonary nodules scattered throughout both lungs have all mildly increased. Representative central left lower lobe 4.1 cm mass (series 5/image 100), increased from 3.8 cm. Central right lower lobe 4.5 cm mass (series 5/image 106), increased from 4.2 cm. Left upper lobe 2.2 cm nodule (series 5/image 40), increased from 1.9 cm. Apical left upper lobe 2.3 cm nodule (series 5/image 24), increased from 2.1 cm using similar measurement technique. Right upper lobe 2.6 cm nodule (series 5/image 47), increased from 2.3 cm. Upper abdomen: Small hiatal hernia. Musculoskeletal: No aggressive appearing focal osseous lesions. Mild thoracic spondylosis. IMPRESSION: 1. Bilateral pulmonary metastases have all mildly increased in size since 02/23/2018 CT. 2. Small hiatal hernia. Electronically Signed   By: JIlona SorrelM.D.   On: 06/08/2018 17:23    All questions were answered. The patient knows to call the clinic with any problems, questions or concerns. No barriers to learning was detected.  I spent 25 minutes counseling the patient face to face. The total time spent in the appointment was 30 minutes and more than 50% was on counseling and review of test results  NHeath Lark MD 06/09/2018 1:18 PM

## 2018-06-09 NOTE — Telephone Encounter (Signed)
No new orders

## 2018-06-12 ENCOUNTER — Telehealth: Payer: Self-pay

## 2018-06-12 ENCOUNTER — Encounter: Payer: Self-pay | Admitting: Family Medicine

## 2018-06-12 ENCOUNTER — Encounter: Payer: Self-pay | Admitting: Hematology and Oncology

## 2018-06-12 NOTE — Telephone Encounter (Signed)
Attempted to call referral. Went to voicemail. Left message and ask them to call office.

## 2018-06-13 NOTE — Telephone Encounter (Signed)
Called back regarding referral for clinical trial. Talked with [facility]. Faxed referral [phone]. They will call back after they review chart.

## 2018-06-14 ENCOUNTER — Telehealth: Payer: Self-pay

## 2018-06-14 NOTE — Telephone Encounter (Signed)
They called back. After reviewing the chart that cohort closed for approval in 2018.

## 2018-06-15 NOTE — Telephone Encounter (Signed)
Called and left below message. Ask her to call the office back. 

## 2018-06-15 NOTE — Telephone Encounter (Signed)
PLs call him Unfortunately the clinical trial is not available I recommend either 1) Go somewhere for second opinion: for example [facility] or [facility] 2) Observe only in 3 months with repeat CT and consider going back to previous chemo

## 2018-06-15 NOTE — Telephone Encounter (Signed)
Can you call back the study coordinator? Is it just closed on that location or the whole country?

## 2018-06-15 NOTE — Telephone Encounter (Signed)
Called and given below message. He stated understanding. He would like to have a second opinion. He will think about it and call back next week on the place he would like to go after talking with his wife. He would also like to observe for 3 months and follow up with Dr. Alvy Bimler and evaluate on the next step.

## 2018-06-15 NOTE — Telephone Encounter (Signed)
[name] called and left a message. The whole study is not closed, just the cohort for that type of cancer is closed and all sites for that type of cancer is closed.

## 2018-06-19 ENCOUNTER — Encounter: Payer: Self-pay | Admitting: Hematology and Oncology

## 2018-06-20 ENCOUNTER — Telehealth: Payer: Self-pay

## 2018-06-20 NOTE — Telephone Encounter (Signed)
Faxed referral to [phone] per patient request.

## 2018-06-30 ENCOUNTER — Ambulatory Visit: Payer: BLUE CROSS/BLUE SHIELD | Admitting: Family Medicine

## 2018-07-03 DIAGNOSIS — C7802 Secondary malignant neoplasm of left lung: Secondary | ICD-10-CM | POA: Diagnosis not present

## 2018-07-05 DIAGNOSIS — C080 Malignant neoplasm of submandibular gland: Secondary | ICD-10-CM | POA: Diagnosis not present

## 2018-07-11 DIAGNOSIS — C760 Malignant neoplasm of head, face and neck: Secondary | ICD-10-CM | POA: Diagnosis not present

## 2018-07-12 ENCOUNTER — Telehealth: Payer: Self-pay

## 2018-07-12 NOTE — Telephone Encounter (Signed)
Called with below message. He verbalized understanding. He is waiting on a test to come back to see if he qualifies for a clinical trial in [place], [place] for immunotherapy. If that one does not work out he is talking to a doctor at [facility].

## 2018-07-12 NOTE — Telephone Encounter (Signed)
-----   Message from Heath Lark, MD sent at 07/12/2018  7:32 AM EST ----- Regarding: how is he doing? Can you call and check on him?

## 2018-07-16 ENCOUNTER — Other Ambulatory Visit: Payer: Self-pay | Admitting: Family Medicine

## 2018-07-16 DIAGNOSIS — F339 Major depressive disorder, recurrent, unspecified: Secondary | ICD-10-CM

## 2018-07-25 ENCOUNTER — Encounter: Payer: Self-pay | Admitting: Hematology and Oncology

## 2018-07-27 ENCOUNTER — Encounter: Admit: 2018-07-27 | Discharge: 2018-07-28 | Payer: PRIVATE HEALTH INSURANCE

## 2018-07-27 ENCOUNTER — Encounter
Admit: 2018-07-27 | Discharge: 2018-07-27 | Payer: PRIVATE HEALTH INSURANCE | Attending: Hematology & Oncology | Primary: Hematology & Oncology

## 2018-07-27 ENCOUNTER — Ambulatory Visit: Admit: 2018-07-27 | Discharge: 2018-07-28 | Payer: PRIVATE HEALTH INSURANCE

## 2018-07-27 DIAGNOSIS — C801 Malignant (primary) neoplasm, unspecified: Principal | ICD-10-CM

## 2018-07-27 DIAGNOSIS — C7800 Secondary malignant neoplasm of unspecified lung: Secondary | ICD-10-CM | POA: Diagnosis not present

## 2018-07-27 DIAGNOSIS — R918 Other nonspecific abnormal finding of lung field: Secondary | ICD-10-CM | POA: Diagnosis not present

## 2018-07-31 ENCOUNTER — Encounter
Admit: 2018-07-31 | Discharge: 2018-08-01 | Payer: PRIVATE HEALTH INSURANCE | Attending: Hematology & Oncology | Primary: Hematology & Oncology

## 2018-07-31 DIAGNOSIS — R0602 Shortness of breath: Principal | ICD-10-CM

## 2018-07-31 DIAGNOSIS — C801 Malignant (primary) neoplasm, unspecified: Principal | ICD-10-CM

## 2018-07-31 DIAGNOSIS — Z79899 Other long term (current) drug therapy: Principal | ICD-10-CM

## 2018-07-31 DIAGNOSIS — Z9221 Personal history of antineoplastic chemotherapy: Principal | ICD-10-CM

## 2018-07-31 DIAGNOSIS — Z87891 Personal history of nicotine dependence: Principal | ICD-10-CM

## 2018-07-31 DIAGNOSIS — Z6828 Body mass index (BMI) 28.0-28.9, adult: Secondary | ICD-10-CM | POA: Diagnosis not present

## 2018-08-08 ENCOUNTER — Encounter: Admit: 2018-08-08 | Discharge: 2018-08-09 | Payer: PRIVATE HEALTH INSURANCE

## 2018-08-08 DIAGNOSIS — Z85858 Personal history of malignant neoplasm of other endocrine glands: Principal | ICD-10-CM

## 2018-08-08 DIAGNOSIS — J9859 Other diseases of mediastinum, not elsewhere classified: Principal | ICD-10-CM

## 2018-08-08 DIAGNOSIS — Z9221 Personal history of antineoplastic chemotherapy: Principal | ICD-10-CM

## 2018-08-08 DIAGNOSIS — F17290 Nicotine dependence, other tobacco product, uncomplicated: Principal | ICD-10-CM

## 2018-08-08 DIAGNOSIS — C7989 Secondary malignant neoplasm of other specified sites: Principal | ICD-10-CM

## 2018-08-08 DIAGNOSIS — Z886 Allergy status to analgesic agent status: Principal | ICD-10-CM

## 2018-08-08 DIAGNOSIS — F17220 Nicotine dependence, chewing tobacco, uncomplicated: Principal | ICD-10-CM

## 2018-08-08 DIAGNOSIS — C7802 Secondary malignant neoplasm of left lung: Principal | ICD-10-CM

## 2018-08-08 DIAGNOSIS — Z8589 Personal history of malignant neoplasm of other organs and systems: Principal | ICD-10-CM

## 2018-08-08 DIAGNOSIS — C801 Malignant (primary) neoplasm, unspecified: Principal | ICD-10-CM

## 2018-08-08 DIAGNOSIS — Z6827 Body mass index (BMI) 27.0-27.9, adult: Secondary | ICD-10-CM | POA: Diagnosis not present

## 2018-08-09 DIAGNOSIS — C7989 Secondary malignant neoplasm of other specified sites: Principal | ICD-10-CM

## 2018-08-11 ENCOUNTER — Encounter: Payer: Self-pay | Admitting: Family Medicine

## 2018-08-11 ENCOUNTER — Other Ambulatory Visit: Payer: Self-pay

## 2018-08-11 ENCOUNTER — Ambulatory Visit (INDEPENDENT_AMBULATORY_CARE_PROVIDER_SITE_OTHER): Payer: BLUE CROSS/BLUE SHIELD | Admitting: Family Medicine

## 2018-08-11 ENCOUNTER — Ambulatory Visit
Admit: 2018-08-11 | Discharge: 2018-08-12 | Disposition: A | Discharge status: Home / Self Care | Payer: PRIVATE HEALTH INSURANCE

## 2018-08-11 ENCOUNTER — Encounter
Admit: 2018-08-11 | Discharge: 2018-08-12 | Disposition: A | Discharge status: Home / Self Care | Payer: PRIVATE HEALTH INSURANCE

## 2018-08-11 DIAGNOSIS — C7989 Secondary malignant neoplasm of other specified sites: Principal | ICD-10-CM

## 2018-08-11 DIAGNOSIS — C801 Malignant (primary) neoplasm, unspecified: Secondary | ICD-10-CM | POA: Diagnosis not present

## 2018-08-11 DIAGNOSIS — Z48813 Encounter for surgical aftercare following surgery on the respiratory system: Secondary | ICD-10-CM | POA: Diagnosis not present

## 2018-08-11 DIAGNOSIS — R918 Other nonspecific abnormal finding of lung field: Secondary | ICD-10-CM | POA: Diagnosis not present

## 2018-08-11 DIAGNOSIS — C7802 Secondary malignant neoplasm of left lung: Secondary | ICD-10-CM | POA: Diagnosis not present

## 2018-08-11 DIAGNOSIS — Z85858 Personal history of malignant neoplasm of other endocrine glands: Secondary | ICD-10-CM | POA: Diagnosis not present

## 2018-08-11 DIAGNOSIS — Z85818 Personal history of malignant neoplasm of other sites of lip, oral cavity, and pharynx: Secondary | ICD-10-CM | POA: Diagnosis not present

## 2018-08-11 DIAGNOSIS — T797XXA Traumatic subcutaneous emphysema, initial encounter: Secondary | ICD-10-CM | POA: Diagnosis not present

## 2018-08-11 DIAGNOSIS — Z9221 Personal history of antineoplastic chemotherapy: Secondary | ICD-10-CM | POA: Diagnosis not present

## 2018-08-11 DIAGNOSIS — F17290 Nicotine dependence, other tobacco product, uncomplicated: Secondary | ICD-10-CM | POA: Diagnosis not present

## 2018-08-11 DIAGNOSIS — F17220 Nicotine dependence, chewing tobacco, uncomplicated: Secondary | ICD-10-CM | POA: Diagnosis not present

## 2018-08-11 DIAGNOSIS — C3490 Malignant neoplasm of unspecified part of unspecified bronchus or lung: Secondary | ICD-10-CM | POA: Diagnosis not present

## 2018-08-11 DIAGNOSIS — J9811 Atelectasis: Secondary | ICD-10-CM | POA: Diagnosis not present

## 2018-08-11 NOTE — Progress Notes (Signed)
Patient was in recovery from having just gotten out of surgery right now removing a tumor next to his heart, I spoke with his wife briefly but because is unable to speak to him we will reassess this visit sometime next week when he is feeling up to it. Caryl Pina, MD [facility] 08/11/2018, 1:23 PM

## 2018-08-12 DIAGNOSIS — C7989 Secondary malignant neoplasm of other specified sites: Principal | ICD-10-CM

## 2018-08-12 DIAGNOSIS — R918 Other nonspecific abnormal finding of lung field: Secondary | ICD-10-CM | POA: Diagnosis not present

## 2018-08-12 DIAGNOSIS — C7802 Secondary malignant neoplasm of left lung: Secondary | ICD-10-CM | POA: Diagnosis not present

## 2018-08-12 DIAGNOSIS — J9811 Atelectasis: Secondary | ICD-10-CM | POA: Diagnosis not present

## 2018-08-12 DIAGNOSIS — Z48813 Encounter for surgical aftercare following surgery on the respiratory system: Secondary | ICD-10-CM | POA: Diagnosis not present

## 2018-08-12 DIAGNOSIS — T797XXA Traumatic subcutaneous emphysema, initial encounter: Secondary | ICD-10-CM | POA: Diagnosis not present

## 2018-08-12 MED ORDER — DOCUSATE SODIUM 100 MG PO CAPS
100.00 | ORAL_CAPSULE | ORAL | Status: DC
Start: 2018-08-12 — End: 2018-08-12

## 2018-08-12 MED ORDER — GENERIC EXTERNAL MEDICATION
90.00 | Status: DC
Start: 2018-08-12 — End: 2018-08-12

## 2018-08-12 MED ORDER — ALBUTEROL SULFATE (2.5 MG/3ML) 0.083% IN NEBU
2.50 | INHALATION_SOLUTION | RESPIRATORY_TRACT | Status: DC
Start: 2018-08-12 — End: 2018-08-12

## 2018-08-12 MED ORDER — FAMOTIDINE 20 MG PO TABS
10.00 | ORAL_TABLET | ORAL | Status: DC
Start: 2018-08-12 — End: 2018-08-12

## 2018-08-12 MED ORDER — POLYETHYLENE GLYCOL 3350 17 G PO PACK
17.00 | PACK | ORAL | Status: DC
Start: 2018-08-13 — End: 2018-08-12

## 2018-08-12 MED ORDER — ACETAMINOPHEN 325 MG PO TABS
650.00 | ORAL_TABLET | ORAL | Status: DC
Start: 2018-08-12 — End: 2018-08-12

## 2018-08-12 MED ORDER — DIPHENHYDRAMINE HCL 25 MG PO CAPS
25.00 | ORAL_CAPSULE | ORAL | Status: DC
Start: ? — End: 2018-08-12

## 2018-08-12 MED ORDER — TRAMADOL HCL 50 MG PO TABS
50.00 | ORAL_TABLET | ORAL | Status: DC
Start: ? — End: 2018-08-12

## 2018-08-12 MED ORDER — GENERIC EXTERNAL MEDICATION
1.00 | Status: DC
Start: ? — End: 2018-08-12

## 2018-08-12 MED ORDER — LACTATED RINGERS IV SOLN
10.00 | INTRAVENOUS | Status: DC
Start: ? — End: 2018-08-12

## 2018-08-12 MED ORDER — PHENOL 1.4 % MT LIQD
2.00 | OROMUCOSAL | Status: DC
Start: ? — End: 2018-08-12

## 2018-08-12 MED ORDER — HEPARIN SODIUM (PORCINE) 5000 UNIT/ML IJ SOLN
5000.00 | INTRAMUSCULAR | Status: DC
Start: 2018-08-12 — End: 2018-08-12

## 2018-08-12 MED ORDER — GENERIC EXTERNAL MEDICATION
Status: DC
Start: ? — End: 2018-08-12

## 2018-08-12 MED ORDER — POLYETHYLENE GLYCOL 3350 17 GRAM ORAL POWDER PACKET
PACK | Freq: Every day | ORAL | 0 refills | 0 days | Status: CP
Start: 2018-08-12 — End: 2018-09-11

## 2018-08-12 MED ORDER — DULOXETINE 30 MG CAPSULE,DELAYED RELEASE
ORAL_CAPSULE | Freq: Every day | ORAL | 0 refills | 0.00000 days | Status: CP
Start: 2018-08-12 — End: 2018-09-11

## 2018-08-12 MED ORDER — TRAMADOL 50 MG TABLET
ORAL_TABLET | Freq: Four times a day (QID) | ORAL | 0 refills | 0 days | Status: CP | PRN
Start: 2018-08-12 — End: 2018-08-19

## 2018-08-12 MED ORDER — DOCUSATE SODIUM 100 MG CAPSULE
ORAL_CAPSULE | Freq: Every day | ORAL | 0 refills | 0.00000 days | Status: CP
Start: 2018-08-12 — End: 2018-09-11

## 2018-08-16 DIAGNOSIS — R911 Solitary pulmonary nodule: Principal | ICD-10-CM

## 2018-08-18 ENCOUNTER — Ambulatory Visit
Admit: 2018-08-18 | Discharge: 2018-08-19 | Disposition: A | Discharge status: Home / Self Care | Payer: PRIVATE HEALTH INSURANCE

## 2018-08-18 ENCOUNTER — Encounter
Admit: 2018-08-18 | Discharge: 2018-08-19 | Disposition: A | Discharge status: Home / Self Care | Payer: PRIVATE HEALTH INSURANCE | Attending: Student in an Organized Health Care Education/Training Program

## 2018-08-18 DIAGNOSIS — R911 Solitary pulmonary nodule: Principal | ICD-10-CM

## 2018-08-18 DIAGNOSIS — C7801 Secondary malignant neoplasm of right lung: Secondary | ICD-10-CM | POA: Diagnosis not present

## 2018-08-18 DIAGNOSIS — R918 Other nonspecific abnormal finding of lung field: Secondary | ICD-10-CM | POA: Diagnosis not present

## 2018-08-18 DIAGNOSIS — F17220 Nicotine dependence, chewing tobacco, uncomplicated: Secondary | ICD-10-CM | POA: Diagnosis not present

## 2018-08-18 DIAGNOSIS — C801 Malignant (primary) neoplasm, unspecified: Secondary | ICD-10-CM | POA: Diagnosis not present

## 2018-08-18 DIAGNOSIS — Z8589 Personal history of malignant neoplasm of other organs and systems: Secondary | ICD-10-CM | POA: Diagnosis not present

## 2018-08-18 DIAGNOSIS — Z4682 Encounter for fitting and adjustment of non-vascular catheter: Secondary | ICD-10-CM | POA: Diagnosis not present

## 2018-08-18 DIAGNOSIS — Z9221 Personal history of antineoplastic chemotherapy: Secondary | ICD-10-CM | POA: Diagnosis not present

## 2018-08-18 DIAGNOSIS — C7800 Secondary malignant neoplasm of unspecified lung: Secondary | ICD-10-CM | POA: Diagnosis not present

## 2018-08-18 DIAGNOSIS — R0989 Other specified symptoms and signs involving the circulatory and respiratory systems: Secondary | ICD-10-CM | POA: Diagnosis not present

## 2018-08-19 DIAGNOSIS — R911 Solitary pulmonary nodule: Principal | ICD-10-CM

## 2018-08-19 DIAGNOSIS — C7801 Secondary malignant neoplasm of right lung: Secondary | ICD-10-CM | POA: Diagnosis not present

## 2018-08-19 DIAGNOSIS — R918 Other nonspecific abnormal finding of lung field: Secondary | ICD-10-CM | POA: Diagnosis not present

## 2018-08-19 DIAGNOSIS — R0989 Other specified symptoms and signs involving the circulatory and respiratory systems: Secondary | ICD-10-CM | POA: Diagnosis not present

## 2018-08-19 DIAGNOSIS — Z4682 Encounter for fitting and adjustment of non-vascular catheter: Secondary | ICD-10-CM | POA: Diagnosis not present

## 2018-08-19 MED ORDER — IPRATROPIUM BROMIDE 0.02 % IN SOLN
500.00 | RESPIRATORY_TRACT | Status: DC
Start: 2018-08-19 — End: 2018-08-19

## 2018-08-19 MED ORDER — HEPARIN SODIUM (PORCINE) 5000 UNIT/ML IJ SOLN
5000.00 | INTRAMUSCULAR | Status: DC
Start: 2018-08-19 — End: 2018-08-19

## 2018-08-19 MED ORDER — ALBUTEROL SULFATE (2.5 MG/3ML) 0.083% IN NEBU
2.50 | INHALATION_SOLUTION | RESPIRATORY_TRACT | Status: DC
Start: ? — End: 2018-08-19

## 2018-08-19 MED ORDER — GENERIC EXTERNAL MEDICATION
90.00 | Status: DC
Start: 2018-08-20 — End: 2018-08-19

## 2018-08-19 MED ORDER — POLYETHYLENE GLYCOL 3350 17 G PO PACK
17.00 | PACK | ORAL | Status: DC
Start: 2018-08-20 — End: 2018-08-19

## 2018-08-19 MED ORDER — GENERIC EXTERNAL MEDICATION
Status: DC
Start: ? — End: 2018-08-19

## 2018-08-19 MED ORDER — DOCUSATE SODIUM 100 MG PO CAPS
100.00 | ORAL_CAPSULE | ORAL | Status: DC
Start: 2018-08-19 — End: 2018-08-19

## 2018-08-19 MED ORDER — TRAMADOL HCL 50 MG PO TABS
50.00 | ORAL_TABLET | ORAL | Status: DC
Start: ? — End: 2018-08-19

## 2018-08-19 MED ORDER — GENERIC EXTERNAL MEDICATION
1.00 | Status: DC
Start: 2018-08-19 — End: 2018-08-19

## 2018-08-19 MED ORDER — GENERIC EXTERNAL MEDICATION
12.50 | Status: DC
Start: ? — End: 2018-08-19

## 2018-08-19 MED ORDER — ONDANSETRON HCL 4 MG/2ML IJ SOLN
4.00 | INTRAMUSCULAR | Status: DC
Start: ? — End: 2018-08-19

## 2018-08-19 MED ORDER — SODIUM CHLORIDE 0.9 % IV SOLN
100.00 | INTRAVENOUS | Status: DC
Start: ? — End: 2018-08-19

## 2018-08-19 MED ORDER — IBUPROFEN 800 MG TABLET
ORAL_TABLET | Freq: Three times a day (TID) | ORAL | 0 refills | 0 days | Status: CP | PRN
Start: 2018-08-19 — End: 2018-08-26
  Filled 2018-08-19: qty 21, 7d supply, fill #0

## 2018-08-19 MED ORDER — OXYCODONE 5 MG TABLET
ORAL_TABLET | Freq: Four times a day (QID) | ORAL | 0 refills | 0 days | Status: CP | PRN
Start: 2018-08-19 — End: ?
  Filled 2018-08-19: qty 28, 7d supply, fill #0

## 2018-08-19 MED ORDER — ONDANSETRON 4 MG DISINTEGRATING TABLET
ORAL_TABLET | Freq: Three times a day (TID) | ORAL | 0 refills | 0 days | Status: CP | PRN
Start: 2018-08-19 — End: 2018-08-26
  Filled 2018-08-19: qty 21, 7d supply, fill #0

## 2018-08-19 MED FILL — ONDANSETRON 4 MG DISINTEGRATING TABLET: 7 days supply | Qty: 21 | Fill #0 | Status: AC

## 2018-08-19 MED FILL — OXYCODONE 5 MG TABLET: 7 days supply | Qty: 28 | Fill #0 | Status: AC

## 2018-08-19 MED FILL — IBUPROFEN 800 MG TABLET: 7 days supply | Qty: 21 | Fill #0 | Status: AC

## 2018-08-29 ENCOUNTER — Encounter: Admit: 2018-08-29 | Discharge: 2018-08-30 | Payer: PRIVATE HEALTH INSURANCE

## 2018-08-29 DIAGNOSIS — Z09 Encounter for follow-up examination after completed treatment for conditions other than malignant neoplasm: Principal | ICD-10-CM

## 2018-08-29 DIAGNOSIS — C801 Malignant (primary) neoplasm, unspecified: Principal | ICD-10-CM

## 2018-08-29 DIAGNOSIS — Z4802 Encounter for removal of sutures: Secondary | ICD-10-CM | POA: Diagnosis not present

## 2018-08-29 DIAGNOSIS — C7801 Secondary malignant neoplasm of right lung: Secondary | ICD-10-CM | POA: Diagnosis not present

## 2018-08-29 DIAGNOSIS — C7800 Secondary malignant neoplasm of unspecified lung: Secondary | ICD-10-CM | POA: Diagnosis not present

## 2018-08-29 DIAGNOSIS — Z8589 Personal history of malignant neoplasm of other organs and systems: Secondary | ICD-10-CM | POA: Diagnosis not present

## 2018-08-29 DIAGNOSIS — C7802 Secondary malignant neoplasm of left lung: Secondary | ICD-10-CM | POA: Diagnosis not present

## 2018-08-29 DIAGNOSIS — Z87891 Personal history of nicotine dependence: Secondary | ICD-10-CM | POA: Diagnosis not present

## 2018-09-08 ENCOUNTER — Encounter: Payer: Self-pay | Admitting: General Practice

## 2018-09-08 NOTE — Progress Notes (Signed)
[facility] Team contacted patient to assess for food insecurity and other psychosocial needs during current COVID19 pandemic.    Patient/family expressed no needs at this time.  Support Team member encouraged patient to call if changes occur or they have any other questions/concerns.    Beverely Pace, [place]

## 2018-09-26 ENCOUNTER — Encounter: Payer: Self-pay | Admitting: Family Medicine

## 2018-10-04 ENCOUNTER — Other Ambulatory Visit: Payer: Self-pay | Admitting: Family Medicine

## 2018-10-04 DIAGNOSIS — F339 Major depressive disorder, recurrent, unspecified: Secondary | ICD-10-CM

## 2018-10-05 ENCOUNTER — Other Ambulatory Visit: Payer: Self-pay

## 2018-10-06 ENCOUNTER — Encounter: Payer: Self-pay | Admitting: Family Medicine

## 2018-10-06 ENCOUNTER — Ambulatory Visit (HOSPITAL_COMMUNITY)
Admission: RE | Admit: 2018-10-06 | Discharge: 2018-10-06 | Disposition: A | Discharge status: Home / Self Care | Payer: BLUE CROSS/BLUE SHIELD | Source: Ambulatory Visit | Attending: Family Medicine | Admitting: Family Medicine

## 2018-10-06 ENCOUNTER — Other Ambulatory Visit: Payer: Self-pay

## 2018-10-06 ENCOUNTER — Other Ambulatory Visit: Payer: Self-pay | Admitting: Family Medicine

## 2018-10-06 ENCOUNTER — Ambulatory Visit: Payer: BLUE CROSS/BLUE SHIELD | Admitting: Family Medicine

## 2018-10-06 VITALS — BP 125/83 | HR 74 | Temp 98.2°F | Ht 73.0 in | Wt 202.4 lb

## 2018-10-06 DIAGNOSIS — F339 Major depressive disorder, recurrent, unspecified: Secondary | ICD-10-CM

## 2018-10-06 DIAGNOSIS — R569 Unspecified convulsions: Secondary | ICD-10-CM | POA: Insufficient documentation

## 2018-10-06 DIAGNOSIS — Z01810 Encounter for preprocedural cardiovascular examination: Secondary | ICD-10-CM | POA: Diagnosis not present

## 2018-10-06 MED ORDER — CYCLOBENZAPRINE HCL 10 MG PO TABS: 10.0000 mg | ORAL_TABLET | Freq: Three times a day (TID) | ORAL | 0 refills | Status: DC | PRN

## 2018-10-06 MED ORDER — DESVENLAFAXINE SUCCINATE ER 50 MG PO TB24: ORAL_TABLET | ORAL | 0 refills | Status: DC

## 2018-10-06 MED ORDER — DIAZEPAM 5 MG PO TABS: 5.0000 mg | ORAL_TABLET | Freq: Three times a day (TID) | ORAL | 1 refills | Status: DC | PRN

## 2018-10-06 NOTE — Progress Notes (Signed)
BP 125/83   Pulse 74   Temp 98.2 F (36.8 C) (Oral)   Ht _0  (1.854 m)   Wt 202 lb 6.4 oz (91.8 kg)   BMI 26.70 kg/m    Subjective:   Patient ID: [name], male    DOB: [birth year], 43 y.o.   MRN: [id number]  HPI: [name] is a 43 y.o. male presenting on 10/06/2018 for tremor all over (Patient states that it has happened 4 times in the last year - states for a min his body will start shaking all over ) and Depression (follow up)   HPI Depression recheck Patient has said that the Cymbalta did not seem to be working as well and he has been off of it for a week already, will transition to [place].  He says he still just has a very short trigger and flies off the handle easily.  Patient denies any suicidal ideations or thoughts of hurting himself.  He says his anxiousness is just not quite under control with everything and at work sometimes he still very anxious as well.  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  He denies any numbness or weakness in the once is done he is able to come to and go about work normal.  Most recently the last one he had was just this week.  He says he does not lose consciousness when they happen.  He denies any focal numbness or weakness more than he is already had from his chemotherapies before.  It lasted less than a minute or 2 and then it was gone.  His first 1 was in the fall of last year and that he has had 1 in the spring and 1 in the winter and then 1 just a week ago.  Relevant past medical, surgical, family and social history reviewed and updated as indicated. Interim medical history since our last visit reviewed. Allergies and  medications reviewed and updated.  Review of Systems  Constitutional: Negative for chills and fever.  Eyes: Negative for visual disturbance.  Respiratory: Negative for shortness of breath and wheezing.   Cardiovascular: Negative for chest pain and leg swelling.  Musculoskeletal: Negative for back pain and gait problem.  Skin: Negative for rash.  Neurological: Positive for tremors and seizures. Negative for dizziness and weakness.  Psychiatric/Behavioral: Positive for decreased concentration and dysphoric mood. Negative for self-injury, sleep disturbance and suicidal ideas. The patient is nervous/anxious.   All other systems reviewed and are negative.   Per HPI unless specifically indicated above   Allergies as of 10/06/2018      Reactions   Hydrocodone Itching      Medication List       Accurate as of Oct 06, 2018  9:00 AM. If you have any questions, ask your nurse or doctor.        STOP taking these medications   ondansetron 8 MG tablet Commonly known as:  ZOFRAN Stopped by:  Worthy Rancher, MD   prochlorperazine 10 MG tablet Commonly known as:  COMPAZINE Stopped by:  Worthy Rancher, MD     TAKE these medications   amLODipine 5  MG tablet Commonly known as:  NORVASC Take 1 tablet (5 mg total) by mouth daily.   cyclobenzaprine 10 MG tablet Commonly known as:  FLEXERIL TAKE 1 TABLET BY MOUTH THREE TIMES DAILY AS NEEDED FOR MUSCLE SPASMS   diazepam 5 MG tablet Commonly known as:  VALIUM Take 1 tablet (5 mg total) by mouth every 8 (eight) hours as needed for anxiety.   DULoxetine 30 MG capsule Commonly known as:  CYMBALTA Take 3 capsules (90 mg total) by mouth daily.   Pepcid AC 10 MG tablet Generic drug:  famotidine Take 10 mg daily as needed by mouth for heartburn or indigestion.        Objective:   BP 125/83   Pulse 74   Temp 98.2 F (36.8 C) (Oral)   Ht _0  (1.854 m)   Wt 202 lb 6.4 oz (91.8 kg)   BMI 26.70 kg/m   Wt Readings from  Last 3 Encounters:  10/06/18 202 lb 6.4 oz (91.8 kg)  06/09/18 204 lb 9.6 oz (92.8 kg)  05/26/18 202 lb 9.6 oz (91.9 kg)    Physical Exam Vitals signs and nursing note reviewed.  Constitutional:      General: He is not in acute distress.    Appearance: He is well-developed. He is not diaphoretic.  Eyes:     General: No scleral icterus.    Conjunctiva/sclera: Conjunctivae normal.  Neck:     Musculoskeletal: Neck supple.     Thyroid: No thyromegaly.  Cardiovascular:     Rate and Rhythm: Normal rate and regular rhythm.     Heart sounds: Normal heart sounds. No murmur.  Pulmonary:     Effort: Pulmonary effort is normal. No respiratory distress.     Breath sounds: Normal breath sounds. No wheezing.  Musculoskeletal: Normal range of motion.  Lymphadenopathy:     Cervical: No cervical adenopathy.  Skin:    General: Skin is warm and dry.     Findings: No rash.  Neurological:     General: No focal deficit present.     Mental Status: He is alert and oriented to person, place, and time. Mental status is at baseline.     Cranial Nerves: No cranial nerve deficit.     Sensory: No sensory deficit.     Motor: No weakness.     Coordination: Coordination normal.     Gait: Gait normal.  Psychiatric:        Behavior: Behavior normal.       Assessment & Plan:   Problem List Items Addressed This Visit    None    Visit Diagnoses    Seizure-like activity ([place])    -  Primary   Relevant Orders   MR Brain Wo Contrast   Ambulatory referral to Neurology   CBC with Differential/Platelet   CMP14+EGFR   TSH   Depression, recurrent ([place])       Associated with his cancer and life issues, has been suicidal at times but denies any plan today, will start Cymbalta back up   Relevant Medications   cyclobenzaprine (FLEXERIL) 10 MG tablet   desvenlafaxine (PRISTIQ) 50 MG 24 hr tablet   diazepam (VALIUM) 5 MG tablet   Depression, recurrent ([facility])       Relevant Medications   cyclobenzaprine  (FLEXERIL) 10 MG tablet   desvenlafaxine (PRISTIQ) 50 MG 24 hr tablet   diazepam (VALIUM) 5 MG tablet      Will start Pristiq and taper up to 100 mg and  see how he does with that, continue Valium  Possible seizure-like activity or partial seizure, will send to neurology and get an MRI brain stat and do some blood work today. Follow up plan: Return in about 4 weeks (around 11/03/2018), or if symptoms worsen or fail to improve, for Depression recheck.  Counseling provided for all of the vaccine components No orders of the defined types were placed in this encounter.   Caryl Pina, MD [facility] 10/06/2018, 9:00 AM

## 2018-10-07 LAB — CBC WITH DIFFERENTIAL/PLATELET
Basophils Absolute: 0 10*3/uL (ref 0.0–0.2)
Basos: 1 %
EOS (ABSOLUTE): 0.1 10*3/uL (ref 0.0–0.4)
Eos: 2 %
Hematocrit: 42.2 % (ref 37.5–51.0)
Hemoglobin: 14.6 g/dL (ref 13.0–17.7)
Immature Grans (Abs): 0 10*3/uL (ref 0.0–0.1)
Immature Granulocytes: 0 %
Lymphocytes Absolute: 1.6 10*3/uL (ref 0.7–3.1)
Lymphs: 28 %
MCH: 29.4 pg (ref 26.6–33.0)
MCHC: 34.6 g/dL (ref 31.5–35.7)
MCV: 85 fL (ref 79–97)
Monocytes Absolute: 0.6 10*3/uL (ref 0.1–0.9)
Monocytes: 11 %
Neutrophils Absolute: 3.4 10*3/uL (ref 1.4–7.0)
Neutrophils: 58 %
Platelets: 215 10*3/uL (ref 150–450)
RBC: 4.96 x10E6/uL (ref 4.14–5.80)
RDW: 13.1 % (ref 11.6–15.4)
WBC: 5.7 10*3/uL (ref 3.4–10.8)

## 2018-10-07 LAB — CMP14+EGFR
ALT: 25 IU/L (ref 0–44)
AST: 24 IU/L (ref 0–40)
Albumin/Globulin Ratio: 2 (ref 1.2–2.2)
Albumin: 4.6 g/dL (ref 4.0–5.0)
Alkaline Phosphatase: 88 IU/L (ref 39–117)
BUN/Creatinine Ratio: 16 (ref 9–20)
BUN: 16 mg/dL (ref 6–24)
Bilirubin Total: 0.6 mg/dL (ref 0.0–1.2)
CO2: 23 mmol/L (ref 20–29)
Calcium: 9.9 mg/dL (ref 8.7–10.2)
Chloride: 101 mmol/L (ref 96–106)
Creatinine, Ser: 0.99 mg/dL (ref 0.76–1.27)
GFR calc Af Amer: 107 mL/min/{1.73_m2} (ref >59)
GFR calc non Af Amer: 93 mL/min/{1.73_m2} (ref >59)
Globulin, Total: 2.3 g/dL (ref 1.5–4.5)
Glucose: 74 mg/dL (ref 65–99)
Potassium: 4.3 mmol/L (ref 3.5–5.2)
Sodium: 140 mmol/L (ref 134–144)
Total Protein: 6.9 g/dL (ref 6.0–8.5)

## 2018-10-07 LAB — TSH: TSH: 1.97 u[IU]/mL (ref 0.450–4.500)

## 2018-10-09 ENCOUNTER — Encounter: Payer: Self-pay | Admitting: Hematology and Oncology

## 2018-10-17 ENCOUNTER — Telehealth: Payer: Self-pay | Admitting: Diagnostic Neuroimaging

## 2018-10-17 NOTE — Telephone Encounter (Signed)
°  Due to current COVID 19 pandemic, our office is severely reducing in office visits until further notice, in order to minimize the risk to our patients and healthcare providers.    Called patient and scheduled a virtual visit with Dr. Leta Baptist for 6/17. Patient verbalized understanding of the [email or web address] process and I have sent an e-mail to [email or web address] with link and instructions as well as my name and our office number. Patient understands that they will receive a call from RN to update chart.   Pt understands that although there may be some limitations with this type of visit, we will take all precautions to reduce any security or privacy concerns.  Pt understands that this will be treated like an in office visit and we will file with pt's insurance, and there may be a patient responsible charge related to this service.

## 2018-10-24 ENCOUNTER — Telehealth: Payer: Self-pay

## 2018-10-24 NOTE — Telephone Encounter (Signed)
FYI, I received a phone call from [facility] about [name].  He was scheduled to see them tomorrow but he has cancelled his appointment and told them he didn't want to see anyone for seizure like activity, he "would let nature take it's course."

## 2018-10-24 NOTE — Telephone Encounter (Addendum)
Called patient to update EMR before video visit tomorrow. He stated he was going to call and cancel his appointment. He stated "I don't want to deal with it anymore. I'm just going to let life takes it's course." I was unable to convince him to reschedule, even in July to come into office. I advised he please call back if he changes his mind. He verbalized understanding, appreciation. Called PCP, LVM for referral coordinator advising her of above conversation with  patient who canceled his appointment with Dr Leta Baptist. Left number for any questions.

## 2018-10-25 ENCOUNTER — Ambulatory Visit: Payer: BLUE CROSS/BLUE SHIELD | Admitting: Diagnostic Neuroimaging

## 2018-10-25 NOTE — Telephone Encounter (Signed)
Left a message with the patient to hopefully call me back tomorrow

## 2018-11-17 ENCOUNTER — Other Ambulatory Visit: Payer: Self-pay | Admitting: Family Medicine

## 2018-11-17 DIAGNOSIS — F339 Major depressive disorder, recurrent, unspecified: Secondary | ICD-10-CM

## 2018-11-17 NOTE — Telephone Encounter (Signed)
Please advise.  Last seen 10/06/2018

## 2019-01-23 DIAGNOSIS — C3492 Malignant neoplasm of unspecified part of left bronchus or lung: Secondary | ICD-10-CM

## 2019-02-06 ENCOUNTER — Encounter
Admit: 2019-02-06 | Discharge: 2019-02-06 | Payer: PRIVATE HEALTH INSURANCE | Attending: Hematology & Oncology | Primary: Hematology & Oncology

## 2019-02-06 DIAGNOSIS — C3492 Malignant neoplasm of unspecified part of left bronchus or lung: Secondary | ICD-10-CM

## 2019-02-12 DIAGNOSIS — C3492 Malignant neoplasm of unspecified part of left bronchus or lung: Secondary | ICD-10-CM

## 2019-02-13 DIAGNOSIS — C801 Malignant (primary) neoplasm, unspecified: Secondary | ICD-10-CM | POA: Diagnosis not present

## 2019-02-13 DIAGNOSIS — C782 Secondary malignant neoplasm of pleura: Secondary | ICD-10-CM | POA: Diagnosis not present

## 2019-02-20 ENCOUNTER — Encounter
Admit: 2019-02-20 | Discharge: 2019-02-20 | Payer: PRIVATE HEALTH INSURANCE | Attending: Hematology & Oncology | Primary: Hematology & Oncology

## 2019-02-20 ENCOUNTER — Encounter: Admit: 2019-02-20 | Discharge: 2019-02-20 | Payer: PRIVATE HEALTH INSURANCE

## 2019-02-20 DIAGNOSIS — C3492 Malignant neoplasm of unspecified part of left bronchus or lung: Principal | ICD-10-CM

## 2019-02-20 DIAGNOSIS — C760 Malignant neoplasm of head, face and neck: Secondary | ICD-10-CM | POA: Diagnosis not present

## 2019-02-20 DIAGNOSIS — Z791 Long term (current) use of non-steroidal anti-inflammatories (NSAID): Secondary | ICD-10-CM | POA: Diagnosis not present

## 2019-02-20 DIAGNOSIS — Z6827 Body mass index (BMI) 27.0-27.9, adult: Secondary | ICD-10-CM | POA: Diagnosis not present

## 2019-02-20 DIAGNOSIS — Z87891 Personal history of nicotine dependence: Secondary | ICD-10-CM | POA: Diagnosis not present

## 2019-02-20 DIAGNOSIS — C7801 Secondary malignant neoplasm of right lung: Secondary | ICD-10-CM | POA: Diagnosis not present

## 2019-02-20 DIAGNOSIS — Z79899 Other long term (current) drug therapy: Secondary | ICD-10-CM | POA: Diagnosis not present

## 2019-02-27 DIAGNOSIS — C3492 Malignant neoplasm of unspecified part of left bronchus or lung: Secondary | ICD-10-CM | POA: Diagnosis not present

## 2019-03-07 DIAGNOSIS — C3492 Malignant neoplasm of unspecified part of left bronchus or lung: Secondary | ICD-10-CM | POA: Diagnosis not present

## 2019-03-15 DIAGNOSIS — C3492 Malignant neoplasm of unspecified part of left bronchus or lung: Principal | ICD-10-CM

## 2019-03-16 DIAGNOSIS — C3492 Malignant neoplasm of unspecified part of left bronchus or lung: Principal | ICD-10-CM

## 2019-07-28 IMAGING — CT CT CHEST W/ CM
2 of 3 series · 15 of 36 positions shown, 18 images · IV contrast (omnipaque)
Comparison: 11/23/2017

CLINICAL DATA: Adenoid cystic carcinoma of the submandibular glands
with metastatic disease to the lungs. Status post chemotherapy
assess response to therapy.

EXAM:
CT CHEST WITH CONTRAST
TECHNIQUE: Multidetector CT imaging of the chest was performed during
intravenous contrast administration.
CONTRAST:  75mL OMNIPAQUE IOHEXOL 300 MG/ML  SOLN

[Series 2: axial st · axial · 0.78mm/px · z∈[-412,-130]mm · 12 of 167 slices shown, 15 images]
[im 13/167  mediastinal]
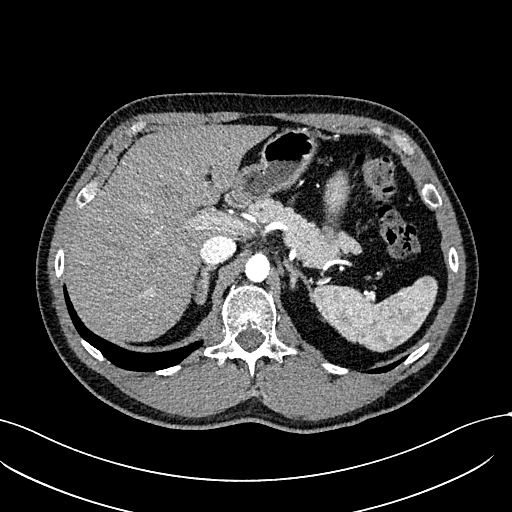
[im 13/167  lung]
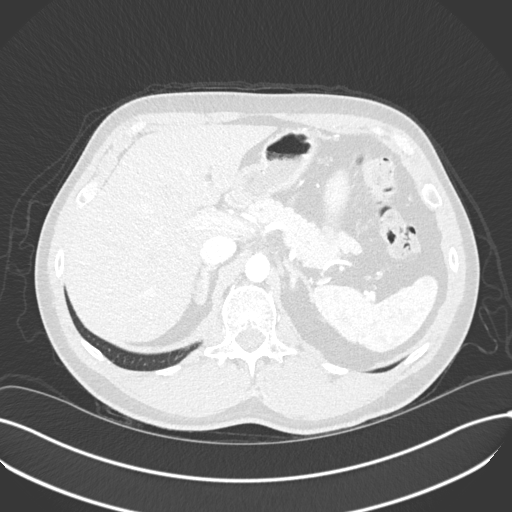
[im 25/167  lung]
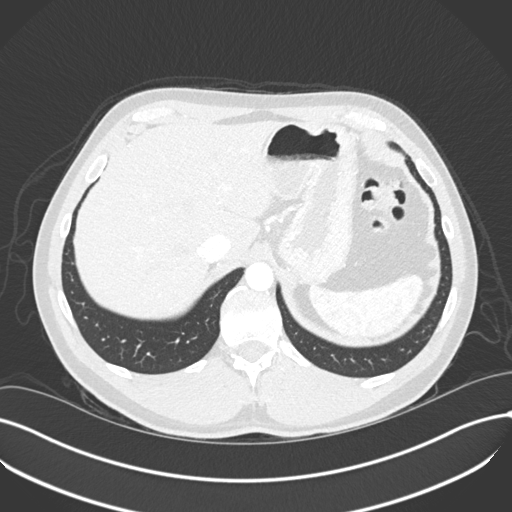
[im 37/167  lung]
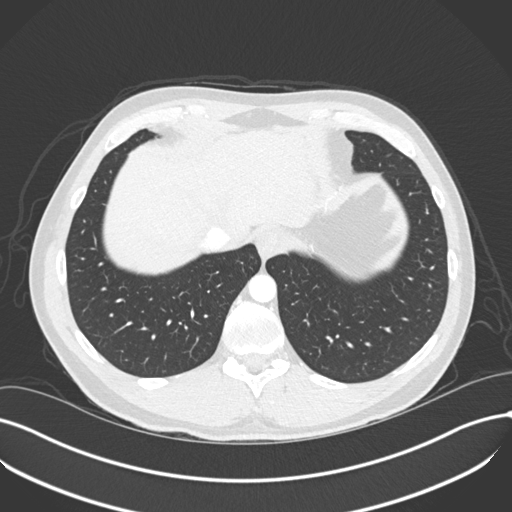
[im 50/167  lung]
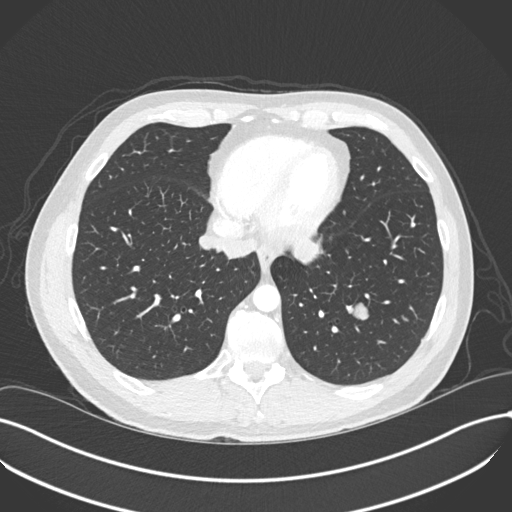
[im 62/167  mediastinal]
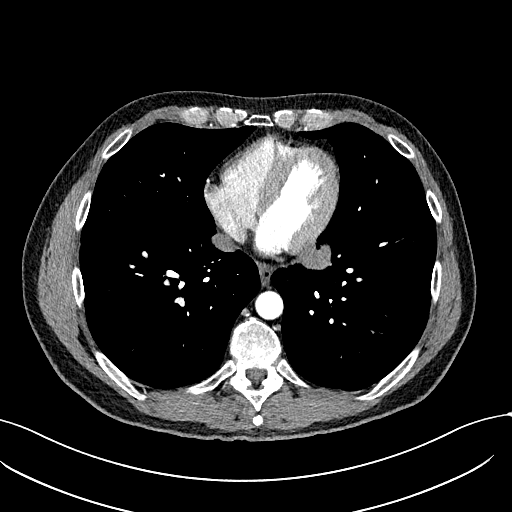
[im 62/167  lung]
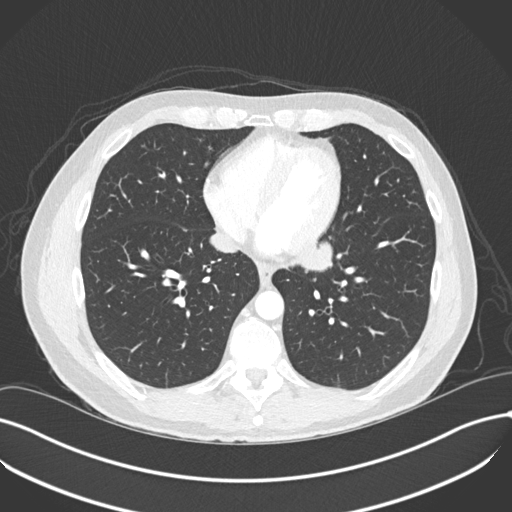
[im 74/167  lung]
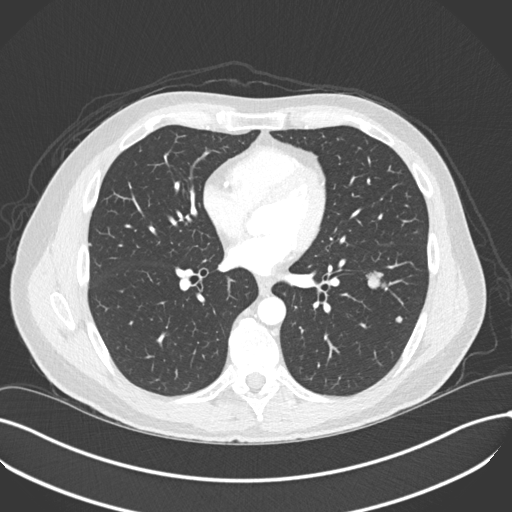
[im 93/167  lung]
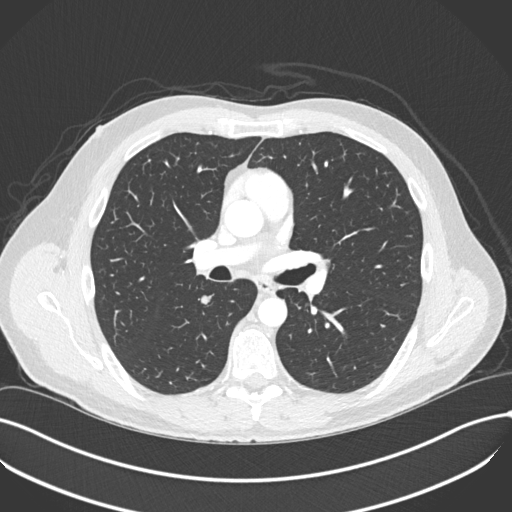
[im 105/167  lung]
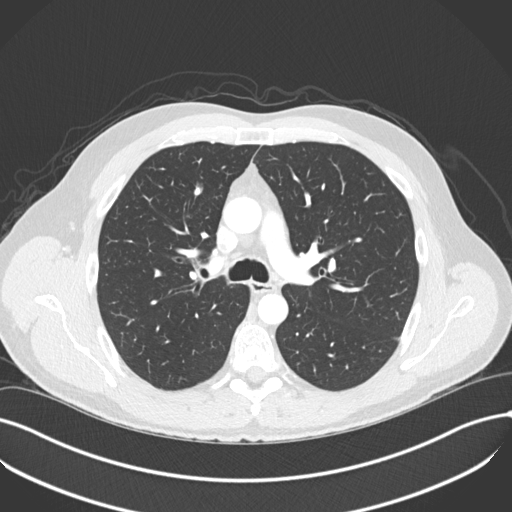
[im 117/167  mediastinal]
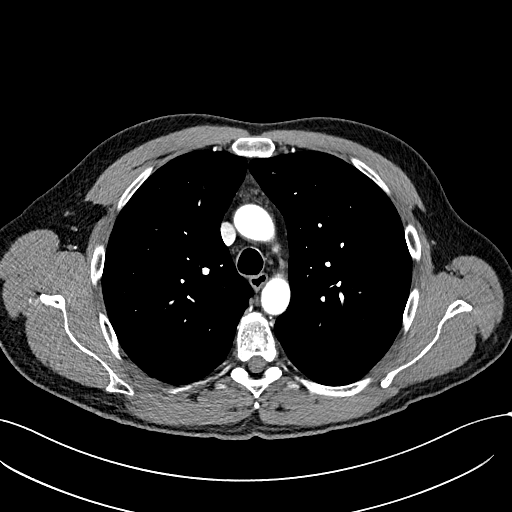
[im 117/167  lung]
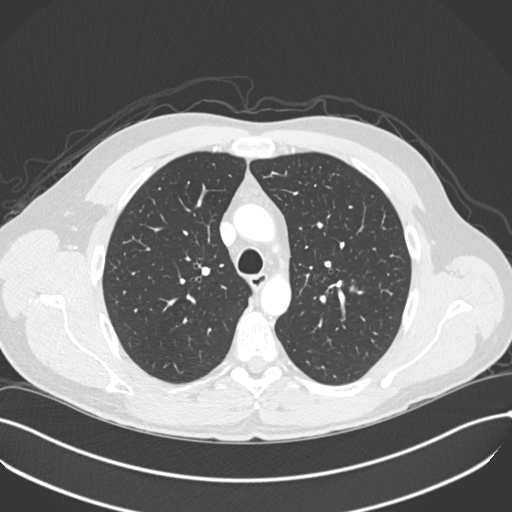
[im 130/167  lung]
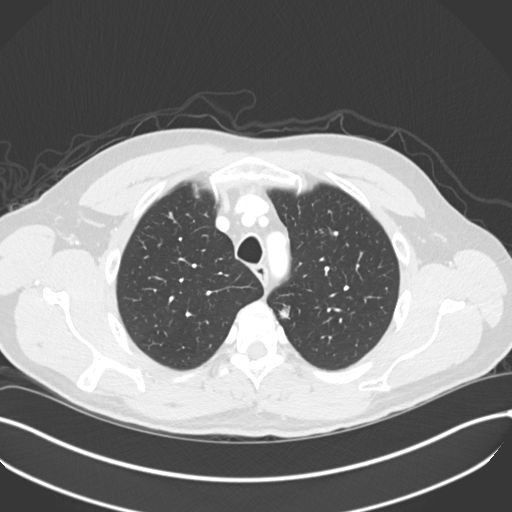
[im 142/167  lung]
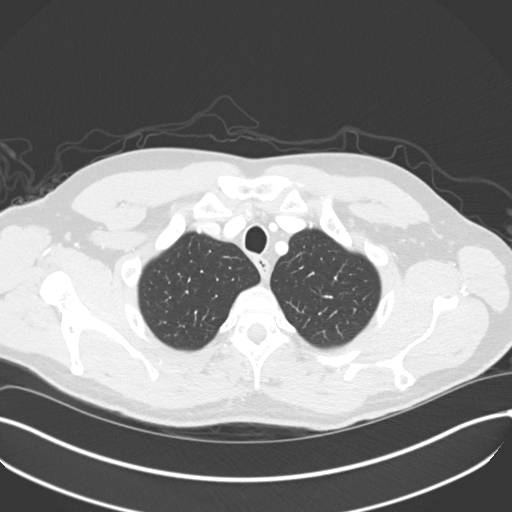
[im 154/167  lung]
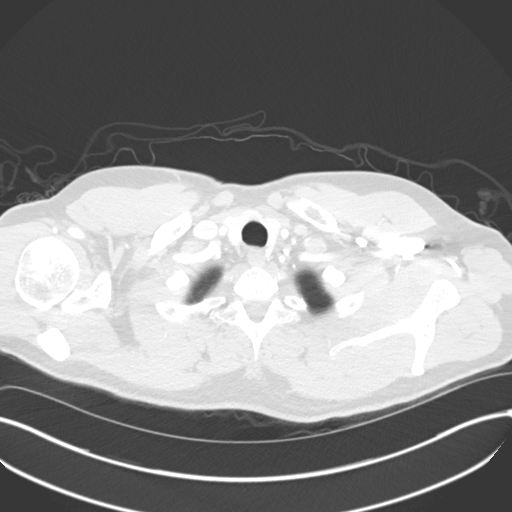

[Series 6: coronal · coronal · 0.69mm/px · 3 of 133 slices shown]
[im 27/133  lung]
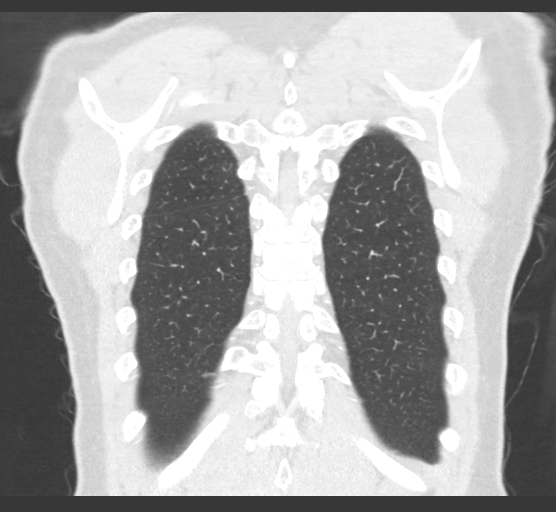
[im 53/133  lung]
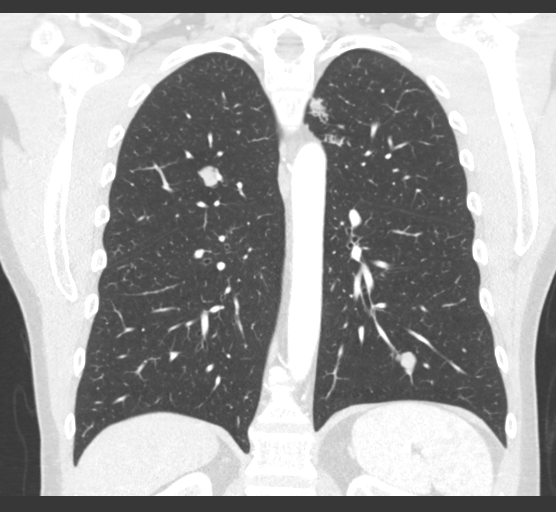
[im 80/133  lung]
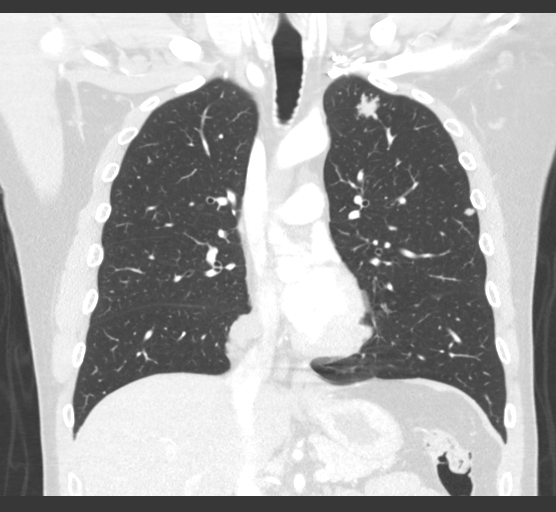

[15 of 36 positions shown; findings below may reference images not displayed]

FINDINGS: Cardiovascular: The heart size appears within normal limits. No
pericardial effusion identified.

Mediastinum/Nodes: Normal appearance of the thyroid gland. The
trachea appears patent and is midline. Normal appearance of the
esophagus. No mediastinal, supraclavicular, or axillary adenopathy.
No hilar adenopathy.

Lungs/Pleura: There is no pleural effusion, airspace consolidation
or atelectasis. No pneumothorax. Multiple pulmonary nodules are
again noted compatible with metastatic disease. A index lesion
within the left upper lobe measures 1.8 cm, image 32/5. Index lesion
within the left upper lobe measures 1.9 cm, image 47/5. Unchanged.
The right upper lobe lung lesion measures 2.2 cm, image 59/5.
Previously 2.0 cm. Subpleural lesion within the medial right lower
lobe measures 4.2 cm, image 114/5. Previously 3.9 cm. Index lesion
in the paramediastinal left lower lobe measures 3.8 cm, image 110/5.
Previously 3.3 cm.

Upper Abdomen: No acute abdominal abnormality. Similar appearance of
peripherally enhancing lesion within right lobe of liver measuring 1
cm, image 164/2.

Musculoskeletal: No chest wall abnormality. No acute or significant
osseous findings.
IMPRESSION: 1. No significant interval change in the appearance of bilateral
pulmonary metastatic disease. Most nodules are stable or minimally
increased in size in the interval.

## 2019-09-13 ENCOUNTER — Encounter (HOSPITAL_COMMUNITY): Payer: Self-pay

## 2019-09-13 ENCOUNTER — Emergency Department (HOSPITAL_COMMUNITY): Payer: BLUE CROSS/BLUE SHIELD

## 2019-09-13 ENCOUNTER — Emergency Department (HOSPITAL_COMMUNITY)
Admission: EM | Admit: 2019-09-13 | Discharge: 2019-09-13 | Disposition: A | Discharge status: Home / Self Care | Payer: BLUE CROSS/BLUE SHIELD | Attending: Emergency Medicine | Admitting: Emergency Medicine

## 2019-09-13 ENCOUNTER — Other Ambulatory Visit: Payer: Self-pay

## 2019-09-13 DIAGNOSIS — Z79899 Other long term (current) drug therapy: Secondary | ICD-10-CM | POA: Diagnosis not present

## 2019-09-13 DIAGNOSIS — R0789 Other chest pain: Secondary | ICD-10-CM | POA: Diagnosis not present

## 2019-09-13 DIAGNOSIS — Z87891 Personal history of nicotine dependence: Secondary | ICD-10-CM | POA: Insufficient documentation

## 2019-09-13 DIAGNOSIS — R918 Other nonspecific abnormal finding of lung field: Secondary | ICD-10-CM | POA: Diagnosis not present

## 2019-09-13 DIAGNOSIS — I10 Essential (primary) hypertension: Secondary | ICD-10-CM | POA: Insufficient documentation

## 2019-09-13 DIAGNOSIS — R079 Chest pain, unspecified: Secondary | ICD-10-CM | POA: Diagnosis not present

## 2019-09-13 LAB — CBC WITH DIFFERENTIAL/PLATELET
Abs Immature Granulocytes: 0.01 10*3/uL (ref 0.00–0.07)
Basophils Absolute: 0 10*3/uL (ref 0.0–0.1)
Basophils Relative: 0 %
Eosinophils Absolute: 0 10*3/uL (ref 0.0–0.5)
Eosinophils Relative: 0 %
HCT: 40.7 % (ref 39.0–52.0)
Hemoglobin: 13.3 g/dL (ref 13.0–17.0)
Immature Granulocytes: 0 %
Lymphocytes Relative: 26 %
Lymphs Abs: 1.7 10*3/uL (ref 0.7–4.0)
MCH: 27.3 pg (ref 26.0–34.0)
MCHC: 32.7 g/dL (ref 30.0–36.0)
MCV: 83.6 fL (ref 80.0–100.0)
Monocytes Absolute: 0.5 10*3/uL (ref 0.1–1.0)
Monocytes Relative: 8 %
Neutro Abs: 4.2 10*3/uL (ref 1.7–7.7)
Neutrophils Relative %: 66 %
Platelets: 256 10*3/uL (ref 150–400)
RBC: 4.87 MIL/uL (ref 4.22–5.81)
RDW: 13.6 % (ref 11.5–15.5)
WBC: 6.4 10*3/uL (ref 4.0–10.5)
nRBC: 0 % (ref 0.0–0.2)

## 2019-09-13 LAB — BASIC METABOLIC PANEL
Anion gap: 9 (ref 5–15)
BUN: 14 mg/dL (ref 6–20)
CO2: 26 mmol/L (ref 22–32)
Calcium: 9.3 mg/dL (ref 8.9–10.3)
Chloride: 104 mmol/L (ref 98–111)
Creatinine, Ser: 0.83 mg/dL (ref 0.61–1.24)
GFR calc Af Amer: >60 mL/min (ref >60)
GFR calc non Af Amer: >60 mL/min (ref >60)
Glucose, Bld: 98 mg/dL (ref 70–99)
Potassium: 3.8 mmol/L (ref 3.5–5.1)
Sodium: 139 mmol/L (ref 135–145)

## 2019-09-13 LAB — TROPONIN I (HIGH SENSITIVITY): Troponin I (High Sensitivity): 2 ng/L (ref <18)

## 2019-09-13 NOTE — ED Provider Notes (Signed)
[facility] EMERGENCY DEPARTMENT Provider Note   CSN: [id number] Arrival date & time: 09/13/19  6283     History Chief Complaint  Patient presents with  . Chest Pain    Tkai Serfass Giraud is a 44 y.o. male.  HPI      MAJESTY OEHLERT is a 44 y.o. male with hx of cystic adenoid carcinoma diagnosed in 2015  who presents to the Emergency Department complaining of constant focal left-sided chest pain.  Pain is been worse for 2 weeks.  He reports similar pain since a thorascopic resection in 08/2018 for mass found along his pericardium at [facility].  He describes a pain along the lateral to anterior left chest wall.  Pain is reproducible with palpation, movement, and deep breathing.  Pain slightly subsides at rest.  Pain is associated with shortness of breath. he does report increased stressors recently.  He states he has not taken any medications for symptomatic relief as he does not like taking any medications.  He denies cough, fever, chills, numbness or tingling of his upper extremities, back pain, or hemoptysis.  No recent sick contacts.  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  Past Medical History:  Diagnosis Date  . Arthritis   . Cancer ([place])   . Carcinoma ([place]) 07/2014   adenoid cystic  . Depression   . GERD (gastroesophageal reflux disease)    otc  . History of kidney stones   . History of stress test    workup done for syncope   . 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 Syncopal episodes   . Tinnitus of both ears     Patient Active Problem List   Diagnosis Date Noted  . Chest wall discomfort 06/09/2018  . Essential hypertension 04/12/2018  . Depression due to physical illness 02/28/2018  . Sinusitis nasal 11/24/2017  . Chemotherapy-induced nausea 07/11/2017  . 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 Other constipation 06/14/2017  . Goals of care, counseling/discussion 05/13/2017  . Encounter for antineoplastic  chemotherapy 04/22/2017  . S/P lumbar laminectomy 11/12/2016  . Metastasis to lung ([place]) 08/26/2016  . 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 Syncope 02/21/2014    Past Surgical History:  Procedure Laterality Date  . IR FLUORO GUIDE PORT INSERTION RIGHT  05/05/2017  . IR REMOVAL TUN ACCESS W/ PORT W/O FL MOD SED  11/30/2017  . IR US GUIDE VASC ACCESS RIGHT  05/05/2017  . LUMBAR LAMINECTOMY/DECOMPRESSION MICRODISCECTOMY N/A 11/12/2016   Procedure: MICRODISCECTOMY RIGHT L5-S1;  Surgeon: Jessy Oto, MD;  Location: [facility];  Service: Orthopedics;  Laterality: N/A;  . LUNG REMOVAL, PARTIAL    . NECK SURGERY    . none    . SUBMANDIBULAR GLAND EXCISION Right 15/17/6160   DR Erik Obey  . SUBMANDIBULAR GLAND EXCISION Right 07/12/2014   Procedure: EXCISION RIGHT SUBMANDIBULAR GLAND;  Surgeon: [name], MD;  Location: [facility];  Service: ENT;  Laterality: Right;  . SUBMANDIBULAR GLAND EXCISION Right 08/12/2014   Procedure: Selected neck dissection;  Surgeon: [name], MD;  Location: [facility];  Service: ENT;  Laterality: Right;       Family History  Adopted: Yes  Family history unknown: Yes    Social History   Tobacco Use  . Smoking status: Former Smoker    Packs/day: 0.50    Years: 9.00  Pack years: 4.50    Quit date: 02/22/2011    Years since quitting: 8.5  . Smokeless tobacco: Former Systems developer    Types: Chew  Substance Use Topics  . Alcohol use: Not Currently    Alcohol/week: 0.0 standard drinks    Comment: occas. beer  STOPPED ON 3/4  . Drug use: Not Currently    Types: Marijuana    Comment: once weekly---STOPPED IT 3/4    Home Medications Prior to Admission medications   Medication Sig Start Date End Date Taking? Authorizing Provider  amLODipine (NORVASC) 5 MG tablet Take 1 tablet (5 mg total) by mouth daily. 04/11/18   Heath Lark, MD   cyclobenzaprine (FLEXERIL) 10 MG tablet Take 1 tablet (10 mg total) by mouth 3 (three) times daily as needed. for muscle spams 10/06/18   Dettinger, Fransisca Kaufmann, MD  desvenlafaxine (PRISTIQ) 50 MG 24 hr tablet TAKE 1 TABLET BY MOUTH ONCE DAILY FOR 7 DAYS THEN 2 ONCE DAILY 11/17/18   Dettinger, Fransisca Kaufmann, MD  diazepam (VALIUM) 5 MG tablet Take 1 tablet (5 mg total) by mouth every 8 (eight) hours as needed for anxiety. 10/06/18   Dettinger, Fransisca Kaufmann, MD  famotidine (PEPCID AC) 10 MG tablet Take 10 mg daily as needed by mouth for heartburn or indigestion.    [provider]    Allergies    Hydrocodone  Review of Systems   Review of Systems  Constitutional: Negative for chills, diaphoresis and fever.  Respiratory: Positive for shortness of breath. Negative for cough, chest tightness and wheezing.   Cardiovascular: Positive for chest pain. Negative for leg swelling.  Gastrointestinal: Negative for abdominal pain, nausea and vomiting.  Musculoskeletal: Negative for arthralgias, joint swelling and neck pain.  Skin: Negative for color change and wound.  Neurological: Negative for dizziness, light-headedness and headaches.  Psychiatric/Behavioral: Negative for confusion.    Physical Exam Updated Vital Signs BP 115/88 (BP Location: Left Arm)   Pulse 61   Temp 98.3 F (36.8 C) (Oral)   Resp 15   Ht 6' (1.829 m)   Wt 83.9 kg   BMI 25.09 kg/m   Physical Exam Vitals and nursing note reviewed.  Constitutional:      General: He is not in acute distress.    Appearance: Normal appearance. He is well-developed. He is not ill-appearing.  Cardiovascular:     Rate and Rhythm: Normal rate and regular rhythm.     Pulses: Normal pulses.  Pulmonary:     Effort: Pulmonary effort is normal.     Breath sounds: Normal breath sounds. No wheezing, rhonchi or rales.     Comments: Patient has focal tenderness to palpation of the lateral to anterior left chest wall.  No edema or crepitus.  2 will healed  surgical scars to the left chest.  No rash. Chest:     Chest wall: Tenderness present.  Abdominal:     General: There is no distension.     Palpations: Abdomen is soft.     Tenderness: There is no abdominal tenderness.  Musculoskeletal:        General: Normal range of motion.     Cervical back: Normal range of motion. No tenderness.     Right lower leg: No edema.     Left lower leg: No edema.  Skin:    General: Skin is warm.     Capillary Refill: Capillary refill takes less than 2 seconds.     Findings: No erythema or rash.  Neurological:  General: No focal deficit present.     Mental Status: He is alert.     Sensory: No sensory deficit.     Motor: No weakness.     ED Results / Procedures / Treatments   Labs (all labs ordered are listed, but only abnormal results are displayed) Labs Reviewed  CBC WITH DIFFERENTIAL/PLATELET  BASIC METABOLIC PANEL  TROPONIN I (HIGH SENSITIVITY)    EKG EKG Interpretation  Date/Time:  Thursday Sep 13 2019 09:49:32 EDT Ventricular Rate:  61 PR Interval:    QRS Duration: 89 QT Interval:  415 QTC Calculation: 418 R Axis:   -29 Text Interpretation: Sinus rhythm Borderline left axis deviation Anterior infarct, old Baseline wander in lead(s) V3 Confirmed by Gerlene Fee [phone]) on 09/13/2019 11:09:07 AM   Radiology DG Ribs Unilateral W/Chest Left  Result Date: 09/13/2019 CLINICAL DATA:  Chest pain. EXAM: LEFT RIBS AND CHEST - 3+ VIEW COMPARISON:  March 25, 2017. FINDINGS: No fracture or other bone lesions are seen involving the ribs. There is no evidence of pneumothorax or pleural effusion. Bilateral pulmonary nodules are noted consistent with metastatic disease. Heart size and mediastinal contours are within normal limits. IMPRESSION: No evidence of rib fracture. Bilateral pulmonary nodules are noted consistent with metastatic disease. Electronically Signed   By: Marijo Conception M.D.   On: 09/13/2019 10:51    Procedures Procedures  (including critical care time)  Medications Ordered in ED Medications - No data to display  ED Course  I have reviewed the triage vital signs and the nursing notes.  Pertinent labs & imaging results that were available during my care of the patient were reviewed by me and considered in my medical decision making (see chart for details).    MDM Rules/Calculators/A&P                      Pt here with localized left lateral to anterior chest wall pain.  Pain is reproducable with palpation.  He appears well and non-toxic.  No hx of cardiac disease.  I will obtain EKG, chest XR and labs.  My clinical suspicion for ACS is low.  PERC negative.    Labs, EKG and CXR are reassuring, lung nodules seen on CXR aare c/w previous diagnosis of metastatic dz. Discussed finding with pt and he is aware.  Doubt ACS, PE or dissection.  Pt's sx's felt to be musculoskeletal.  I offered symptomatic treatment, but pt refused stating that he is not going to take any prescription medication.  I also offered topical therapy with lidocaine patch, but he refused this as well.  Return precautions discussed.     Final Clinical Impression(s) / ED Diagnoses Final diagnoses:  Chest wall pain    Rx / DC Orders ED Discharge Orders    None       Bufford Lope 09/14/19 2337    Maudie Flakes, MD 09/20/19 [phone]

## 2019-09-13 NOTE — Discharge Instructions (Addendum)
Your work-up today is overall reassuring.  As discussed, and your chest x-ray does show some nodules of both lungs that are consistent with your history of metastatic disease.  Follow-up with your primary care provider for recheck return to the emergency department for any worsening symptoms.

## 2019-09-13 NOTE — ED Triage Notes (Signed)
Pt states he has been experiencing left sided chest pain that radiates into left arm and side of neck. Pt describes pain as sharp and rates pain 3/10. Pt states he had chest pain 5 years ago and was diagnosed with adenoid cystic carcinoma. States "I don't see a PCP because I don't like them and if they give me pills I throw them in the trash."

## 2019-11-10 IMAGING — CT CT CHEST W/ CM
2 of 3 series · 15 of 36 positions shown, 18 images · IV contrast (omnipaque)
Comparison: 02/23/2018 chest CT.

CLINICAL DATA: Metastatic submandibular gland carcinoma on
chemotherapy. Restaging.

EXAM:
CT CHEST WITH CONTRAST
TECHNIQUE: Multidetector CT imaging of the chest was performed during
intravenous contrast administration.
CONTRAST:  100mL OMNIPAQUE IOHEXOL 300 MG/ML  SOLN

[Series 2: axial st · axial · 0.71mm/px · z∈[-474,-198]mm · 12 of 163 slices shown, 15 images]
[im 13/163  mediastinal]
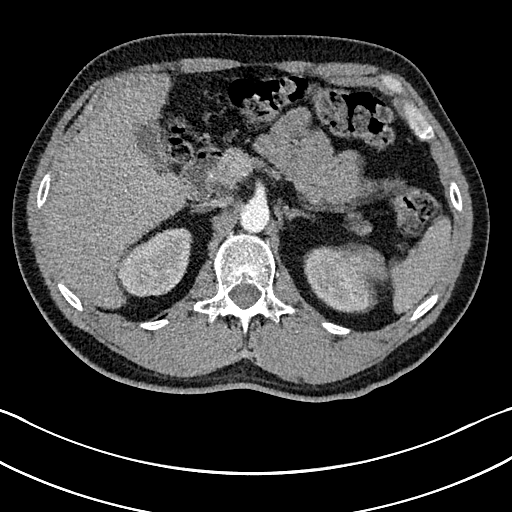
[im 13/163  lung]
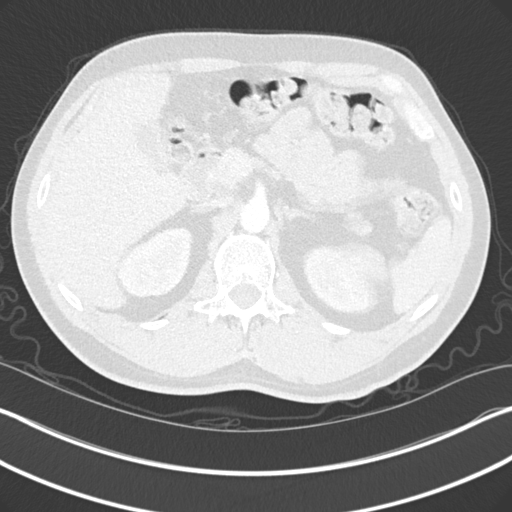
[im 25/163  lung]
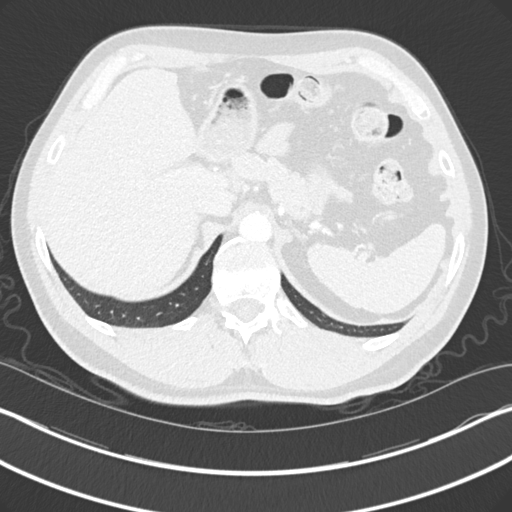
[im 37/163  lung]
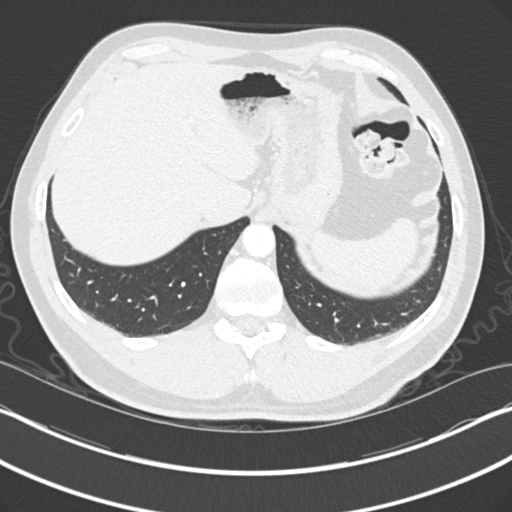
[im 49/163  lung]
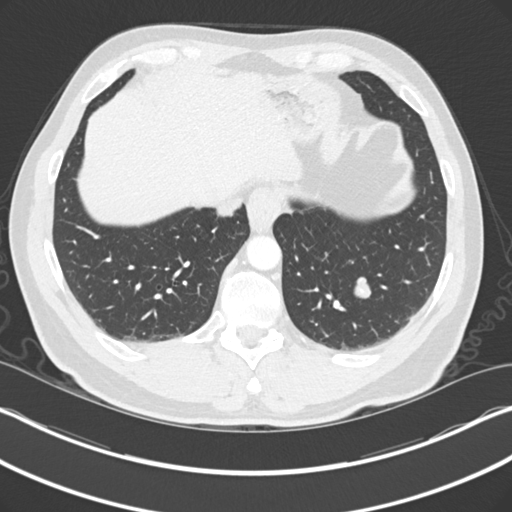
[im 61/163  mediastinal]
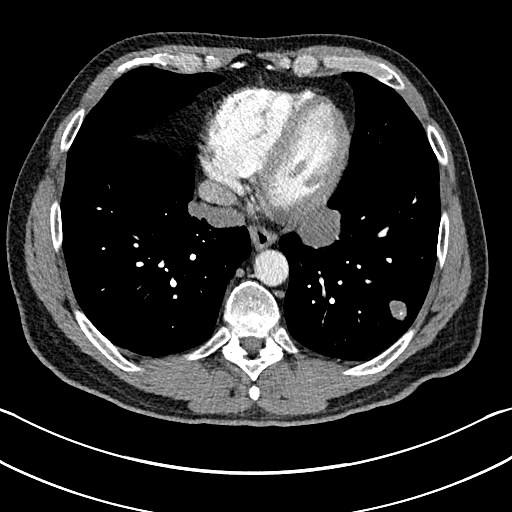
[im 61/163  lung]
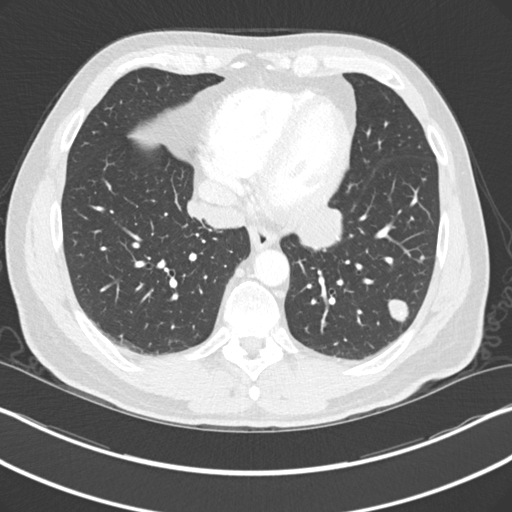
[im 73/163  lung]
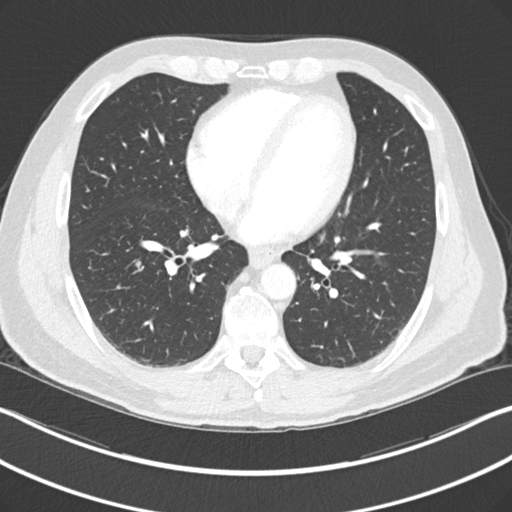
[im 91/163  lung]
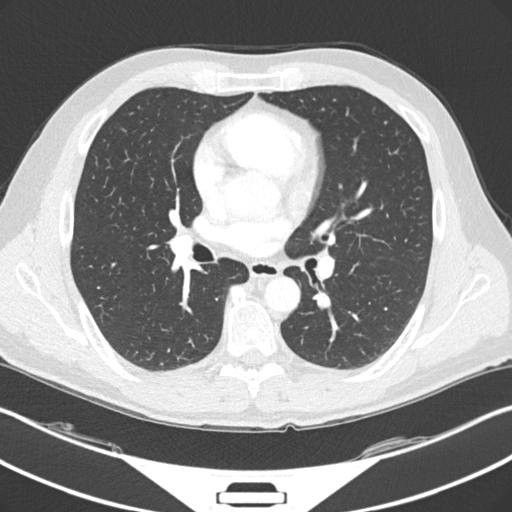
[im 103/163  lung]
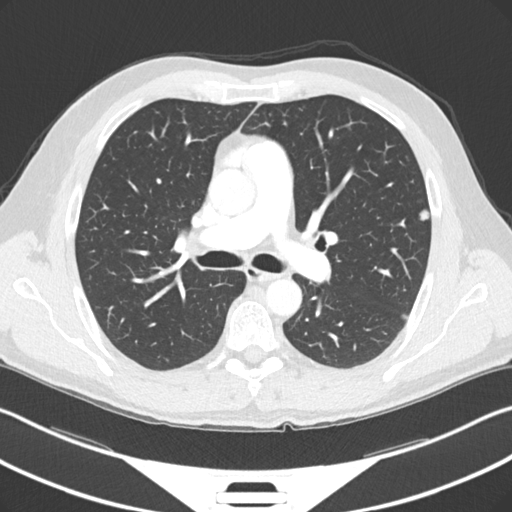
[im 115/163  mediastinal]
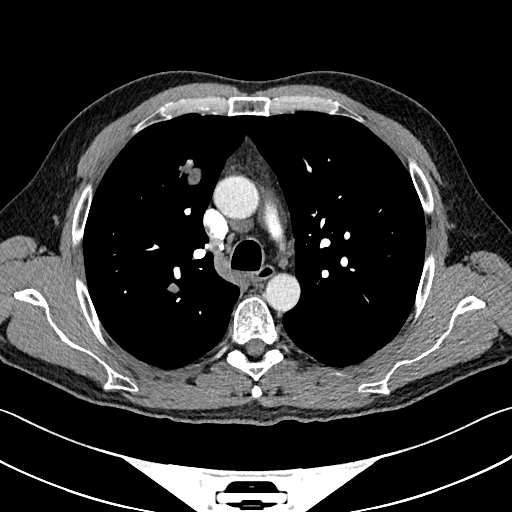
[im 115/163  lung]
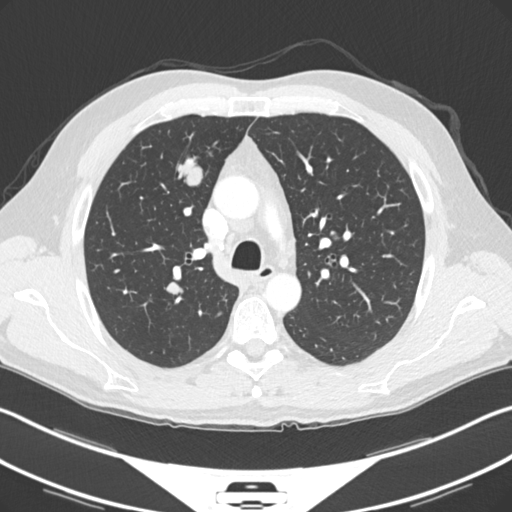
[im 127/163  lung]
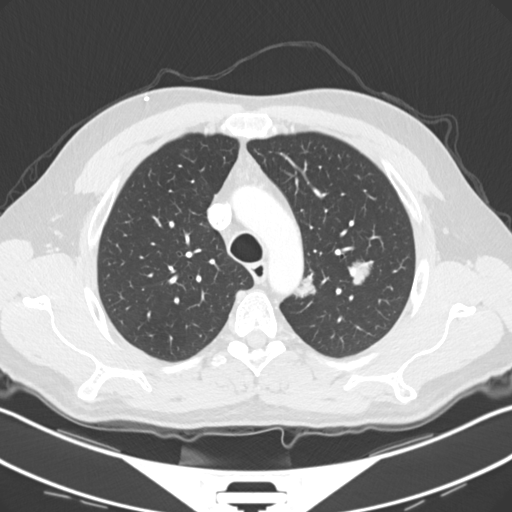
[im 139/163  lung]
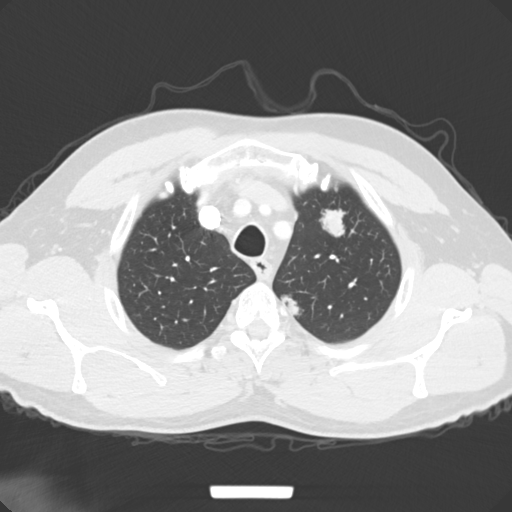
[im 151/163  lung]
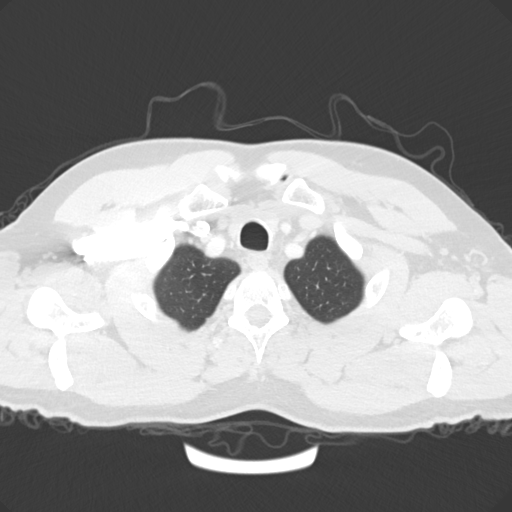

[Series 6: coronal · coronal · 0.66mm/px · 3 of 139 slices shown]
[im 28/139  lung]
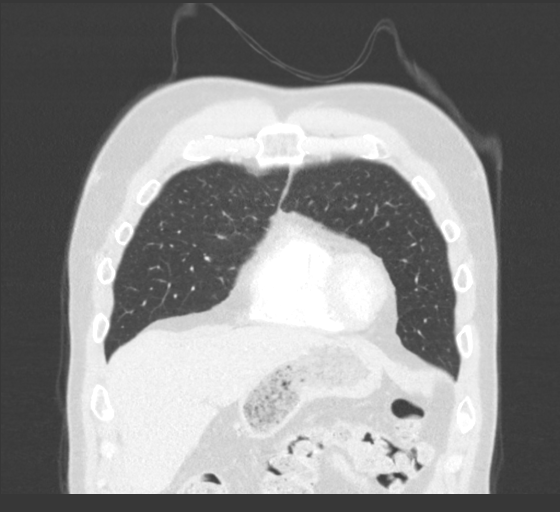
[im 56/139  lung]
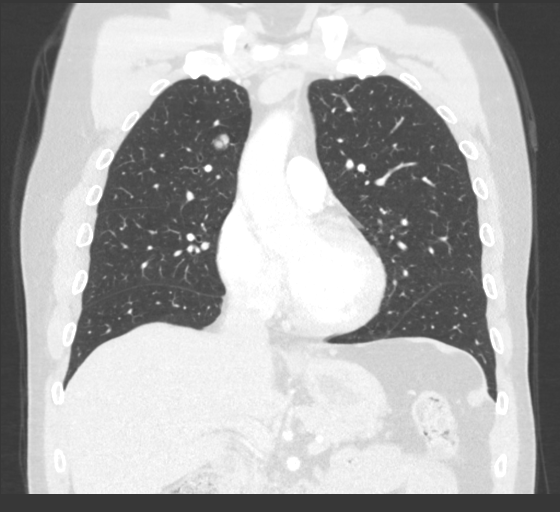
[im 83/139  lung]
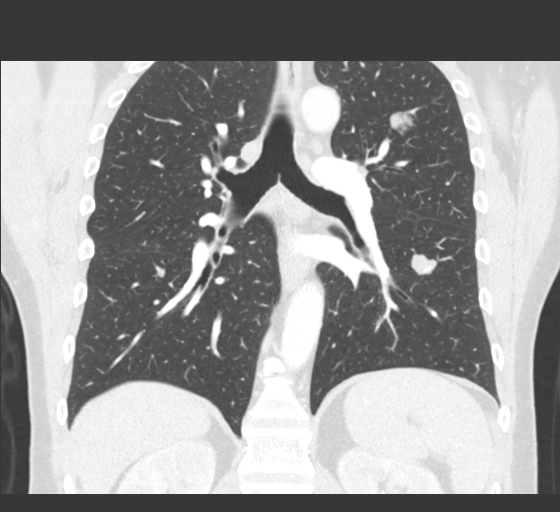

[15 of 36 positions shown; findings below may reference images not displayed]

FINDINGS: Cardiovascular: Normal heart size. No significant pericardial
effusion/thickening. Great vessels are normal in course and caliber.
No central pulmonary emboli.

Mediastinum/Nodes: No discrete thyroid nodules. Unremarkable
esophagus. No pathologically enlarged axillary, mediastinal or hilar
lymph nodes.

Lungs/Pleura: No pneumothorax. No pleural effusion. No acute
consolidative airspace disease. Numerous (at least 20) lung masses
and pulmonary nodules scattered throughout both lungs have all
mildly increased. Representative central left lower lobe 4.1 cm mass
(series 5/image 100), increased from 3.8 cm. Central right lower
lobe 4.5 cm mass (series 5/image 106), increased from 4.2 cm. Left
upper lobe 2.2 cm nodule (series 5/image 40), increased from 1.9 cm.
Apical left upper lobe 2.3 cm nodule (series 5/image 24), increased
from 2.1 cm using similar measurement technique. Right upper lobe
2.6 cm nodule (series 5/image 47), increased from 2.3 cm.

Upper abdomen: Small hiatal hernia.

Musculoskeletal: No aggressive appearing focal osseous lesions. Mild
thoracic spondylosis.
IMPRESSION: 1. Bilateral pulmonary metastases have all mildly increased in size
since 02/23/2018 CT.
2. Small hiatal hernia.

## 2020-01-07 ENCOUNTER — Encounter: Admit: 2020-01-07 | Discharge: 2020-01-07 | Payer: PRIVATE HEALTH INSURANCE

## 2020-01-07 ENCOUNTER — Encounter
Admit: 2020-01-07 | Discharge: 2020-01-07 | Payer: PRIVATE HEALTH INSURANCE | Attending: Hematology & Oncology | Primary: Hematology & Oncology

## 2020-01-07 DIAGNOSIS — R590 Localized enlarged lymph nodes: Secondary | ICD-10-CM | POA: Diagnosis not present

## 2020-01-07 DIAGNOSIS — J984 Other disorders of lung: Secondary | ICD-10-CM | POA: Diagnosis not present

## 2020-01-07 DIAGNOSIS — Z87891 Personal history of nicotine dependence: Secondary | ICD-10-CM | POA: Diagnosis not present

## 2020-01-07 DIAGNOSIS — Z85118 Personal history of other malignant neoplasm of bronchus and lung: Secondary | ICD-10-CM | POA: Diagnosis not present

## 2020-01-07 DIAGNOSIS — Z6825 Body mass index (BMI) 25.0-25.9, adult: Secondary | ICD-10-CM | POA: Diagnosis not present

## 2020-01-07 DIAGNOSIS — C080 Malignant neoplasm of submandibular gland: Secondary | ICD-10-CM | POA: Diagnosis not present

## 2020-01-07 DIAGNOSIS — F419 Anxiety disorder, unspecified: Secondary | ICD-10-CM | POA: Diagnosis not present

## 2020-01-07 DIAGNOSIS — E041 Nontoxic single thyroid nodule: Secondary | ICD-10-CM | POA: Diagnosis not present

## 2020-01-07 DIAGNOSIS — C3492 Malignant neoplasm of unspecified part of left bronchus or lung: Secondary | ICD-10-CM | POA: Diagnosis not present

## 2020-01-11 DIAGNOSIS — C080 Malignant neoplasm of submandibular gland: Principal | ICD-10-CM

## 2020-01-15 ENCOUNTER — Ambulatory Visit
Admit: 2020-01-15 | Discharge: 2020-01-15 | Payer: PRIVATE HEALTH INSURANCE | Attending: Adult Health | Primary: Adult Health

## 2020-01-15 ENCOUNTER — Encounter: Admit: 2020-01-15 | Discharge: 2020-01-15 | Payer: PRIVATE HEALTH INSURANCE

## 2020-01-15 ENCOUNTER — Encounter: Admit: 2020-01-15 | Discharge: 2020-01-15 | Payer: PRIVATE HEALTH INSURANCE | Attending: Oncology | Primary: Oncology

## 2020-01-15 DIAGNOSIS — C760 Malignant neoplasm of head, face and neck: Principal | ICD-10-CM

## 2020-01-15 DIAGNOSIS — C080 Malignant neoplasm of submandibular gland: Principal | ICD-10-CM

## 2020-01-17 ENCOUNTER — Encounter: Admit: 2020-01-17 | Discharge: 2020-01-18 | Payer: PRIVATE HEALTH INSURANCE | Attending: Clinical | Primary: Clinical

## 2020-01-17 DIAGNOSIS — C080 Malignant neoplasm of submandibular gland: Principal | ICD-10-CM

## 2020-01-17 DIAGNOSIS — F339 Major depressive disorder, recurrent, unspecified: Secondary | ICD-10-CM | POA: Diagnosis not present

## 2020-01-17 DIAGNOSIS — F411 Generalized anxiety disorder: Secondary | ICD-10-CM | POA: Diagnosis not present

## 2020-01-17 DIAGNOSIS — F410 Panic disorder [episodic paroxysmal anxiety] without agoraphobia: Secondary | ICD-10-CM | POA: Diagnosis not present

## 2020-01-18 MED ORDER — LORAZEPAM 1 MG TABLET
ORAL_TABLET | Freq: Three times a day (TID) | ORAL | 0 refills | 10 days | Status: CP | PRN
Start: 2020-01-18 — End: 2021-01-17

## 2020-01-22 ENCOUNTER — Ambulatory Visit: Admit: 2020-01-22 | Discharge: 2020-01-23 | Payer: PRIVATE HEALTH INSURANCE | Attending: Clinical | Primary: Clinical

## 2020-01-22 ENCOUNTER — Encounter: Admit: 2020-01-22 | Discharge: 2020-01-23 | Payer: PRIVATE HEALTH INSURANCE

## 2020-01-22 ENCOUNTER — Ambulatory Visit: Admit: 2020-01-22 | Discharge: 2020-01-23 | Payer: PRIVATE HEALTH INSURANCE | Attending: Oncology | Primary: Oncology

## 2020-01-22 ENCOUNTER — Encounter
Admit: 2020-01-22 | Discharge: 2020-01-23 | Payer: PRIVATE HEALTH INSURANCE | Attending: Hematology & Oncology | Primary: Hematology & Oncology

## 2020-01-22 DIAGNOSIS — C760 Malignant neoplasm of head, face and neck: Principal | ICD-10-CM

## 2020-01-22 DIAGNOSIS — C080 Malignant neoplasm of submandibular gland: Principal | ICD-10-CM

## 2020-01-22 DIAGNOSIS — F411 Generalized anxiety disorder: Secondary | ICD-10-CM | POA: Diagnosis not present

## 2020-01-22 DIAGNOSIS — D1803 Hemangioma of intra-abdominal structures: Secondary | ICD-10-CM | POA: Diagnosis not present

## 2020-01-22 DIAGNOSIS — F410 Panic disorder [episodic paroxysmal anxiety] without agoraphobia: Secondary | ICD-10-CM | POA: Diagnosis not present

## 2020-01-22 DIAGNOSIS — F419 Anxiety disorder, unspecified: Secondary | ICD-10-CM | POA: Diagnosis not present

## 2020-01-22 DIAGNOSIS — F339 Major depressive disorder, recurrent, unspecified: Secondary | ICD-10-CM | POA: Diagnosis not present

## 2020-01-22 DIAGNOSIS — Z5111 Encounter for antineoplastic chemotherapy: Secondary | ICD-10-CM | POA: Diagnosis not present

## 2020-01-22 DIAGNOSIS — Z87891 Personal history of nicotine dependence: Secondary | ICD-10-CM | POA: Diagnosis not present

## 2020-01-22 DIAGNOSIS — C779 Secondary and unspecified malignant neoplasm of lymph node, unspecified: Secondary | ICD-10-CM | POA: Diagnosis not present

## 2020-01-22 DIAGNOSIS — J984 Other disorders of lung: Secondary | ICD-10-CM | POA: Diagnosis not present

## 2020-01-22 MED ORDER — GABAPENTIN 300 MG CAPSULE
ORAL_CAPSULE | Freq: Every evening | ORAL | 3 refills | 30.00000 days | Status: CP
Start: 2020-01-22 — End: 2021-01-21

## 2020-01-22 MED ORDER — ONDANSETRON HCL 8 MG TABLET
ORAL_TABLET | Freq: Three times a day (TID) | ORAL | 2 refills | 10.00000 days | Status: CP | PRN
Start: 2020-01-22 — End: 2021-01-21

## 2020-01-24 ENCOUNTER — Encounter
Admit: 2020-01-24 | Discharge: 2020-01-25 | Payer: PRIVATE HEALTH INSURANCE | Attending: Psychiatry | Primary: Psychiatry

## 2020-01-24 DIAGNOSIS — F4323 Adjustment disorder with mixed anxiety and depressed mood: Secondary | ICD-10-CM | POA: Diagnosis not present

## 2020-01-29 ENCOUNTER — Ambulatory Visit
Admit: 2020-01-29 | Discharge: 2020-01-30 | Payer: PRIVATE HEALTH INSURANCE | Attending: Adult Health | Primary: Adult Health

## 2020-01-29 ENCOUNTER — Encounter: Admit: 2020-01-29 | Discharge: 2020-01-30 | Payer: PRIVATE HEALTH INSURANCE

## 2020-01-29 DIAGNOSIS — C760 Malignant neoplasm of head, face and neck: Principal | ICD-10-CM

## 2020-01-29 DIAGNOSIS — C080 Malignant neoplasm of submandibular gland: Principal | ICD-10-CM

## 2020-01-29 DIAGNOSIS — Z6824 Body mass index (BMI) 24.0-24.9, adult: Secondary | ICD-10-CM | POA: Diagnosis not present

## 2020-01-29 DIAGNOSIS — Z5111 Encounter for antineoplastic chemotherapy: Secondary | ICD-10-CM | POA: Diagnosis not present

## 2020-01-29 MED ORDER — GABAPENTIN 300 MG CAPSULE
ORAL_CAPSULE | Freq: Three times a day (TID) | ORAL | 2 refills | 30 days | Status: CP
Start: 2020-01-29 — End: 2020-02-28

## 2020-01-29 MED ORDER — PROCHLORPERAZINE MALEATE 10 MG TABLET
ORAL_TABLET | Freq: Four times a day (QID) | ORAL | 1 refills | 8 days | Status: CP | PRN
Start: 2020-01-29 — End: 2020-02-28

## 2020-01-29 MED ORDER — OLANZAPINE 5 MG TABLET
ORAL_TABLET | Freq: Every evening | ORAL | 1 refills | 30 days | Status: CP
Start: 2020-01-29 — End: 2020-02-28

## 2020-01-29 MED ORDER — ONDANSETRON HCL 8 MG TABLET
ORAL_TABLET | Freq: Three times a day (TID) | ORAL | 2 refills | 10 days | Status: CP | PRN
Start: 2020-01-29 — End: 2021-01-28

## 2020-02-12 ENCOUNTER — Other Ambulatory Visit: Admit: 2020-02-12 | Discharge: 2020-02-13 | Payer: PRIVATE HEALTH INSURANCE

## 2020-02-12 ENCOUNTER — Ambulatory Visit: Admit: 2020-02-12 | Discharge: 2020-02-13 | Payer: PRIVATE HEALTH INSURANCE

## 2020-02-12 ENCOUNTER — Encounter
Admit: 2020-02-12 | Discharge: 2020-02-13 | Payer: PRIVATE HEALTH INSURANCE | Attending: Hematology & Oncology | Primary: Hematology & Oncology

## 2020-02-12 DIAGNOSIS — C080 Malignant neoplasm of submandibular gland: Principal | ICD-10-CM

## 2020-02-12 DIAGNOSIS — C7800 Secondary malignant neoplasm of unspecified lung: Principal | ICD-10-CM

## 2020-02-12 DIAGNOSIS — C760 Malignant neoplasm of head, face and neck: Principal | ICD-10-CM

## 2020-02-12 DIAGNOSIS — Z5111 Encounter for antineoplastic chemotherapy: Secondary | ICD-10-CM | POA: Diagnosis not present

## 2020-02-12 DIAGNOSIS — Z87891 Personal history of nicotine dependence: Secondary | ICD-10-CM | POA: Diagnosis not present

## 2020-02-12 DIAGNOSIS — Z6825 Body mass index (BMI) 25.0-25.9, adult: Secondary | ICD-10-CM | POA: Diagnosis not present

## 2020-02-12 DIAGNOSIS — F419 Anxiety disorder, unspecified: Secondary | ICD-10-CM | POA: Diagnosis not present

## 2020-02-12 DIAGNOSIS — Z79899 Other long term (current) drug therapy: Secondary | ICD-10-CM | POA: Diagnosis not present

## 2020-02-18 ENCOUNTER — Encounter: Admit: 2020-02-18 | Discharge: 2020-02-19 | Payer: PRIVATE HEALTH INSURANCE | Attending: Clinical | Primary: Clinical

## 2020-02-18 DIAGNOSIS — C760 Malignant neoplasm of head, face and neck: Principal | ICD-10-CM

## 2020-02-18 DIAGNOSIS — F339 Major depressive disorder, recurrent, unspecified: Secondary | ICD-10-CM | POA: Diagnosis not present

## 2020-02-18 DIAGNOSIS — F410 Panic disorder [episodic paroxysmal anxiety] without agoraphobia: Secondary | ICD-10-CM | POA: Diagnosis not present

## 2020-02-18 DIAGNOSIS — F411 Generalized anxiety disorder: Secondary | ICD-10-CM | POA: Diagnosis not present

## 2020-02-19 ENCOUNTER — Ambulatory Visit
Admit: 2020-02-19 | Discharge: 2020-02-20 | Payer: PRIVATE HEALTH INSURANCE | Attending: Adult Health | Primary: Adult Health

## 2020-02-19 ENCOUNTER — Encounter: Admit: 2020-02-19 | Discharge: 2020-02-20 | Payer: PRIVATE HEALTH INSURANCE

## 2020-02-19 DIAGNOSIS — C080 Malignant neoplasm of submandibular gland: Principal | ICD-10-CM

## 2020-02-19 DIAGNOSIS — C760 Malignant neoplasm of head, face and neck: Principal | ICD-10-CM

## 2020-02-19 DIAGNOSIS — Z6825 Body mass index (BMI) 25.0-25.9, adult: Secondary | ICD-10-CM | POA: Diagnosis not present

## 2020-02-19 DIAGNOSIS — Z5111 Encounter for antineoplastic chemotherapy: Secondary | ICD-10-CM | POA: Diagnosis not present

## 2020-02-19 MED ORDER — OLANZAPINE 7.5 MG TABLET
ORAL_TABLET | Freq: Every evening | ORAL | 2 refills | 30.00000 days | Status: CP
Start: 2020-02-19 — End: 2021-02-18

## 2020-02-29 DIAGNOSIS — C760 Malignant neoplasm of head, face and neck: Principal | ICD-10-CM

## 2020-03-04 ENCOUNTER — Encounter
Admit: 2020-03-04 | Discharge: 2020-03-05 | Payer: PRIVATE HEALTH INSURANCE | Attending: Hematology & Oncology | Primary: Hematology & Oncology

## 2020-03-04 ENCOUNTER — Encounter: Admit: 2020-03-04 | Discharge: 2020-03-05 | Payer: PRIVATE HEALTH INSURANCE

## 2020-03-04 ENCOUNTER — Ambulatory Visit: Admit: 2020-03-04 | Discharge: 2020-03-05 | Payer: PRIVATE HEALTH INSURANCE | Attending: Oncology | Primary: Oncology

## 2020-03-04 DIAGNOSIS — C760 Malignant neoplasm of head, face and neck: Principal | ICD-10-CM

## 2020-03-04 DIAGNOSIS — C080 Malignant neoplasm of submandibular gland: Principal | ICD-10-CM

## 2020-03-04 DIAGNOSIS — Z87891 Personal history of nicotine dependence: Secondary | ICD-10-CM | POA: Diagnosis not present

## 2020-03-04 DIAGNOSIS — C7801 Secondary malignant neoplasm of right lung: Secondary | ICD-10-CM | POA: Diagnosis not present

## 2020-03-04 DIAGNOSIS — E041 Nontoxic single thyroid nodule: Secondary | ICD-10-CM | POA: Diagnosis not present

## 2020-03-04 DIAGNOSIS — Z6826 Body mass index (BMI) 26.0-26.9, adult: Secondary | ICD-10-CM | POA: Diagnosis not present

## 2020-03-04 DIAGNOSIS — C782 Secondary malignant neoplasm of pleura: Secondary | ICD-10-CM | POA: Diagnosis not present

## 2020-03-04 DIAGNOSIS — D1803 Hemangioma of intra-abdominal structures: Secondary | ICD-10-CM | POA: Diagnosis not present

## 2020-03-04 DIAGNOSIS — C7802 Secondary malignant neoplasm of left lung: Secondary | ICD-10-CM | POA: Diagnosis not present

## 2020-03-04 MED ORDER — LENVIMA 20 MG/DAY (10 MG X 2) CAPSULE
ORAL_CAPSULE | Freq: Every day | ORAL | 5 refills | 0 days | Status: CP
Start: 2020-03-04 — End: ?
  Filled 2020-03-13: qty 30, 15d supply, fill #0

## 2020-03-05 DIAGNOSIS — C760 Malignant neoplasm of head, face and neck: Principal | ICD-10-CM

## 2020-03-06 DIAGNOSIS — C760 Malignant neoplasm of head, face and neck: Principal | ICD-10-CM

## 2020-03-10 ENCOUNTER — Encounter: Admit: 2020-03-10 | Discharge: 2020-03-11 | Payer: PRIVATE HEALTH INSURANCE | Attending: Clinical | Primary: Clinical

## 2020-03-10 DIAGNOSIS — F411 Generalized anxiety disorder: Principal | ICD-10-CM

## 2020-03-10 DIAGNOSIS — F410 Panic disorder [episodic paroxysmal anxiety] without agoraphobia: Principal | ICD-10-CM

## 2020-03-10 DIAGNOSIS — F339 Major depressive disorder, recurrent, unspecified: Principal | ICD-10-CM

## 2020-03-12 NOTE — Unmapped (Signed)
[facility] Specialty Medication Onboarding    Specialty Medication: Assunta Curtis  Prior Authorization: Approved   Financial Assistance: Yes - grant approved as secondary   Final Copay/Day Supply: $0 / 30 days    Insurance Restrictions: Yes - max 1 month supply     Notes to Pharmacist:     The triage team has completed the benefits investigation and has determined that the patient is able to fill this medication at [facility]. Please contact the patient to complete the onboarding or follow up with the prescribing physician as needed.

## 2020-03-13 MED FILL — LENVIMA 20 MG/DAY (10 MG X 2) CAPSULE: 15 days supply | Qty: 30 | Fill #0 | Status: AC

## 2020-03-13 NOTE — Unmapped (Signed)
[facility]   Patient Onboarding/Medication Counseling    Jason Frey is a 44 y.o. 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  I am speaking to the patient.    Was a Nurse, learning disability used for this call? No    Verified patient's date of birth / HIPAA.    Specialty medication(s) to be sent: Hematology/Oncology: Assunta Curtis      Non-specialty medications/supplies to be sent: n/a      Medications not needed at this time: n/a         Lenvima (Lenvatinib)    Medication & Administration     Dosage:  take 20mg  (two 10mg  capsules) daily  Comes in blister card packaging.    Administration:   ??? Comes in a blister card packing. It is important to take exact dose as prescribed.    ??? Administer orally at the same time each day.   ??? May be administered with or without food.  ??? Capsules may be swallowed whole or dissolved in a small glass of liquid. To dissolve in liquid, measure 15 mL of water or apple juice into a glass; add whole capsule (do not break or crush capsule) and leave in liquid for at least 10 minutes, then stir for at least 3 minutes. Administer liquid, then add 15 mL of additional water or apple juice to glass, swirl a few times and then swallow additional liquid.    Adherence/Missed dose instructions: If you miss a dose, take it as soon as you remember.  If the next dose is scheduled within 12 hours, skip the missed dose and take the next dose at its next regularly scheduled time. Do not take an extra dose to make up for a missed dose.    Goals of Therapy     Prevent disease progression    Side Effects & Monitoring Parameters     ??? Nausea/Vomiting  ??? Diarrhea  ??? Fatigue  ??? Cough   ??? High Blood Pressure  ??? Hand Foot Syndrome  ??? Increase bleeding risk  ??? Decreased appetite and changes in taste  ??? Dry mouth, mouth irritation, mouth pain, mouth sores  ??? Joint/muscle aches  ??? Restlessness, difficulty sleeping  ??? Impaired wound healing  ??? Heartburn  ??? Hair loss   ??? Headache  ??? Protein in the urine  ??? Voice changes     The following side effects should be reported to the provider:  ??? Heartbeat that doesn't feel normal (heart feels like it's racing, skipping a beat or fluttering)  ??? Hypertension  ??? Dark urine or yellow skin or eyes  ??? Signs of infection (fever, chills, cough)  ??? Excessive bruising, gums bleeding or nose bleeds  ??? Mouth irritation or mouth sores  ??? Redness or irritation on the palms of the hands or soles of the feet  ??? Swelling, warmth, numbness, change in color or pain in a leg or arm  ??? Signs of low thyroid (constipation, trouble handling heat or cold, memory problems, mood changes, or burning, numbness or tingling feeling).  ??? Severe or persistent diarrhea  ??? Signs of posterior reversible encephalopathy syndrome (confusion, not alert, vision changes, seizures, or severe headache)  ??? Signs of cerebrovascular disease (change in strength on one side is greater than the other, trouble speaking or thinking, change in balance or vision changes)  ??? Signs of fluid and electrolyte problems (mood changes, confusion, muscle pain or weakness, abnormal/fast  heartbeat, severe dizziness, passing out)  ??? Signs of kidney problems (inability to pass urine, blood in the urine, change in amount of urine passed, weight gain)  ??? Signs of UTI (blood in the urine, burning or painful urination, passing a lot of urine, fever, lower abdominal or pelvic pain)  ??? Signs of anaphylaxis (wheezing, chest tightness, swelling of face, lips, tongue or throat)    Monitoring Parameters:  ??? Monitor for Qtc prolongation  ??? Blood pressure  ??? Liver function tests at baseline and during treatment  ??? Renal function  ??? Electrolytes  ??? Serum calcium  ??? TSH  ??? Thyroid function at baseline and monthly  ??? Proteinuria baseline and during treatment  ??? Pregnancy test in females of reproductive potential      Contraindications, Warnings, & Precautions     ??? Hand-foot syndrome- counseled patient to use moisturizer on hands/feet, use lukewarm water especially when washing hands and pat them dry instead of rubbing them   ??? Mouth sores- discussed use of baking soda/salt water rinses   ??? Cardiac effects : hypertension  ??? GI perforation  ??? Hemorrhage  ??? Hepatotoxicity  ??? Hypocalcemia  ??? Hypothyroidism  ??? Reversible posterior leukocencephalopathy syndrome  ??? Thromboembolic events  ??? Wound healing impairment  ??? Exposure of an unborn child to this medication could cause birth defects, so you should not become pregnant or father a child while on this medication.  Effective birth control is necessary during treatment and for at least 30 days after treatment.    Drug/Food Interactions     ??? Medication list reviewed in Epic. The patient was instructed to inform the care team before taking any new medications or supplements including over the counter medications, vitamins, and herbal supplements. No drug interactions identified.     Storage, Handling Precautions, & Disposal     ??? This medication should be stored at room temperature and in a dry location. Keep out of reach of others including children and pets. Keep the medicine in the original blister packs (no pillboxes).   ??? Do not throw away or flush unused mediation down the toilet or sink. This drug is considered hazardous and should be handled as little as possible.  If someone else helps with medication administration, they should wear gloves      Current Medications (including OTC/herbals), Comorbidities and Allergies     Current Outpatient Medications   Medication Sig Dispense Refill   ??? gabapentin (NEURONTIN) 300 MG capsule Take 1 capsule (300 mg total) by mouth Three (3) times a day. 90 capsule 2   ??? lenvatinib (LENVIMA) 20 mg/day (10 mg x 2) cap Take 2 capsules (20 mg) by mouth daily. 60 capsule 5   ??? LORazepam (ATIVAN) 1 MG tablet Take 1 tablet (1 mg total) by mouth every eight (8) hours as needed for anxiety. 30 tablet 0   ??? OLANZapine (ZYPREXA) 7.5 MG tablet Take 1 tablet (7.5 mg total) by mouth nightly. 30 tablet 2   ??? ondansetron (ZOFRAN) 8 MG tablet Take 1 tablet (8 mg total) by mouth every eight (8) hours as needed for nausea. 30 tablet 2     No current facility-administered medications for this visit.       Allergies   Allergen Reactions   ??? Hydrocodone Itching       Patient Active Problem List   Diagnosis   ??? 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 Adenoid cystic carcinoma of head and neck (CMS-HCC)   ???  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 Chemotherapy-induced nausea   ??? Chest wall discomfort   ??? Depression due to physical illness   ??? Encounter for antineoplastic chemotherapy   ??? Essential hypertension   ??? Herniated intervertebral disc of lumbar spine   ??? Other constipation   ??? Other disorders of lung   ??? 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 S/P lumbar laminectomy   ??? Scar neuroma   ??? Sinusitis nasal   ??? Submandibular sialolithiasis   ??? Syncope       Reviewed and up to date in Epic.    Appropriateness of Therapy     Is medication and dose appropriate based on diagnosis? Yes    Prescription has been clinically reviewed: Yes    Baseline Quality of Life Assessment      How many days over the past month did your condition  keep you from your normal activities? For example, brushing your teeth or getting up in the morning. 0    Financial Information     Medication Assistance provided: Prior Authorization and [employer]    Anticipated copay of $0 reviewed with patient. Verified delivery address.    Delivery Information     Scheduled delivery date: 03/14/20    Expected start date: TBD    Medication will be delivered via UPS to the prescription address in [facility].  This shipment will not require a signature.      Explained the services we provide at [facility] and that each month we would call to set up refills.  Stressed importance of returning phone calls so that we could ensure they receive their medications in time each month.  Informed patient that we should be setting up refills 7-10 days prior to when they will run out of medication.  A pharmacist will reach out to perform a clinical assessment periodically.  Informed patient that a welcome packet and a drug information handout will be sent.      Patient verbalized understanding of the above information as well as how to contact the pharmacy at [phone] option 4 with any questions/concerns.  The pharmacy is open Monday through Friday 8:30am-4:30pm.  A pharmacist is available 24/7 via pager to answer any clinical questions they may have.    Patient Specific Needs     - Does the patient have any physical, cognitive, or cultural barriers? No    - Patient prefers to have medications discussed with  Patient     - Is the patient or caregiver able to read and understand education materials at a high school level or above? Yes    - Patient's primary language is  English     - Is the patient high risk? Yes, patient is taking oral chemotherapy. Appropriateness of therapy as been assessed    - Does the patient require a Care Management Plan? No     - Does the patient require physician intervention or other additional services (i.e. nutrition, smoking cessation, social work)? No      Florene Route [facility] Specialty Pharmacist

## 2020-03-14 NOTE — Unmapped (Signed)
Patient did not stay for visit or chemotherapy.  Upon entering the room, patient was pacing back and forth and loudly stated I need to get out of here.  Patient was very anxious and repeated the same statement numerous times.  He stated he wanted his IV out and to leave.  While removing his IV I attempted to talk with him regarding his anxiety and if he felt as if he needed to discuss his anxiety with anyone or if he had and SI or HI, or if he would like to try any medication for his anxiety.  Unfortunately due to his repeating he needed to leave, I was unable to complete the conversation.  In removing his IV, I stated the tegaderm may pull his hair some.  Patient stated it didn't matter, grabbed the dressing, pushing my hand aside and ripped it from his arm.  As he pulled the dressing and IV cannula outward, he let it go with the dressing and cannula hitting this writer on the right side of my mask at the area of the right cheek.  I noticed the site to the right inner forearm almost at level of the [facility] was bleeding.  Patient agreed to have a bandaid applied to the area.  Upon placing the bandaid he turned and stated I gotta get out of here.  As he was leaving I said to apply pressure to the area and keep his arm bent at the elbow to prevent ongoing bleeding.  Patient and male that was present with him then left the examination room and out through the waiting room.      The patient appeared to be having a severe anxiety/panic attack and overwhelmed at his current situation.  He has an appointment with one of our therapist on Thursday.  Will discuss the events of today with therapist and inform of the patients attack today.  Additionally, I am willing to prescribe some short term anti-anxiety medication if needed so he can proceed with his treatment.  Additionally, I am more than willing to accommodate him if he should need alternate visit, times, etc to further help reduce his anxiety.      Assunta Gambles [place]errath Madding, ANP-C

## 2020-03-18 DIAGNOSIS — C080 Malignant neoplasm of submandibular gland: Principal | ICD-10-CM

## 2020-03-18 NOTE — Unmapped (Signed)
Called and spoke to patient. Due to insurance issues, his pembrolizumab has not yet been approved. Therefore, we will move his appointments for tomorrow out a week. Continue with Lenvima.Scheduler will call patient with new date and time

## 2020-03-24 NOTE — Unmapped (Signed)
[facility] Clinical Assessment & Refill Coordination Note    Jason Frey, DOB: 01-Aug-1975  Phone: [phone] (home)     All above HIPAA information was verified with patient.     Was a Nurse, learning disability used for this call? No    Specialty Medication(s):   Hematology/Oncology: Assunta Curtis     Current Outpatient Medications   Medication Sig Dispense Refill   ??? gabapentin (NEURONTIN) 300 MG capsule Take 1 capsule (300 mg total) by mouth Three (3) times a day. 90 capsule 2   ??? lenvatinib (LENVIMA) 20 mg/day (10 mg x 2) cap Take 2 capsules (20 mg) by mouth daily. 60 capsule 5   ??? LORazepam (ATIVAN) 1 MG tablet Take 1 tablet (1 mg total) by mouth every eight (8) hours as needed for anxiety. 30 tablet 0   ??? OLANZapine (ZYPREXA) 7.5 MG tablet Take 1 tablet (7.5 mg total) by mouth nightly. 30 tablet 2   ??? ondansetron (ZOFRAN) 8 MG tablet Take 1 tablet (8 mg total) by mouth every eight (8) hours as needed for nausea. 30 tablet 2     No current facility-administered medications for this visit.        Changes to medications: Riyaan reports no changes at this time.    Allergies   Allergen Reactions   ??? Hydrocodone Itching       Changes to allergies: No    SPECIALTY MEDICATION ADHERENCE     Lenvima 20 mg: 7 days of medicine on hand       Medication Adherence    Patient reported X missed doses in the last month: 1  Specialty Medication: Lenvima   Patient is on additional specialty medications: No  Informant: patient  Confirmed plan for next specialty medication refill: delivery by pharmacy  Refills needed for supportive medications: not needed          Specialty medication(s) dose(s) confirmed: Regimen is correct and unchanged.     Are there any concerns with adherence? No    Adherence counseling provided? Not needed    CLINICAL MANAGEMENT AND INTERVENTION      Clinical Benefit Assessment:    Do you feel the medicine is effective or helping your condition? Patient declined to answer    Clinical Benefit counseling provided? pt in quarantine, had to reschedule appt    Adverse Effects Assessment:    Are you experiencing any side effects? No    Are you experiencing difficulty administering your medicine? No    Quality of Life Assessment:    How many days over the past month did your condition  keep you from your normal activities? For example, brushing your teeth or getting up in the morning. 0    Have you discussed this with your provider? Not needed    Therapy Appropriateness:    Is therapy appropriate? Yes, therapy is appropriate and should be continued    DISEASE/MEDICATION-SPECIFIC INFORMATION      N/A    PATIENT SPECIFIC NEEDS     - Does the patient have any physical, cognitive, or cultural barriers? No    - Is the patient high risk? Yes, patient is taking oral chemotherapy. Appropriateness of therapy as been assessed    - Does the patient require a Care Management Plan? No     - Does the patient require physician intervention or other additional services (i.e. nutrition, smoking cessation, social work)? No      SHIPPING     Specialty Medication(s) to be  Shipped:   Hematology/Oncology: Assunta Curtis    Other medication(s) to be shipped: No additional medications requested for fill at this time     Changes to insurance: No    Delivery Scheduled: Yes, Expected medication delivery date: 03/28/20.     Medication will be delivered via UPS to the confirmed prescription address in [facility].    The patient will receive a drug information handout for each medication shipped and additional FDA Medication Guides as required.  Verified that patient has previously received a Conservation officer, historic buildings.    All of the patient's questions and concerns have been addressed.    Mikhi Athey Vangie Bicker   [facility] Specialty Pharmacist

## 2020-03-24 NOTE — Unmapped (Signed)
Patient's 45 year old daughter was sent home from school on Thursday due to a covid exposure at school. On Friday, they decided to get her tested--her only symptom was a mild headache, which has resolved--which came back positive.   Patient is not vaccinated, is currently experiencing no symptoms. Per [employer] site, he will need to quarantine for 14 days. However, due to the age of his daughter, patient states there is no way to isolate her. Will follow up on recommendations for when to return and timing of testing.        COVID-19 Herbalist - CDC

## 2020-03-27 MED FILL — LENVIMA 20 MG/DAY (10 MG X 2) CAPSULE: ORAL | 15 days supply | Qty: 30 | Fill #1

## 2020-03-27 MED FILL — LENVIMA 20 MG/DAY (10 MG X 2) CAPSULE: 15 days supply | Qty: 30 | Fill #1 | Status: AC

## 2020-03-31 MED ORDER — SILDENAFIL 50 MG TABLET
ORAL_TABLET | Freq: Every day | ORAL | 0 refills | 20 days | Status: CP | PRN
Start: 2020-03-31 — End: 2021-03-31

## 2020-04-08 NOTE — Unmapped (Signed)
[facility] Clinical Assessment & Refill Coordination Note    Jason Frey, DOB: 1976/01/28  Phone: [phone] (home)     All above HIPAA information was verified with patient.     Was a Nurse, learning disability used for this call? No    Specialty Medication(s):   Hematology/Oncology: Assunta Curtis     Current Outpatient Medications   Medication Sig Dispense Refill   ??? gabapentin (NEURONTIN) 300 MG capsule Take 1 capsule (300 mg total) by mouth Three (3) times a day. 90 capsule 2   ??? lenvatinib (LENVIMA) 20 mg/day (10 mg x 2) cap Take 2 capsules (20 mg) by mouth daily. 60 capsule 5   ??? LORazepam (ATIVAN) 1 MG tablet Take 1 tablet (1 mg total) by mouth every eight (8) hours as needed for anxiety. 30 tablet 0   ??? OLANZapine (ZYPREXA) 7.5 MG tablet Take 1 tablet (7.5 mg total) by mouth nightly. 30 tablet 2   ??? ondansetron (ZOFRAN) 8 MG tablet Take 1 tablet (8 mg total) by mouth every eight (8) hours as needed for nausea. 30 tablet 2   ??? sildenafiL (VIAGRA) 50 MG tablet Take 1 tablet (50 mg total) by mouth daily as needed for erectile dysfunction. 20 tablet 0     No current facility-administered medications for this visit.        Changes to medications: Brandis reports no changes at this time.    Allergies   Allergen Reactions   ??? Hydrocodone Itching       Changes to allergies: No    SPECIALTY MEDICATION ADHERENCE     Lenvima 20 mg: 9 days of medicine on hand     Medication Adherence    Patient reported X missed doses in the last month: 0  Specialty Medication: Lenvima 20mg           Specialty medication(s) dose(s) confirmed: Regimen is correct and unchanged.     Are there any concerns with adherence? No    Adherence counseling provided? Not needed    CLINICAL MANAGEMENT AND INTERVENTION      Clinical Benefit Assessment:    Do you feel the medicine is effective or helping your condition? Yes    Clinical Benefit counseling provided? Not needed    Adverse Effects Assessment:    Are you experiencing any side effects? No    Are you experiencing difficulty administering your medicine? No    Quality of Life Assessment:    How many days over the past month did your condition/medication  keep you from your normal activities? For example, brushing your teeth or getting up in the morning. 0    Have you discussed this with your provider? Not needed    Therapy Appropriateness:    Is therapy appropriate? Yes, therapy is appropriate and should be continued    DISEASE/MEDICATION-SPECIFIC INFORMATION      N/A    PATIENT SPECIFIC NEEDS     - Does the patient have any physical, cognitive, or cultural barriers? No    - Is the patient high risk? Yes, patient is taking oral chemotherapy. Appropriateness of therapy as been assessed    - Does the patient require a Care Management Plan? No     - Does the patient require physician intervention or other additional services (i.e. nutrition, smoking cessation, social work)? No      SHIPPING     Specialty Medication(s) to be Shipped:   Hematology/Oncology: Assunta Curtis    Other medication(s) to be shipped: No additional  medications requested for fill at this time     Changes to insurance: No    Delivery Scheduled: Yes, Expected medication delivery date: 04/11/20.     Medication will be delivered via UPS to the confirmed prescription address in [facility].    The patient will receive a drug information handout for each medication shipped and additional FDA Medication Guides as required.  Verified that patient has previously received a Conservation officer, historic buildings.    All of the patient's questions and concerns have been addressed.    Rollen Sox   [facility] Specialty Pharmacist

## 2020-04-10 DIAGNOSIS — C080 Malignant neoplasm of submandibular gland: Principal | ICD-10-CM

## 2020-04-10 MED FILL — LENVIMA 20 MG/DAY (10 MG X 2) CAPSULE: 15 days supply | Qty: 30 | Fill #2 | Status: AC

## 2020-04-10 MED FILL — LENVIMA 20 MG/DAY (10 MG X 2) CAPSULE: ORAL | 15 days supply | Qty: 30 | Fill #2

## 2020-04-15 ENCOUNTER — Encounter
Admit: 2020-04-15 | Discharge: 2020-04-16 | Payer: PRIVATE HEALTH INSURANCE | Attending: Hematology & Oncology | Primary: Hematology & Oncology

## 2020-04-15 ENCOUNTER — Encounter: Admit: 2020-04-15 | Discharge: 2020-04-16 | Payer: PRIVATE HEALTH INSURANCE

## 2020-04-15 DIAGNOSIS — C080 Malignant neoplasm of submandibular gland: Principal | ICD-10-CM

## 2020-04-15 DIAGNOSIS — Z5112 Encounter for antineoplastic immunotherapy: Principal | ICD-10-CM

## 2020-04-15 DIAGNOSIS — C760 Malignant neoplasm of head, face and neck: Principal | ICD-10-CM

## 2020-04-15 DIAGNOSIS — Z6826 Body mass index (BMI) 26.0-26.9, adult: Secondary | ICD-10-CM | POA: Diagnosis not present

## 2020-04-15 LAB — COMPREHENSIVE METABOLIC PANEL
ALBUMIN: 3.9 g/dL (ref 3.4–5.0)
ALKALINE PHOSPHATASE: 144 U/L — ABNORMAL HIGH (ref 46–116)
ALT (SGPT): 19 U/L (ref 10–49)
ANION GAP: 10 mmol/L (ref 5–14)
AST (SGOT): 25 U/L (ref <=34)
BILIRUBIN TOTAL: 0.4 mg/dL (ref 0.3–1.2)
BLOOD UREA NITROGEN: 16 mg/dL (ref 9–23)
BUN / CREAT RATIO: 21
CALCIUM: 9.6 mg/dL (ref 8.7–10.4)
CHLORIDE: 103 mmol/L (ref 98–107)
CO2: 24 mmol/L (ref 20.0–31.0)
CREATININE: 0.77 mg/dL
EGFR CKD-EPI AA MALE: >90 mL/min/{1.73_m2} (ref >=60)
EGFR CKD-EPI NON-AA MALE: >90 mL/min/{1.73_m2} (ref >=60)
GLUCOSE RANDOM: 92 mg/dL (ref 70–179)
POTASSIUM: 4 mmol/L (ref 3.4–4.5)
PROTEIN TOTAL: 7.9 g/dL (ref 5.7–8.2)
SODIUM: 137 mmol/L (ref 135–145)

## 2020-04-15 LAB — CBC W/ AUTO DIFF
BASOPHILS ABSOLUTE COUNT: 0 10*9/L (ref 0.0–0.1)
BASOPHILS RELATIVE PERCENT: 0.4 %
EOSINOPHILS ABSOLUTE COUNT: 0.1 10*9/L (ref 0.0–0.4)
EOSINOPHILS RELATIVE PERCENT: 1.6 %
HEMATOCRIT: 43.2 % (ref 41.0–53.0)
HEMOGLOBIN: 14 g/dL (ref 13.5–17.5)
LARGE UNSTAINED CELLS: 2 % (ref 0–4)
LYMPHOCYTES ABSOLUTE COUNT: 1.4 10*9/L — ABNORMAL LOW (ref 1.5–5.0)
LYMPHOCYTES RELATIVE PERCENT: 27.1 %
MEAN CORPUSCULAR HEMOGLOBIN CONC: 32.5 g/dL (ref 31.0–37.0)
MEAN CORPUSCULAR HEMOGLOBIN: 28.1 pg (ref 26.0–34.0)
MEAN CORPUSCULAR VOLUME: 86.5 fL (ref 80.0–100.0)
MEAN PLATELET VOLUME: 8.6 fL (ref 7.0–10.0)
MONOCYTES ABSOLUTE COUNT: 0.3 10*9/L (ref 0.2–0.8)
MONOCYTES RELATIVE PERCENT: 5.3 %
NEUTROPHILS ABSOLUTE COUNT: 3.3 10*9/L (ref 2.0–7.5)
NEUTROPHILS RELATIVE PERCENT: 63.4 %
PLATELET COUNT: 293 10*9/L (ref 150–440)
RED BLOOD CELL COUNT: 4.99 10*12/L (ref 4.50–5.90)
RED CELL DISTRIBUTION WIDTH: 17.1 % — ABNORMAL HIGH (ref 12.0–15.0)
WBC ADJUSTED: 5.2 10*9/L (ref 4.5–11.0)

## 2020-04-15 LAB — T4, FREE: FREE T4: 1.34 ng/dL (ref 0.89–1.76)

## 2020-04-15 LAB — TSH: THYROID STIMULATING HORMONE: 8.589 u[IU]/mL — ABNORMAL HIGH (ref 0.550–4.780)

## 2020-04-15 MED ORDER — GABAPENTIN 300 MG CAPSULE
ORAL_CAPSULE | 2 refills | 0 days | Status: CP
Start: 2020-04-15 — End: ?

## 2020-04-15 MED ADMIN — sodium chloride (NS) 0.9 % infusion: 100 mL/h | INTRAVENOUS | @ 16:00:00 | Stop: 2020-04-16

## 2020-04-15 MED ADMIN — pembrolizumab (KEYTRUDA) 400 mg in sodium chloride (NS) 0.9 % 50 mL IVPB: 400 mg | INTRAVENOUS | @ 16:00:00 | Stop: 2020-04-15

## 2020-04-15 NOTE — Unmapped (Signed)
Pt received in NAD. PIV accessed by triage nurse and positive blood return noted prior to initiation of treatment. Treatment completed without difficulty. PIV flushed and deacessed. Patient discharged ambulatory in NAD.

## 2020-04-15 NOTE — Unmapped (Signed)
PIV placed.  Labs drawn & sent for analysis. To next appt.  Care provided by Judson Roch LPN.

## 2020-04-15 NOTE — Unmapped (Signed)
Reason for visit: Jason Frey was initially seen at the request of Dr. Bertis Ruddy for a head/neck medical oncology opinion regarding ACC.    HPI:  Jason Frey is an otherwise healthy now 44 year old man who was diagnosed with ACC in 2016.  It was tested by [facility] NGS and found to not have mutations in AR, EGFR, ERBB2 or KIT.  He was treated with cisplatin, cyclophosphamide and doxorubicin for six cycles, with SD as best response.  He notes that this was rough on him, with heart problems, treated with steroids, and non-painful neuropathy.  He was then treated with 2C Cbp/Vin with PD so switched to levatinib/pembro.  Today C1 pembro.  Started levatinib several weeks ago.  No side effects other than occasional HA.  Gabapentin 300 helping.  Wants to know how long he will live.    ROS:  10 systems were reviewed and found to be within normal limits except as described in the HPI.    MEDICATIONS:  Current Outpatient Medications on File Prior to Visit   Medication Sig Dispense Refill   ??? gabapentin (NEURONTIN) 300 MG capsule Take 1 capsule (300 mg total) by mouth Three (3) times a day. 90 capsule 2   ??? lenvatinib (LENVIMA) 20 mg/day (10 mg x 2) cap Take 2 capsules (20 mg) by mouth daily. 60 capsule 5   ??? LORazepam (ATIVAN) 1 MG tablet Take 1 tablet (1 mg total) by mouth every eight (8) hours as needed for anxiety. 30 tablet 0   ??? OLANZapine (ZYPREXA) 7.5 MG tablet Take 1 tablet (7.5 mg total) by mouth nightly. 30 tablet 2   ??? ondansetron (ZOFRAN) 8 MG tablet Take 1 tablet (8 mg total) by mouth every eight (8) hours as needed for nausea. 30 tablet 2   ??? sildenafiL (VIAGRA) 50 MG tablet Take 1 tablet (50 mg total) by mouth daily as needed for erectile dysfunction. 20 tablet 0     No current facility-administered medications on file prior to visit.       ALLERGIES:   Allergies   Allergen Reactions   ??? Hydrocodone Itching       PMH:  Sailolithiasis of submandibular gland  ACC    SOCIAL HISTORY:  Quit smoking in 2013    FAMILY HISTORY:  Adopted    PHYSICAL EXAMINATION:  BP 120/90  - Pulse 61  - Temp 37.1 ??C (98.7 ??F) (Temporal)  - Resp 16  - Wt 88.1 kg (194 lb 4.8 oz)  - SpO2 100%  - BMI 26.35 kg/m??    GENERAL: Pleasant male, not in acute distress  AFFECT: Calm, pleasant  Neuro:  Alert and oriented, speech clear, gait steady    LABS:  Results for Jason Frey, Jason Frey (MRN [id number]) as of 04/15/2020 08:51   Ref. Range 04/15/2020 07:54   WBC Latest Ref Range: 4.5 - 11.0 10*9/L 5.2   RBC Latest Ref Range: 4.50 - 5.90 10*12/L 4.99   HGB Latest Ref Range: 13.5 - 17.5 g/dL 81.1   HCT Latest Ref Range: 41.0 - 53.0 % 43.2   MCV Latest Ref Range: 80.0 - 100.0 fL 86.5   MCH Latest Ref Range: 26.0 - 34.0 pg 28.1   MCHC Latest Ref Range: 31.0 - 37.0 g/dL 91.4   RDW Latest Ref Range: 12.0 - 15.0 % 17.1 (H)   MPV Latest Ref Range: 7.0 - 10.0 fL 8.6   Platelet Latest Ref Range: 150 - 440 10*9/L 293   Neutrophils % Latest Units: % 63.4  Lymphocytes % Latest Units: % 27.1   Monocytes % Latest Units: % 5.3   Eosinophils % Latest Units: % 1.6   Basophils % Latest Units: % 0.4   Absolute Neutrophils Latest Ref Range: 2.0 - 7.5 10*9/L 3.3   Absolute Lymphocytes Latest Ref Range: 1.5 - 5.0 10*9/L 1.4 (L)   Absolute Monocytes  Latest Ref Range: 0.2 - 0.8 10*9/L 0.3   Absolute Eosinophils Latest Ref Range: 0.0 - 0.4 10*9/L 0.1   Absolute Basophils  Latest Ref Range: 0.0 - 0.1 10*9/L 0.0   Microcytosis Latest Ref Range: Not Present  Slight (A)   Anisocytosis Latest Ref Range: Not Present  Slight (A)   Hypochromasia Latest Ref Range: Not Present  Slight (A)   Large Unstained Cells Latest Ref Range: 0 - 4 % 2   Sodium Latest Ref Range: 135 - 145 mmol/L 137   Potassium Latest Ref Range: 3.4 - 4.5 mmol/L 4.0   Chloride Latest Ref Range: 98 - 107 mmol/L 103   CO2 Latest Ref Range: 20.0 - 31.0 mmol/L 24.0   Bun Latest Ref Range: 9 - 23 mg/dL 16   Creatinine Latest Ref Range: 0.60 - 1.10 mg/dL 1.61   BUN/Creatinine Ratio Unknown 21   EGFR CKD-EPI African American, Male Latest Ref Range: >=60 mL/min/1.85m2 >90   EGFR CKD-EPI Non-African American, Male Latest Ref Range: >=60 mL/min/1.46m2 >90   Anion Gap Latest Ref Range: 5 - 14 mmol/L 10   Glucose Latest Ref Range: 70 - 179 mg/dL 92   Calcium Latest Ref Range: 8.7 - 10.4 mg/dL 9.6   Albumin Latest Ref Range: 3.4 - 5.0 g/dL 3.9   Total Protein Latest Ref Range: 5.7 - 8.2 g/dL 7.9   Total Bilirubin Latest Ref Range: 0.3 - 1.2 mg/dL 0.4   AST Latest Ref Range: <=34 U/L 25   ALT Latest Ref Range: 10 - 49 U/L 19   Alkaline Phosphatase Latest Ref Range: 46 - 116 U/L 144 (H)   TSH Latest Ref Range: 0.550 - 4.780 uIU/mL 8.589 (H)   Free T4 Latest Ref Range: 0.89 - 1.76 ng/dL 0.96       RADIOLOGY:  Last 03/04/20    PATHOLOGY:  ACC  Androgen receptor: focally positive, approximately 10% of tumor cells, 2+intensity  Estrogen receptor: negative (less than 1% of tumor cells staining, 1+ intensity)  Her-2/neu: Negative  NGS: TERT    ASSESSMENT AND PLAN: 38M w/stage IV ACC, progressive, s/p 2C CbP/Vin with progression so changed to pembro/levantinib.    ACC: Levatinib well tolerated.  Laboratory results were reviewed and found to be acceptable.  Bl imaging reviewed.  C1 ordered.  Fu labs/scans ordered.    Neuropathic pain: Gabapentin titrated up.    Asymptomatic hypothyroidism: TSH up by FT4 wnl.  Will monitor.    Counseling: Discussed recent imaging pace and bulk.  Will discuss prognosis again @ next scans.    Insurance loss: Gwenevere Abbot consulted and spoke w/him.  Joyce Gross PharmD informed.    FU: Pembro Q6w dose written.  Intervals adjusted in orders.  6w w/labs, scans, likely next C.

## 2020-04-17 NOTE — Unmapped (Signed)
[facility] Refill Coordination Note    Specialty Medication(s) to be Shipped:   Hematology/Oncology: Jason Frey    Other medication(s) to be shipped: No additional medications requested for fill at this time     Jason Frey, DOB: [birth year]  Phone: [phone] (home)       All above HIPAA information was verified with patient.     Was a Nurse, learning disability used for this call? No    Completed refill call assessment today to schedule patient's medication shipment from the [facility] [phone]).       Specialty medication(s) and dose(s) confirmed: Regimen is correct and unchanged.   Changes to medications: Jason Frey reports no changes at this time.  Changes to insurance: No  Questions for the pharmacist: No    Confirmed patient received Welcome Packet with first shipment. The patient will receive a drug information handout for each medication shipped and additional FDA Medication Guides as required.       DISEASE/MEDICATION-SPECIFIC INFORMATION        N/A    SPECIALTY MEDICATION ADHERENCE     Medication Adherence    Patient reported X missed doses in the last month: 0  Specialty Medication: Lenvima 20mg /day  Patient is on additional specialty medications: No        Lenvima 20 mg/day: 16 days of medicine on hand     SHIPPING     Shipping address confirmed in Epic.     Delivery Scheduled: Yes, Expected medication delivery date: 04/25/2020.     Medication will be delivered via UPS to the prescription address in Epic WAM.    Jason Frey [facility] Specialty Technician

## 2020-04-24 MED FILL — LENVIMA 20 MG/DAY (10 MG X 2) CAPSULE: 15 days supply | Qty: 30 | Fill #3 | Status: AC

## 2020-04-24 MED FILL — LENVIMA 20 MG/DAY (10 MG X 2) CAPSULE: ORAL | 15 days supply | Qty: 30 | Fill #3

## 2020-04-29 NOTE — Unmapped (Signed)
[facility] Clinical Assessment & Refill Coordination Note    Jason Frey, DOB: March 23, 1976  Phone: [phone] (home)     All above HIPAA information was verified with patient.     Was a Nurse, learning disability used for this call? No    Specialty Medication(s):   Hematology/Oncology: Jason Frey     Current Outpatient Medications   Medication Sig Dispense Refill   ??? gabapentin (NEURONTIN) 300 MG capsule Increase to 2 pills at night for three days then increase to 3 pills at night. Continue 1 pill morning and mid day. 150 capsule 2   ??? lenvatinib (LENVIMA) 20 mg/day (10 mg x 2) cap Take 2 capsules (20 mg) by mouth daily. 60 capsule 5   ??? LORazepam (ATIVAN) 1 MG tablet Take 1 tablet (1 mg total) by mouth every eight (8) hours as needed for anxiety. 30 tablet 0   ??? OLANZapine (ZYPREXA) 7.5 MG tablet Take 1 tablet (7.5 mg total) by mouth nightly. 30 tablet 2   ??? ondansetron (ZOFRAN) 8 MG tablet Take 1 tablet (8 mg total) by mouth every eight (8) hours as needed for nausea. 30 tablet 2   ??? sildenafiL (VIAGRA) 50 MG tablet Take 1 tablet (50 mg total) by mouth daily as needed for erectile dysfunction. 20 tablet 0     No current facility-administered medications for this visit.        Changes to medications: Jason Frey reports no changes at this time.    Allergies   Allergen Reactions   ??? Hydrocodone Itching       Changes to allergies: No    SPECIALTY MEDICATION ADHERENCE     Lenvima 20 mg:  10 days of medicine on hand     Medication Adherence    Patient reported X missed doses in the last month: 0  Specialty Medication: Lenvima 20mg           Specialty medication(s) dose(s) confirmed: Regimen is correct and unchanged.     Are there any concerns with adherence? No    Adherence counseling provided? Not needed    CLINICAL MANAGEMENT AND INTERVENTION      Clinical Benefit Assessment:    Do you feel the medicine is effective or helping your condition? Yes    Clinical Benefit counseling provided? Not needed    Adverse Effects Assessment:    Are you experiencing any side effects? No    Are you experiencing difficulty administering your medicine? No    Quality of Life Assessment:    How many days over the past month did your condition/medication  keep you from your normal activities? For example, brushing your teeth or getting up in the morning. 0    Have you discussed this with your provider? Not needed    Therapy Appropriateness:    Is therapy appropriate? Yes, therapy is appropriate and should be continued    DISEASE/MEDICATION-SPECIFIC INFORMATION      N/A    PATIENT SPECIFIC NEEDS     - Does the patient have any physical, cognitive, or cultural barriers? No    - Is the patient high risk? Yes, patient is taking oral chemotherapy. Appropriateness of therapy as been assessed    - Does the patient require a Care Management Plan? No     - Does the patient require physician intervention or other additional services (i.e. nutrition, smoking cessation, social work)? No      SHIPPING     Specialty Medication(s) to be Shipped:   Hematology/Oncology:  Lenvima    Other medication(s) to be shipped: No additional medications requested for fill at this time     Changes to insurance: No    Delivery Scheduled: Yes, Expected medication delivery date: 05/09/20.     Medication will be delivered via UPS to the confirmed prescription address in Jason Frey.    The patient will receive a drug information handout for each medication shipped and additional FDA Medication Guides as required.  Verified that patient has previously received a Conservation officer, historic buildings.    All of the patient's questions and concerns have been addressed.    Jason Frey   [facility] Specialty Pharmacist

## 2020-05-05 DIAGNOSIS — C080 Malignant neoplasm of submandibular gland: Principal | ICD-10-CM

## 2020-05-05 DIAGNOSIS — C760 Malignant neoplasm of head, face and neck: Principal | ICD-10-CM

## 2020-05-08 MED FILL — LENVIMA 20 MG/DAY (10 MG X 2) CAPSULE: ORAL | 30 days supply | Qty: 60 | Fill #4

## 2020-05-08 MED FILL — LENVIMA 20 MG/DAY (10 MG X 2) CAPSULE: 30 days supply | Qty: 60 | Fill #4 | Status: AC

## 2020-05-27 ENCOUNTER — Encounter
Admit: 2020-05-27 | Discharge: 2020-05-27 | Payer: PRIVATE HEALTH INSURANCE | Attending: Hematology & Oncology | Primary: Hematology & Oncology

## 2020-05-27 ENCOUNTER — Encounter: Admit: 2020-05-27 | Discharge: 2020-05-27 | Payer: PRIVATE HEALTH INSURANCE

## 2020-05-27 DIAGNOSIS — C760 Malignant neoplasm of head, face and neck: Principal | ICD-10-CM

## 2020-05-27 DIAGNOSIS — C080 Malignant neoplasm of submandibular gland: Principal | ICD-10-CM

## 2020-05-27 DIAGNOSIS — E039 Hypothyroidism, unspecified: Principal | ICD-10-CM

## 2020-05-27 DIAGNOSIS — R0781 Pleurodynia: Principal | ICD-10-CM

## 2020-05-27 DIAGNOSIS — R599 Enlarged lymph nodes, unspecified: Principal | ICD-10-CM

## 2020-05-27 DIAGNOSIS — Z87891 Personal history of nicotine dependence: Principal | ICD-10-CM

## 2020-05-27 DIAGNOSIS — C801 Malignant (primary) neoplasm, unspecified: Principal | ICD-10-CM

## 2020-05-27 DIAGNOSIS — G629 Polyneuropathy, unspecified: Principal | ICD-10-CM

## 2020-05-27 DIAGNOSIS — R0602 Shortness of breath: Principal | ICD-10-CM

## 2020-05-27 LAB — CBC W/ AUTO DIFF
BASOPHILS ABSOLUTE COUNT: 0 10*9/L (ref 0.0–0.1)
BASOPHILS RELATIVE PERCENT: 0.4 %
EOSINOPHILS ABSOLUTE COUNT: 0.1 10*9/L (ref 0.0–0.4)
EOSINOPHILS RELATIVE PERCENT: 1.1 %
HEMATOCRIT: 41.2 % (ref 41.0–53.0)
HEMOGLOBIN: 13.2 g/dL — ABNORMAL LOW (ref 13.5–17.5)
LARGE UNSTAINED CELLS: 1 % (ref 0–4)
LYMPHOCYTES ABSOLUTE COUNT: 1.5 10*9/L (ref 1.5–5.0)
LYMPHOCYTES RELATIVE PERCENT: 23.1 %
MEAN CORPUSCULAR HEMOGLOBIN CONC: 32.1 g/dL (ref 31.0–37.0)
MEAN CORPUSCULAR HEMOGLOBIN: 27.6 pg (ref 26.0–34.0)
MEAN CORPUSCULAR VOLUME: 86.1 fL (ref 80.0–100.0)
MEAN PLATELET VOLUME: 9 fL (ref 7.0–10.0)
MONOCYTES ABSOLUTE COUNT: 0.4 10*9/L (ref 0.2–0.8)
MONOCYTES RELATIVE PERCENT: 5.7 %
NEUTROPHILS ABSOLUTE COUNT: 4.4 10*9/L (ref 2.0–7.5)
NEUTROPHILS RELATIVE PERCENT: 68.3 %
PLATELET COUNT: 296 10*9/L (ref 150–440)
RED BLOOD CELL COUNT: 4.79 10*12/L (ref 4.50–5.90)
RED CELL DISTRIBUTION WIDTH: 15 % (ref 12.0–15.0)
WBC ADJUSTED: 6.5 10*9/L (ref 4.5–11.0)

## 2020-05-27 LAB — COMPREHENSIVE METABOLIC PANEL
ALBUMIN: 3.6 g/dL (ref 3.4–5.0)
ALKALINE PHOSPHATASE: 115 U/L (ref 46–116)
ALT (SGPT): 20 U/L (ref 10–49)
ANION GAP: 7 mmol/L (ref 5–14)
AST (SGOT): 27 U/L (ref <=34)
BILIRUBIN TOTAL: 0.3 mg/dL (ref 0.3–1.2)
BLOOD UREA NITROGEN: 14 mg/dL (ref 9–23)
BUN / CREAT RATIO: 17
CALCIUM: 9.7 mg/dL (ref 8.7–10.4)
CHLORIDE: 103 mmol/L (ref 98–107)
CO2: 27 mmol/L (ref 20.0–31.0)
CREATININE: 0.82 mg/dL
EGFR CKD-EPI AA MALE: >90 mL/min/{1.73_m2} (ref >=60)
EGFR CKD-EPI NON-AA MALE: >90 mL/min/{1.73_m2} (ref >=60)
GLUCOSE RANDOM: 84 mg/dL (ref 70–179)
POTASSIUM: 3.9 mmol/L (ref 3.4–4.5)
PROTEIN TOTAL: 7.3 g/dL (ref 5.7–8.2)
SODIUM: 137 mmol/L (ref 135–145)

## 2020-05-27 LAB — T4, FREE: FREE T4: 1.19 ng/dL (ref 0.89–1.76)

## 2020-05-27 LAB — TSH: THYROID STIMULATING HORMONE: 0.251 u[IU]/mL — ABNORMAL LOW (ref 0.550–4.780)

## 2020-05-27 MED ORDER — LORAZEPAM 1 MG TABLET
ORAL_TABLET | Freq: Three times a day (TID) | ORAL | 0 refills | 20 days | Status: CP | PRN
Start: 2020-05-27 — End: 2021-05-27

## 2020-05-27 MED ORDER — OXYCODONE 5 MG TABLET
ORAL_TABLET | Freq: Four times a day (QID) | ORAL | 0 refills | 3.00000 days | Status: CP | PRN
Start: 2020-05-27 — End: 2020-06-09

## 2020-05-27 MED ADMIN — pembrolizumab (KEYTRUDA) 400 mg in sodium chloride (NS) 0.9 % 50 mL IVPB: 400 mg | INTRAVENOUS | @ 21:00:00 | Stop: 2020-05-27

## 2020-05-27 MED ADMIN — iohexoL (OMNIPAQUE) 350 mg iodine/mL solution 100 mL: 100 mL | INTRAVENOUS | @ 14:00:00 | Stop: 2020-05-27

## 2020-05-27 NOTE — Unmapped (Unsigned)
1025 -  Blood drawn from existing PIV inserted by CT dept.by Kennyth Lose., RN.  Specimens sent to lab for analysis.

## 2020-05-27 NOTE — Unmapped (Signed)
Thoracic Oncology Follow-up Visit    Reason for visit: Juanantonio Stolar was initially seen at the request of Dr. Bertis Ruddy for a head/neck medical oncology opinion regarding ACC.    HPI:  Jason Frey is an otherwise healthy now 45 year old man who was diagnosed with ACC in 2016.  [facility] NGS was submitted and found not to have mutations in AR, EGFR, ERBB2 or KIT.  He was treated with cisplatin, cyclophosphamide and doxorubicin for six cycles, with SD as best response.  Notes this was tough on him, with heart problems, treated with steroids, and non-painful neuropathy.  He was then treated with 2C Cbp/Vin with PD so switched to levatinib/pembro.  He started levatinib first in 03/2020 and cycle 1 of pembrolizumab (400 mg dose) on 12/7.  He remains on levatinib and presents today for cycle 2 of pembro.  Imaging from this morning notable for POD with increased size of multiple pulmonary nodules.    Interval Hx:  Today pt is endorsing worsening SOB and intermittent severe left rib pain.  Pain at points can be unbearable (10/10 in severity) and significantly impacting QOL.  Currently only taking gabapentin 600 mg BID and very frequent Aleve (~6-10 tabs/day).  Had a sore throat about 3 weeks ago, tested negative for COVID, and this has since resolved.  Also endorsing and episode of possible palpitations last week in context of sharp left sided rib pain, but this has not recurred.  Fatigue has worsened, still doing routine activities, but only active for about 50% of the day.  Appetite has decreased, now eating about 2 meals/day.  Endorsed possible new double vision when focusing on objects like his watch, which has developed over the past few weeks, exact timeline is a little unclear, but did not develop acutely.  Denied associated HA.  Furthermore, denied CP, cough, abd pain, n/v/d/c, dysuria, or neuropathy.  Endorsed compliance with lenvatinib dosing.    ROS:  10 systems were reviewed and found to be within normal limits except as described in the HPI.    MEDICATIONS:  Current Outpatient Medications on File Prior to Visit   Medication Sig Dispense Refill   ??? gabapentin (NEURONTIN) 300 MG capsule Increase to 2 pills at night for three days then increase to 3 pills at night. Continue 1 pill morning and mid day. 150 capsule 2   ??? lenvatinib (LENVIMA) 20 mg/day (10 mg x 2) cap Take 2 capsules (20 mg) by mouth daily. 60 capsule 5   ??? LORazepam (ATIVAN) 1 MG tablet Take 1 tablet (1 mg total) by mouth every eight (8) hours as needed for anxiety. 30 tablet 0   ??? OLANZapine (ZYPREXA) 7.5 MG tablet Take 1 tablet (7.5 mg total) by mouth nightly. 30 tablet 2   ??? ondansetron (ZOFRAN) 8 MG tablet Take 1 tablet (8 mg total) by mouth every eight (8) hours as needed for nausea. 30 tablet 2   ??? sildenafiL (VIAGRA) 50 MG tablet Take 1 tablet (50 mg total) by mouth daily as needed for erectile dysfunction. 20 tablet 0     No current facility-administered medications on file prior to visit.     ALLERGIES:   Allergies   Allergen Reactions   ??? Hydrocodone Itching     PMH:  Sailolithiasis of submandibular gland  ACC    SOCIAL HISTORY:  Quit smoking in 2013    FAMILY HISTORY:  Adopted    PHYSICAL EXAMINATION:  BP 128/89  - Pulse 68  - Temp 36.9 ??C (  98.5 ??F) (Temporal)  - Resp 16  - Wt 84.8 kg (187 lb)  - BMI 25.36 kg/m??    GENERAL: Pleasant male, not in acute distress, although chronically ill appearing  HEENT: [place]/AT, normal conjunctiva  NECK: No palpable cervical or supraclavicular LAD  CV: RRR, no m/r/g  PULM: CTA bilaterally  ABD: +BS, NT/ND  AFFECT: Calm, pleasant  NEURO: No focal deficits, gait steady    LABS:  Results for TRA, WILEMON (MRN [id number]) as of 04/15/2020 08:51   Ref. Range 04/15/2020 07:54   WBC Latest Ref Range: 4.5 - 11.0 10*9/L 5.2   RBC Latest Ref Range: 4.50 - 5.90 10*12/L 4.99   HGB Latest Ref Range: 13.5 - 17.5 g/dL 81.1   HCT Latest Ref Range: 41.0 - 53.0 % 43.2   MCV Latest Ref Range: 80.0 - 100.0 fL 86.5   MCH Latest Ref Range: 26.0 - 34.0 pg 28.1   MCHC Latest Ref Range: 31.0 - 37.0 g/dL 91.4   RDW Latest Ref Range: 12.0 - 15.0 % 17.1 (H)   MPV Latest Ref Range: 7.0 - 10.0 fL 8.6   Platelet Latest Ref Range: 150 - 440 10*9/L 293   Neutrophils % Latest Units: % 63.4   Lymphocytes % Latest Units: % 27.1   Monocytes % Latest Units: % 5.3   Eosinophils % Latest Units: % 1.6   Basophils % Latest Units: % 0.4   Absolute Neutrophils Latest Ref Range: 2.0 - 7.5 10*9/L 3.3   Absolute Lymphocytes Latest Ref Range: 1.5 - 5.0 10*9/L 1.4 (L)   Absolute Monocytes  Latest Ref Range: 0.2 - 0.8 10*9/L 0.3   Absolute Eosinophils Latest Ref Range: 0.0 - 0.4 10*9/L 0.1   Absolute Basophils  Latest Ref Range: 0.0 - 0.1 10*9/L 0.0   Microcytosis Latest Ref Range: Not Present  Slight (A)   Anisocytosis Latest Ref Range: Not Present  Slight (A)   Hypochromasia Latest Ref Range: Not Present  Slight (A)   Large Unstained Cells Latest Ref Range: 0 - 4 % 2   Sodium Latest Ref Range: 135 - 145 mmol/L 137   Potassium Latest Ref Range: 3.4 - 4.5 mmol/L 4.0   Chloride Latest Ref Range: 98 - 107 mmol/L 103   CO2 Latest Ref Range: 20.0 - 31.0 mmol/L 24.0   Bun Latest Ref Range: 9 - 23 mg/dL 16   Creatinine Latest Ref Range: 0.60 - 1.10 mg/dL 7.82   BUN/Creatinine Ratio Unknown 21   EGFR CKD-EPI African American, Male Latest Ref Range: >=60 mL/min/1.83m2 >90   EGFR CKD-EPI Non-African American, Male Latest Ref Range: >=60 mL/min/1.65m2 >90   Anion Gap Latest Ref Range: 5 - 14 mmol/L 10   Glucose Latest Ref Range: 70 - 179 mg/dL 92   Calcium Latest Ref Range: 8.7 - 10.4 mg/dL 9.6   Albumin Latest Ref Range: 3.4 - 5.0 g/dL 3.9   Total Protein Latest Ref Range: 5.7 - 8.2 g/dL 7.9   Total Bilirubin Latest Ref Range: 0.3 - 1.2 mg/dL 0.4   AST Latest Ref Range: <=34 U/L 25   ALT Latest Ref Range: 10 - 49 U/L 19   Alkaline Phosphatase Latest Ref Range: 46 - 116 U/L 144 (H)   TSH Latest Ref Range: 0.550 - 4.780 uIU/mL 8.589 (H)   Free T4 Latest Ref Range: 0.89 - 1.76 ng/dL 9.56       RADIOLOGY:    CT Chest (05/27/20):  IMPRESSION:  Progression of disease as evidenced by interval increase in  size of pulmonary nodules and masses.    CT AP (05/27/20):  IMPRESSION:  - No evidence of metastatic disease in the abdomen or pelvis.  - Small bowel-small bowel intussusception in the left upper quadrant, likely transient. Recommend attention on follow-up.  - Please see same day chest CT for findings above the diaphragm.    CT Neck (05/27/20):  IMPRESSION:  - No evidence of local recurrence or metastatic disease in the neck.  - Stable subcentimeter right level IIb lymph node going back multiple studies.  - Please also refer to the separately dictated CT chest.    PATHOLOGY:  ACC  Androgen receptor: focally positive, approximately 10% of tumor cells, 2+intensity  Estrogen receptor: negative (less than 1% of tumor cells staining, 1+ intensity)  Her-2/neu: Negative  NGS: TERT    ASSESSMENT AND PLAN: 13M w/stage IV ACC, progressive, s/p 2C CbP/Vin with progression, changed to pembro/levantinib with POD on imaging today.  We discussed that it's hard to say if this is true progression on pembro, as he only received 1 dose prior to repeat imaging (notably was on levantinib for a longer duration).  Therefore, especially given lack of other efficacious alternatives, it would be reasonable to continue with pembro at this time and can also continue with lenvantinib in this context as he does not appear to be experiencing much toxicity.  We also discussed alternative options including phase 1 clinical trials and transitioning GOC to focus on comfort and discussed hospice care.  At present pt would like to remain on treatment and would like to continue with pembro/levantinib.  He is also open to potential phase 1 options, but expressed that he is unable to travel far (for example unable to travel to [facility] in [place]) due to lack of financial means.  We unfortunately don't have an available spot on a clinical trial here, but discussed that we would look into options at other regional medical centers.  Pt asked about prognosis, discussed this is impossible to know, but could be over a year (but also possibly less than that).    ACC: Levatinib/Pembro well tolerated.  Laboratory results were reviewed and found to be acceptable.  Suspect worsening symptoms detailed above in the interval history are related to his malignancy and not to therapy.  - Proceed with cycle 2 of Pembro today  - RTC in 6 weeks with repeat CT Chest.  If further POD at that time will then need to consider phase 1 trial vs palliative/hospice care.  - Will explore potential trial options in the interim.    Pain: Worsening severe left rib pain.  Will start prn oxycodone and refer to Rad Onc for possible palliative RT.  - Start Oxy 5 mg Q6H prn (advised pt to call if inadequate pain control)  - Discussed need for bowel regimen with goal of 1 BM/day (pt has miralax at home)  - Cont with gabapentin  - f/u with Rad Onc    Anxiety: Refilled ativan prescription.  Advised caution with taking in combination with oxycodone.    Neuropathic pain: Well controled today, cont gabapentin.    Asymptomatic hypothyroidism: TSH now low, FT4 remains wnl.  Will monitor.    Insurance loss: Gwenevere Abbot consulted and spoke w/him.  Joyce Gross PharmD previously informed.  Advised pt to call if facing financial hardship or trouble affording prescribed meds.    Pt was seen and discussed with Dr. Janann August.    Doree Barthel, MD  Medical Oncology Fellow, [facility]

## 2020-05-27 NOTE — Unmapped (Addendum)
It was a pleasure to see you in clinic today.  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    We will also start a new medication called oxycodone which you can take every 6 hours as needed for pain.  If this is not controlling your pain please call.    Take miralax as needed for 1 bowel movement a day.    We will see you back in clinic in 6 weeks with repeat CT scans.    For appointments & questions Monday through Friday 8 AM???5 PM     Please call [phone] or Toll free [phone]    For urgent clinical needs on Nights, Weekends or Holidays  Call [phone] and ask for the oncologist on call.     For appointment changes please contact during normal business hours.     Please visit [email or web address].cz, a resource created just for family members and caregivers.  This website lists support services, how and where to ask for help. It has tools to assist you as you help [place] care for your loved one.    [facility]  [address]  [place], [place] [address]  [email or web address]

## 2020-05-28 NOTE — Unmapped (Signed)
PT in clinic for Keytruda infusion, PT tolerated infusion with no issues noted. PIV removed with tip intact.

## 2020-05-29 ENCOUNTER — Ambulatory Visit
Admit: 2020-05-29 | Discharge: 2020-06-09 | Payer: PRIVATE HEALTH INSURANCE | Attending: Radiation Oncology | Primary: Radiation Oncology

## 2020-05-29 DIAGNOSIS — Z9889 Other specified postprocedural states: Principal | ICD-10-CM

## 2020-05-29 DIAGNOSIS — C080 Malignant neoplasm of submandibular gland: Principal | ICD-10-CM

## 2020-05-29 DIAGNOSIS — Z9221 Personal history of antineoplastic chemotherapy: Principal | ICD-10-CM

## 2020-05-29 DIAGNOSIS — C7801 Secondary malignant neoplasm of right lung: Principal | ICD-10-CM

## 2020-05-29 DIAGNOSIS — Z87891 Personal history of nicotine dependence: Principal | ICD-10-CM

## 2020-05-29 DIAGNOSIS — C760 Malignant neoplasm of head, face and neck: Principal | ICD-10-CM

## 2020-05-29 DIAGNOSIS — C7802 Secondary malignant neoplasm of left lung: Principal | ICD-10-CM

## 2020-05-29 NOTE — Unmapped (Signed)
RADIATION ONCOLOGY TELEHEALTH CONSULTATION NOTE    Encounter Date: 05/29/2020  Patient Name: Jason Frey  Medical Record Number: [id number]  Patient identity verbally confirmed: Yes.  Verbal consent for telemedicine encounter: Yes.  Location of patient:  [address]  [place] [address]   Location of provider [place], [place]): [place], [place]  Encounter medium: video and audio  Software platform: Doximity  Attendees (and relation to patient): Patient, Dr. Garner Gavel, Dr. 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     RECOMMENDATIONS:  1. CANCER TREATMENT: Recommend palliative intent RT to left rib lesion.   2. RADIATION PLANNING: Patient lives in [place] and prefers treatment closer to home. We will submit a referral to [facility] Radiation Oncology.    INFORMED CONSENT:   Not obtained    REASON FOR CONSULTATION:   Jason Frey is a 45 y.o. male who is seen in consultation at the request of Molli Barrows, MD for an opinion regarding the use of radiation therapy in the treatment of his metastatic adenoid cystic carcinoma.    DIAGNOSIS:  1. 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    HISTORY OF PRESENT ILLNESS:  Information pertinent to today's evaluation are the following:    2016: Diagnosed with acinic cell carcinoma. Tested by [employer] and found to not have mutations in AR, EGFR, ERBB2 or KIT.     07/12/2014: Resection of right submandibular tumor, Adenoid cystic carcinoma, +PNI, grade I / III,  (pT2NXMX)    08/12/2014: Right limited radical neck dissection (pT2N0MX), Adenoid cystic carcinoma.     07/09/2016: Resection of local relapse at right neck (rpT2NXM1)    08/06/2016: CT chest showed new onset of bilateral lung nodules.    03/25/17: Biopsy LLL lung nodule, metastatic ACC.     05/16/17 - 10/12/17: Cisplatin, cyclophosphamide, doxorubicin x6 cycles (stable disease)    03/09/18 - 06/09/18: Lenvatinib 24mg  PO QD (progression on CT of the chest noted 06/08/2018). Last dose lenvatinib 06/09/2018    01/22/2020 - 02/19/2020: Transitioned care to [facility] with Dr. Eloise Harman (Previously at [facility], [facility]).Systemic therapy with carboplatin/vinorelbine (2 cycles)    03/04/2020: Imaging     - CT neck soft tissue with contrast showed increase in size of right level 2A node  - CT abdomen/pelvis with contrast showed stable suspected hemangioma of liver, no new metastatic disease  - CT chest with contrast showed interval disease progression with worsening pulmonary, pleural and nodal metastasis.    04/15/2020: Systemic therapy transitioned to pembrolizumab. Also started lenvatinib (anti-VEGF).    05/27/2020: Imaging    - CT neck soft tissue with contrast showed no evidence of local recurrence or metastatic disease in the neck. Stable subcentimeter right level IIb lymph node   -  CT abdomen/pelvis with contrast showed no evidence of metastatic disease in the abdomen or pelvis.  - CT chest with contrast showed progression of disease as evidenced by interval increase in size of pulmonary nodules and masses.    At last follow up with Medical Oncology, patient noted to have worsening left rib pain and referred for consideration of palliative RT. He is currently using NSAIDs, gabapentin for pain, just started using oxycodone Oxy 5 mg Q6H prn. He describes pain in the mid to lower thorax, medial to axillary midline and notes that he can palpate a raised lesion at this site.    I have reviewed old/outside medical records (please see above for summary).    REVIEW OF SYSTEMS:    A comprehensive review of 10 systems was negative except for pertinent positives noted in HPI.    Past Medical History:   Diagnosis Date   ??? Medical history reviewed with no changes 08/09/2018    per pt      Prior Radiation Therapy:no  Pacemaker: no  Pregnancy status: No; male patient  Collagen Vascular Disease:no    Past Surgical History:   Procedure Laterality Date   ??? PR THORACOSCOPY W/THERA WEDGE RESEXN INITIAL UNILAT Left 08/11/2018    Procedure: Thoracoscopy, Surgical; With Therapeutic Wedge Resection (Eg, Mass, Nodule) Initial Unilateral;  Surgeon: Evert Kohl, MD;  Location: MAIN OR [facility];  Service: Thoracic   ??? PR THORACOSCOPY W/THERA WEDGE RESEXN INITIAL UNILAT Right 08/18/2018    Procedure: URGENT THORACOSCOPY, SURGICAL; WITH THERAPEUTIC WEDGE RESECTION (EG, MASS, NODULE) INITIAL UNILATERAL;  Surgeon: Evert Kohl, MD;  Location: MAIN OR [facility];  Service: Thoracic      Family History:  Maternal grandmother - colon cancer  Patient was adopted - full family history not known  1 child - son; healthy and well     Social History     Occupational History   ??? Not on file   Tobacco Use   ??? Smoking status: Former Smoker     Packs/day: 0.50     Years: 15.00     Pack years: 7.50     Types: Cigarettes   ??? Smokeless tobacco: Former Estate agent and Sexual Activity   ??? Alcohol use: Not on file     Comment: 1 beer a month   ??? Drug use: Not Currently   ??? Sexual activity: Not on file   Lives in [place], [place]  No longer working. Previously was a Passenger transport manager  Lives with girlfriend and 2 step-daughters      ALLERGIES/MEDICATIONS:  Reviewed in EPIC    PHYSICAL EXAM:     Vital Signs for this encounter:  BSA: There is no height or weight on file to calculate BSA.  There were no vitals taken for this visit.  Pain:  Pain Score (0 - 10):    Pain Location:     Karnofsky/Lansky Performance Status: 70, Cares for self; unable to carry on normal activity or to do active work (ECOG equivalent 1)  General/Constitutional:   No acute distress, alert and oriented X 4     SPECIAL DIAGNOSTIC PROCEDURES:   Not Performed    RADIOLOGY:  Imaging was personally reviewed as detailed in the history (see above).      PATHOLOGY:   Pathology was personally reviewed as detailed in the HPI.     LABS:  Pertinent labs reviewed.    _____________________  Fuller Song, MD, PGY-5  [phone]  Radiation Oncology  [facility]    This was a telehealth service where a resident was involved. As the attending physician, I spent 5 minutes in medical discussion with the patient via real-time audio and video, participating in the key portions of the service.I spent an additional 5 minutes on pre- and post-visit activities which were specific to the patient and included reviewing the patient???s medical records, lab results, imaging results, and other pertinent records. I reviewed the resident's note and I agree with the resident's findings and plan.    Bhishamjit S. Despina Hick, MD  Associate Professor  Director of Patient Safety and Quality  Department of Radiation Oncology  [facility]  [address], [address]  [place], [place] [address]  O: [phone]  06/02/20 12:08 PM

## 2020-06-02 DIAGNOSIS — C080 Malignant neoplasm of submandibular gland: Principal | ICD-10-CM

## 2020-06-02 MED ORDER — OLANZAPINE 7.5 MG TABLET
ORAL_TABLET | ORAL | 0 refills | 0.00000 days | Status: CP
Start: 2020-06-02 — End: 2020-06-04

## 2020-06-02 NOTE — Unmapped (Signed)
Please refill as appropriate 

## 2020-06-02 NOTE — Unmapped (Signed)
This patient has been disenrolled from the [facility] specialty pharmacy services due to enrollment in a manufacturer assistance program that sends medicine directly to the patient.    Rollen Sox  [facility] Specialty Pharmacist

## 2020-06-02 NOTE — Unmapped (Signed)
Please refill if appropriate

## 2020-06-02 NOTE — Unmapped (Signed)
Encounter addended by: Mamie Nick, MD on: 06/02/2020 12:08 PM   Actions taken: Clinical Note Signed, Level of Service modified, Letter saved

## 2020-06-04 DIAGNOSIS — C080 Malignant neoplasm of submandibular gland: Principal | ICD-10-CM

## 2020-06-04 MED ORDER — OLANZAPINE 7.5 MG TABLET
ORAL_TABLET | 0 refills | 0 days | Status: CP
Start: 2020-06-04 — End: ?

## 2020-06-09 DIAGNOSIS — C080 Malignant neoplasm of submandibular gland: Principal | ICD-10-CM

## 2020-06-09 MED ORDER — SILDENAFIL 50 MG TABLET
ORAL_TABLET | Freq: Every day | ORAL | 0 refills | 20 days | PRN
Start: 2020-06-09 — End: 2021-06-09

## 2020-06-09 MED ORDER — OXYCODONE 5 MG TABLET
ORAL_TABLET | Freq: Four times a day (QID) | ORAL | 0 refills | 3.00000 days | Status: CP | PRN
Start: 2020-06-09 — End: 2020-06-19

## 2020-06-10 ENCOUNTER — Ambulatory Visit
Admission: RE | Admit: 2020-06-10 | Discharge: 2020-06-10 | Disposition: A | Discharge status: Home / Self Care | Payer: Self-pay | Source: Ambulatory Visit | Attending: Radiation Oncology | Admitting: Radiation Oncology

## 2020-06-10 ENCOUNTER — Other Ambulatory Visit: Payer: Self-pay | Admitting: Radiation Oncology

## 2020-06-10 DIAGNOSIS — C080 Malignant neoplasm of submandibular gland: Principal | ICD-10-CM

## 2020-06-10 DIAGNOSIS — C3492 Malignant neoplasm of unspecified part of left bronchus or lung: Secondary | ICD-10-CM

## 2020-06-10 MED ORDER — OXYCODONE 5 MG TABLET
ORAL_TABLET | Freq: Four times a day (QID) | ORAL | 0 refills | 30 days | Status: CP | PRN
Start: 2020-06-10 — End: 2020-06-20

## 2020-06-11 NOTE — Progress Notes (Signed)
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  Stable sub-centimeter right level IIb lymph node.  CT abdomen/Pelvis 05/27/2020: No evidence of metastatic disease in the abdomen or pelvis.  CT Chest 05/27/2020: Progression of disease as evidenced by interval increase in size of pulmonary nodules and masses.  CT soft tissue Neck 03/04/2020: Increase in size of right level 2A node.  CT abdomen/Pelvis 03/04/2020: Stable suspected hemangioma of liver, no new metastatic disease.  CT Chest 03/04/2020: Interval disease progression with worsening pulmonary, pleural and nodal metastasis.  Past/Anticipated Radiation by oncology, if any: Dr. Lynda Rainwater- [facility] 05/29/2020 -Recommend palliative intent RT to left rib lesion. -Patient lives in [place] and prefers treatment closer to home.   -We will submit a referral to [place]. -He describes pain in the mid to lower thorax, medial to axillary midline and notes that he can palpate a raised lesion at this site.  Past/Anticipated chemotherapy by medical oncology, if any:  Dr. Othelia Pulling 05/27/2020 - Stage IV ACC, progressive, s/p 2C CbP/Vin with progression, changed to pembro/levantinib with POD on imaging today. -We discussed that it's hard to say if this is true progression on pembro, as he only received 1 dose prior to repeat imaging. -Given lack of other efficacious alternatives, it would be reasonable to continue with pembro at this time and con also continue with lenvantinib in this context. -We also discussed alternative options including phase 1 clinical trials and transitioning GOC to focus  on comfort and discussed hospice care. -At present pt would like to remain on treatment and would like to continue with pembro/levantinib. - He is also open to potential phase 1 options, but expressed that he is unable to travel far due to lack of financial means. -Pt asked about prognosis, discussed this is impossible to know, but could be over a year (but also possibly less than that). -Worsening severe left rib pain.  Will start prn oxycodone and refer to RadOnc for possible palliative RT.   Pain on a scale of 0-10 is: 2/10 at this time.  Ambulatory status? Walker? Wheelchair?: ambulatory  SAFETY ISSUES:  Prior radiation? No  Pacemaker/ICD? No  Possible current pregnancy? n/a  Is the patient on methotrexate? No  Current Complaints / other details:   History -2016: Diagnosed with acinic cell carcinoma. -07/12/2014: Resection of right submandibular tumor, adenoid cystic carcinoma, +PNI, grade I/III, pT2NXMX -08/12/2014: Right limited radical neck dissection (pT2N0MX), Adenoid cystic carcinoma. -07/09/2016: Resection of local relapse at right neck (rpT2NXM1) -08/06/2016: CT Chest showed new onset of bilateral lung nodules. -03/25/2017: Biopsy LLL lung nodule, metastatic ACC -05/16/17-10/12/17: Cisplatin, cyclophosphamide, doxorubicin x6 cycles (stable disease)  -03/09/18-06/09/2018: Lenvatinib 24mg  PO QD (progression on CT of the chest noted 06/08/2018).  Last dose lenvatinib 06/09/2018. -01/22/2020-02/19/2020: Transitioned care to [facility] with Dr. Lennart Pall (pre[facility] at [facility]).  Systemic therapy with carboplatin/vinorelbine (2cycles) -04/15/2020: Systemic therapy transitioned to pembrolizumab.  Also started lenvatinib.

## 2020-06-12 ENCOUNTER — Encounter: Payer: Self-pay | Admitting: Radiation Oncology

## 2020-06-12 ENCOUNTER — Ambulatory Visit
Admission: RE | Admit: 2020-06-12 | Discharge: 2020-06-12 | Disposition: A | Discharge status: Home / Self Care | Payer: Medicaid Other | Source: Ambulatory Visit | Attending: Radiation Oncology | Admitting: Radiation Oncology

## 2020-06-12 ENCOUNTER — Other Ambulatory Visit: Payer: Self-pay

## 2020-06-12 VITALS — BP 119/90 | HR 77 | Temp 97.4°F | Resp 18 | Ht 72.0 in | Wt 191.2 lb

## 2020-06-12 DIAGNOSIS — C7951 Secondary malignant neoplasm of bone: Secondary | ICD-10-CM

## 2020-06-12 DIAGNOSIS — G620 Drug-induced polyneuropathy: Secondary | ICD-10-CM

## 2020-06-12 DIAGNOSIS — M129 Arthropathy, unspecified: Secondary | ICD-10-CM | POA: Diagnosis not present

## 2020-06-12 DIAGNOSIS — T451X5A Adverse effect of antineoplastic and immunosuppressive drugs, initial encounter: Secondary | ICD-10-CM

## 2020-06-12 DIAGNOSIS — F339 Major depressive disorder, recurrent, unspecified: Secondary | ICD-10-CM

## 2020-06-12 DIAGNOSIS — C111 Malignant neoplasm of posterior wall of nasopharynx: Secondary | ICD-10-CM | POA: Diagnosis not present

## 2020-06-12 DIAGNOSIS — Z87891 Personal history of nicotine dependence: Secondary | ICD-10-CM | POA: Diagnosis not present

## 2020-06-12 DIAGNOSIS — Z87442 Personal history of urinary calculi: Secondary | ICD-10-CM | POA: Insufficient documentation

## 2020-06-12 DIAGNOSIS — R569 Unspecified convulsions: Secondary | ICD-10-CM

## 2020-06-12 DIAGNOSIS — K219 Gastro-esophageal reflux disease without esophagitis: Secondary | ICD-10-CM | POA: Diagnosis not present

## 2020-06-12 DIAGNOSIS — C080 Malignant neoplasm of submandibular gland: Secondary | ICD-10-CM

## 2020-06-12 DIAGNOSIS — C7800 Secondary malignant neoplasm of unspecified lung: Secondary | ICD-10-CM

## 2020-06-12 NOTE — Progress Notes (Signed)
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 Diagnoses of Malignant neoplasm metastatic to lung, unspecified laterality ([place]) and 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[place]) were also pertinent to this visit.   HISTORY OF PRESENT ILLNESS: [name] is a 45 y.o. male seen at the request of Dr. Phoebe Sharps at [facility] in radiation oncology for a histoy of recucrrent metastatic cystic adenoid  carcinoma of the mandible now involving the rib.  The patient was apparently diagnosed with Adenocystic carcinoma in 2016 arising in the mandible, he underwent radical neck dissection and received systemic therapy and this was felt to be in remission until he progressed with disease locally in the neck as well as in the lungs in 2018.  He has been on several courses of systemic therapy his most recent with pembrolizumab and levatinib, recent imaging showed concerns for possible progression in the chest, additionally he has had imaging that confirms concern for disease involving the rib.  He was counseled on the rationale for considering an occult trial or evaluation at [facility] but is unable to get there.  He was also seen by radiation oncology, to consider palliative radiotherapy to the painful rib lesion.  He prefers treatment closer to home and is seen today to discuss options of palliative radiation to the rib.  He plans to continue pembrolizumab and levatinib.     PREVIOUS RADIATION THERAPY: No   PAST MEDICAL HISTORY:  Past Medical History:  Diagnosis Date  . Arthritis   . Cancer ([place])   . Carcinoma ([place]) 07/2014   adenoid cystic  . Depression   . GERD (gastroesophageal reflux  disease)    otc  . History of kidney stones   . History of stress test    workup done for syncope   . 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 Syncopal episodes   . Tinnitus of both ears        PAST SURGICAL HISTORY: Past Surgical History:  Procedure Laterality Date  . IR FLUORO GUIDE PORT INSERTION RIGHT  05/05/2017  . IR REMOVAL TUN ACCESS W/ PORT W/O FL MOD SED  11/30/2017  . IR US GUIDE VASC ACCESS RIGHT  05/05/2017  . LUMBAR LAMINECTOMY/DECOMPRESSION MICRODISCECTOMY N/A 11/12/2016   Procedure: MICRODISCECTOMY RIGHT L5-S1;  Surgeon: Jessy Oto, MD;  Location: [place];  Service: Orthopedics;  Laterality: N/A;  . LUNG REMOVAL, PARTIAL    . NECK SURGERY    . none    . SUBMANDIBULAR GLAND EXCISION Right 20/25/4270   DR Erik Obey  . SUBMANDIBULAR GLAND EXCISION Right 07/12/2014   Procedure: EXCISION RIGHT SUBMANDIBULAR GLAND;  Surgeon: [name], MD;  Location: [place];  Service: ENT;  Laterality: Right;  . SUBMANDIBULAR GLAND EXCISION Right 08/12/2014   Procedure: Selected neck dissection;  Surgeon: [name], MD;  Location: [place];  Service: ENT;  Laterality: Right;     FAMILY HISTORY:  Family History  Adopted: Yes  Family history unknown: Yes     SOCIAL HISTORY:  reports that he quit smoking about 9 years ago. He has a 4.50 pack-year smoking history. He has quit using smokeless tobacco.  His smokeless tobacco use included chew.  He reports previous alcohol use. He reports previous drug use. Drug: Marijuana.  Patient is single but in a relationship.  He lives in [place].   ALLERGIES: Hydrocodone   MEDICATIONS:  Current Outpatient Medications  Medication Sig Dispense Refill  . gabapentin (NEURONTIN) 300 MG capsule Increase to 2 pills at night for three days then increase to 3 pills at night. Continue 1 pill morning and mid day.    . lenvatinib 20 mg daily dose (LENVIMA, 20 MG DAILY DOSE,) 2 x 10 MG capsule Take by mouth.    Marland Kitchen LORazepam (ATIVAN) 1 MG tablet Take 0.5 mg by mouth  every 8 (eight) hours as needed.    Marland Kitchen OLANZapine (ZYPREXA) 7.5 MG tablet Take 7.5 mg by mouth at bedtime.    Marland Kitchen oxyCODONE (OXY IR/ROXICODONE) 5 MG immediate release tablet Take by mouth.    . sildenafil (VIAGRA) 50 MG tablet Take by mouth.     No current facility-administered medications for this encounter.     REVIEW OF SYSTEMS: On review of systems, the patient reports that he is having a really difficult time getting comfortable with pain in his left chest wall not far from sites he's had previous chest tube incision/AP window incision. He feels like he is able to palpate the location along the side wall of the chest and deep breathing, laying on his side, or even movement can aggravate his symptoms. He has been taking gabapentin without significant relief as well as OTC Aleeve, and oxycodone only at night time. He denies any shortness of breath or chest pain. He does not have regular coughing or productive mucous/hemoptysis. No other complaints are verbalized. He does state he is hoping that with this treatment that he can have some relief so he can have quality of life and will enroll in hospice care following his treatment.     PHYSICAL EXAM:  Wt Readings from Last 3 Encounters:  06/12/20 191 lb 4 oz (86.8 kg)  09/13/19 185 lb (83.9 kg)  10/06/18 202 lb 6.4 oz (91.8 kg)   Temp Readings from Last 3 Encounters:  06/12/20 (!) 97.4 F (36.3 C) (Temporal)  09/13/19 98.3 F (36.8 C) (Oral)  10/06/18 98.2 F (36.8 C) (Oral)   BP Readings from Last 3 Encounters:  06/12/20 119/90  09/13/19 113/86  10/06/18 125/83   Pulse Readings from Last 3 Encounters:  06/12/20 77  09/13/19 62  10/06/18 74   Pain Assessment Pain Score: 2  Pain Loc: Rib Cage (Left Side)/10  In general this is a well appearing caucasian male in no acute distress. He's alert and oriented x4 and appropriate throughout the examination. Cardiopulmonary assessment is negative for acute distress and he exhibits normal  effort. He points to fullness along the left lateral rib. He has prior AP window and chest tube incision sites that are well healed flanking the area of his symptoms. No skin lesions however are noted.   ECOG = 1  0 - Asymptomatic (Fully active, able to carry on all predisease activities without restriction)  1 - Symptomatic but completely ambulatory (Restricted in physically strenuous activity but ambulatory and able to carry out work of a light or sedentary nature. For example, light housework, office work)  2 - Symptomatic, <50% in bed during the day (Ambulatory and capable of all self care but unable to carry out any work activities. Up and about more than 50% of waking hours)  3 - Symptomatic, >50% in bed, but not bedbound (Capable of only  limited self-care, confined to bed or chair 50% or more of waking hours)  4 - Bedbound (Completely disabled. Cannot carry on any self-care. Totally confined to bed or chair)  5 - Death   Eustace Pen MM, Creech RH, Tormey DC, et al. [phone]). "Toxicity and response criteria of the [facility]". [place] Oncol. 5 (6): 649-55    LABORATORY DATA:  Lab Results  Component Value Date   WBC 6.4 09/13/2019   HGB 13.3 09/13/2019   HCT 40.7 09/13/2019   MCV 83.6 09/13/2019   PLT 256 09/13/2019   Lab Results  Component Value Date   NA 139 09/13/2019   K 3.8 09/13/2019   CL 104 09/13/2019   CO2 26 09/13/2019   Lab Results  Component Value Date   ALT 25 10/06/2018   AST 24 10/06/2018   ALKPHOS 88 10/06/2018   BILITOT 0.6 10/06/2018      RADIOGRAPHY: No results found.     IMPRESSION/PLAN: 1. Recurrent metastatic adenoid cystic carcinoma originally arising in the head and neck region with metastatic disease to the lungs involving the pleura and chest wall.  Dr. Lisbeth Renshaw reviews the images as well as his previous treatment notes from medical and radiation oncology at [facility].  He discusses the concerns for the patient's  findings on personal review of his imaging of the chest as there appears to be a significant burden of disease along the chest wall from the pleural space.  While no rib fractures are documented in the radiology report, it appears that the patient is having pain associated with his pleural-based disease that is involving the chest wall.  Dr. Lisbeth Renshaw discussed the imaging findings with the patient and recommends palliative radiotherapy to the left chest wall.  Dr. Lisbeth Renshaw recommends proceeding with 2 weeks of palliative radiotherapy.  We reviewed the risks, benefits, short and long-term effects of radiotherapy and the patient is interested in proceeding.  Written consent is obtained and a copy is provided to the patient and a copy placed in his chart.  He will return tomorrow for simulation and begin radiotherapy early next week. 2. Pain secondary to #1.  Patient currently is taking oxycodone, gabapentin, and aleve for pain. We are hopeful that radiotherapy will be helpful for his symptoms, but he's encouraged to contact is [place] if he needs more direction on the oxycodone before the relief of radiation would be expected.  In a visit lasting 60 minutes, greater than 50% of the time was spent face to face discussing the patient's condition, in preparation for the discussion, and coordinating the patient's care.   The above documentation reflects my direct findings during this shared patient visit. Please see the separate note by Dr. Lisbeth Renshaw on this date for the remainder of the patient's plan of care.    Carola Rhine, PAC

## 2020-06-13 ENCOUNTER — Ambulatory Visit
Admission: RE | Admit: 2020-06-13 | Discharge: 2020-06-13 | Disposition: A | Discharge status: Home / Self Care | Payer: Medicaid Other | Source: Ambulatory Visit | Attending: Radiation Oncology | Admitting: Radiation Oncology

## 2020-06-13 ENCOUNTER — Other Ambulatory Visit: Payer: Self-pay

## 2020-06-13 DIAGNOSIS — C7800 Secondary malignant neoplasm of unspecified lung: Secondary | ICD-10-CM | POA: Insufficient documentation

## 2020-06-18 ENCOUNTER — Ambulatory Visit
Admission: RE | Admit: 2020-06-18 | Discharge: 2020-06-18 | Disposition: A | Discharge status: Home / Self Care | Payer: Medicaid Other | Source: Ambulatory Visit | Attending: Radiation Oncology | Admitting: Radiation Oncology

## 2020-06-18 DIAGNOSIS — C7800 Secondary malignant neoplasm of unspecified lung: Secondary | ICD-10-CM | POA: Diagnosis not present

## 2020-06-19 ENCOUNTER — Ambulatory Visit
Admission: RE | Admit: 2020-06-19 | Discharge: 2020-06-19 | Disposition: A | Discharge status: Home / Self Care | Payer: Medicaid Other | Source: Ambulatory Visit | Attending: Radiation Oncology | Admitting: Radiation Oncology

## 2020-06-19 DIAGNOSIS — C7800 Secondary malignant neoplasm of unspecified lung: Secondary | ICD-10-CM | POA: Diagnosis not present

## 2020-06-20 ENCOUNTER — Other Ambulatory Visit: Payer: Self-pay

## 2020-06-20 ENCOUNTER — Ambulatory Visit
Admission: RE | Admit: 2020-06-20 | Discharge: 2020-06-20 | Disposition: A | Discharge status: Home / Self Care | Payer: Medicaid Other | Source: Ambulatory Visit | Attending: Radiation Oncology | Admitting: Radiation Oncology

## 2020-06-20 DIAGNOSIS — C7800 Secondary malignant neoplasm of unspecified lung: Secondary | ICD-10-CM | POA: Diagnosis not present

## 2020-06-23 ENCOUNTER — Ambulatory Visit
Admission: RE | Admit: 2020-06-23 | Discharge: 2020-06-23 | Disposition: A | Discharge status: Home / Self Care | Payer: Medicaid Other | Source: Ambulatory Visit | Attending: Radiation Oncology | Admitting: Radiation Oncology

## 2020-06-23 ENCOUNTER — Other Ambulatory Visit: Payer: Self-pay

## 2020-06-23 DIAGNOSIS — C7800 Secondary malignant neoplasm of unspecified lung: Secondary | ICD-10-CM | POA: Diagnosis not present

## 2020-06-24 ENCOUNTER — Ambulatory Visit
Admission: RE | Admit: 2020-06-24 | Discharge: 2020-06-24 | Disposition: A | Discharge status: Home / Self Care | Payer: Medicaid Other | Source: Ambulatory Visit | Attending: Radiation Oncology | Admitting: Radiation Oncology

## 2020-06-24 DIAGNOSIS — C7800 Secondary malignant neoplasm of unspecified lung: Secondary | ICD-10-CM | POA: Diagnosis not present

## 2020-06-25 ENCOUNTER — Ambulatory Visit
Admission: RE | Admit: 2020-06-25 | Discharge: 2020-06-25 | Disposition: A | Discharge status: Home / Self Care | Payer: Medicaid Other | Source: Ambulatory Visit | Attending: Radiation Oncology | Admitting: Radiation Oncology

## 2020-06-25 DIAGNOSIS — C7800 Secondary malignant neoplasm of unspecified lung: Secondary | ICD-10-CM | POA: Diagnosis not present

## 2020-06-26 ENCOUNTER — Other Ambulatory Visit: Payer: Self-pay

## 2020-06-26 ENCOUNTER — Ambulatory Visit
Admission: RE | Admit: 2020-06-26 | Discharge: 2020-06-26 | Disposition: A | Discharge status: Home / Self Care | Payer: Medicaid Other | Source: Ambulatory Visit | Attending: Radiation Oncology | Admitting: Radiation Oncology

## 2020-06-26 DIAGNOSIS — C7800 Secondary malignant neoplasm of unspecified lung: Secondary | ICD-10-CM | POA: Diagnosis not present

## 2020-06-27 ENCOUNTER — Other Ambulatory Visit: Payer: Self-pay

## 2020-06-27 ENCOUNTER — Ambulatory Visit
Admission: RE | Admit: 2020-06-27 | Discharge: 2020-06-27 | Disposition: A | Discharge status: Home / Self Care | Payer: Medicaid Other | Source: Ambulatory Visit | Attending: Radiation Oncology | Admitting: Radiation Oncology

## 2020-06-27 DIAGNOSIS — C7800 Secondary malignant neoplasm of unspecified lung: Secondary | ICD-10-CM | POA: Diagnosis not present

## 2020-06-30 ENCOUNTER — Ambulatory Visit
Admission: RE | Admit: 2020-06-30 | Discharge: 2020-06-30 | Disposition: A | Discharge status: Home / Self Care | Payer: Medicaid Other | Source: Ambulatory Visit | Attending: Radiation Oncology | Admitting: Radiation Oncology

## 2020-06-30 ENCOUNTER — Other Ambulatory Visit: Payer: Self-pay

## 2020-06-30 DIAGNOSIS — C7800 Secondary malignant neoplasm of unspecified lung: Secondary | ICD-10-CM | POA: Diagnosis not present

## 2020-07-01 ENCOUNTER — Ambulatory Visit
Admission: RE | Admit: 2020-07-01 | Discharge: 2020-07-01 | Disposition: A | Discharge status: Home / Self Care | Payer: Medicaid Other | Source: Ambulatory Visit | Attending: Radiation Oncology | Admitting: Radiation Oncology

## 2020-07-01 ENCOUNTER — Other Ambulatory Visit: Payer: Self-pay

## 2020-07-01 ENCOUNTER — Encounter: Payer: Self-pay | Admitting: Radiation Oncology

## 2020-07-01 DIAGNOSIS — C7800 Secondary malignant neoplasm of unspecified lung: Secondary | ICD-10-CM | POA: Diagnosis not present

## 2020-07-03 NOTE — Progress Notes (Signed)
  Patient Name: [name] MRN: [id number] DOB: 12-08-1975 Referring Physician: Aundra Millet (Profile Not Attached) Date of Service: 07/01/2020 [facility] [place], [place]                                                        End Of Treatment Note  Diagnoses: C79.51-Secondary malignant neoplasm of bone C80.1-Malignant (primary) neoplasm, unspecified  Cancer Staging: Recurrent metastatic adenoid cystic carcinoma originally arising in the head and neck region with metastatic disease to the lungs involving the pleura and chest wall.  Intent: Palliative  Radiation Treatment Dates: 06/18/2020 through 07/01/2020 Site Technique Total Dose (Gy) Dose per Fx (Gy) Completed Fx Beam Energies  Ribs, Left: Chest_Lt 3D 30/30 3 10/10 6X, 10X   Narrative: The patient tolerated radiation therapy relatively well without complaints of treatment side effects.   Plan: The patient will receive a call in about one month from the radiation oncology department. He will continue follow up [facility] medical [facility] ________________________________________________    Carola Rhine, PAC

## 2020-07-08 ENCOUNTER — Encounter
Admit: 2020-07-08 | Discharge: 2020-07-08 | Payer: PRIVATE HEALTH INSURANCE | Attending: Hematology & Oncology | Primary: Hematology & Oncology

## 2020-07-08 ENCOUNTER — Encounter: Admit: 2020-07-08 | Discharge: 2020-07-08 | Payer: PRIVATE HEALTH INSURANCE

## 2020-07-08 DIAGNOSIS — C080 Malignant neoplasm of submandibular gland: Principal | ICD-10-CM

## 2020-07-08 MED ORDER — OLANZAPINE 7.5 MG TABLET
ORAL_TABLET | 0 refills | 0 days | Status: CP
Start: 2020-07-08 — End: ?

## 2020-07-08 MED ORDER — MORPHINE 15 MG IMMEDIATE RELEASE TABLET
ORAL_TABLET | Freq: Two times a day (BID) | ORAL | 0 refills | 30 days | Status: CP
Start: 2020-07-08 — End: 2021-07-08

## 2020-07-28 ENCOUNTER — Ambulatory Visit
Admission: RE | Admit: 2020-07-28 | Discharge: 2020-07-28 | Disposition: A | Discharge status: Home / Self Care | Payer: Medicaid Other | Source: Ambulatory Visit | Attending: Radiation Oncology | Admitting: Radiation Oncology

## 2020-07-28 ENCOUNTER — Other Ambulatory Visit: Payer: Self-pay

## 2020-07-28 ENCOUNTER — Telehealth: Payer: Self-pay

## 2020-07-28 DIAGNOSIS — C080 Malignant neoplasm of submandibular gland: Secondary | ICD-10-CM

## 2020-07-28 NOTE — Progress Notes (Signed)
One month post treatment telephone call. Patient denies having any pain or fatigue. Patient denies issue with nutrition or swallowing. Patient states that he is currently on hospice care and following up with the hospice physician.

## 2020-07-28 NOTE — Telephone Encounter (Signed)
Spoke with patient in regards to 1 month post treatment call. Patient states that he is currently on hospice care. TM

## 2021-03-10 DEATH — deceased
